# Patient Record
Sex: Male | Born: 1970 | Race: Black or African American | Hispanic: No | Marital: Married | Smoking: Former smoker
Health system: Southern US, Community
[De-identification: ages and names within clinical notes are randomized; demographics above are authoritative.]

## PROBLEM LIST (undated history)

## (undated) DIAGNOSIS — M25562 Pain in left knee: Secondary | ICD-10-CM

## (undated) DIAGNOSIS — E079 Disorder of thyroid, unspecified: Secondary | ICD-10-CM

## (undated) DIAGNOSIS — G4733 Obstructive sleep apnea (adult) (pediatric): Secondary | ICD-10-CM

## (undated) DIAGNOSIS — E039 Hypothyroidism, unspecified: Secondary | ICD-10-CM

## (undated) DIAGNOSIS — K5792 Diverticulitis of intestine, part unspecified, without perforation or abscess without bleeding: Secondary | ICD-10-CM

## (undated) DIAGNOSIS — E669 Obesity, unspecified: Secondary | ICD-10-CM

## (undated) DIAGNOSIS — A630 Anogenital (venereal) warts: Secondary | ICD-10-CM

## (undated) DIAGNOSIS — M5136 Other intervertebral disc degeneration, lumbar region: Secondary | ICD-10-CM

## (undated) DIAGNOSIS — I219 Acute myocardial infarction, unspecified: Secondary | ICD-10-CM

## (undated) DIAGNOSIS — I10 Essential (primary) hypertension: Secondary | ICD-10-CM

## (undated) DIAGNOSIS — H548 Legal blindness, as defined in USA: Secondary | ICD-10-CM

## (undated) DIAGNOSIS — M25561 Pain in right knee: Secondary | ICD-10-CM

## (undated) DIAGNOSIS — R569 Unspecified convulsions: Secondary | ICD-10-CM

## (undated) DIAGNOSIS — G5600 Carpal tunnel syndrome, unspecified upper limb: Secondary | ICD-10-CM

## (undated) DIAGNOSIS — M51369 Other intervertebral disc degeneration, lumbar region without mention of lumbar back pain or lower extremity pain: Secondary | ICD-10-CM

## (undated) DIAGNOSIS — I2510 Atherosclerotic heart disease of native coronary artery without angina pectoris: Secondary | ICD-10-CM

## (undated) DIAGNOSIS — K219 Gastro-esophageal reflux disease without esophagitis: Secondary | ICD-10-CM

## (undated) DIAGNOSIS — E785 Hyperlipidemia, unspecified: Secondary | ICD-10-CM

## (undated) DIAGNOSIS — Z720 Tobacco use: Secondary | ICD-10-CM

## (undated) DIAGNOSIS — E559 Vitamin D deficiency, unspecified: Secondary | ICD-10-CM

## (undated) HISTORY — DX: Carpal tunnel syndrome, unspecified upper limb: G56.00

## (undated) HISTORY — DX: Gastro-esophageal reflux disease without esophagitis: K21.9

## (undated) HISTORY — DX: Obesity, unspecified: E66.9

## (undated) HISTORY — DX: Other intervertebral disc degeneration, lumbar region without mention of lumbar back pain or lower extremity pain: M51.369

## (undated) HISTORY — DX: Pain in right knee: M25.561

## (undated) HISTORY — DX: Hyperlipidemia, unspecified: E78.5

## (undated) HISTORY — DX: Vitamin D deficiency, unspecified: E55.9

## (undated) HISTORY — DX: Essential (primary) hypertension: I10

## (undated) HISTORY — DX: Legal blindness, as defined in USA: H54.8

## (undated) HISTORY — DX: Unspecified convulsions: R56.9

## (undated) HISTORY — DX: Acute myocardial infarction, unspecified: I21.9

## (undated) HISTORY — DX: Obstructive sleep apnea (adult) (pediatric): G47.33

## (undated) HISTORY — DX: Anogenital (venereal) warts: A63.0

## (undated) HISTORY — DX: Atherosclerotic heart disease of native coronary artery without angina pectoris: I25.10

## (undated) HISTORY — DX: Disorder of thyroid, unspecified: E07.9

## (undated) HISTORY — PX: KNEE SURGERY: SHX244

## (undated) HISTORY — DX: Other intervertebral disc degeneration, lumbar region: M51.36

## (undated) HISTORY — DX: Tobacco use: Z72.0

## (undated) HISTORY — DX: Pain in left knee: M25.562

---

## 1999-09-16 ENCOUNTER — Inpatient Hospital Stay (HOSPITAL_COMMUNITY): Admission: EM | Admit: 1999-09-16 | Discharge: 1999-09-25 | Payer: Self-pay | Admitting: Emergency Medicine

## 1999-09-16 ENCOUNTER — Encounter: Payer: Self-pay | Admitting: Emergency Medicine

## 1999-09-17 ENCOUNTER — Encounter: Payer: Self-pay | Admitting: Internal Medicine

## 1999-09-18 ENCOUNTER — Encounter: Payer: Self-pay | Admitting: Internal Medicine

## 1999-09-19 ENCOUNTER — Encounter: Payer: Self-pay | Admitting: Internal Medicine

## 1999-09-21 ENCOUNTER — Encounter: Payer: Self-pay | Admitting: Internal Medicine

## 1999-09-28 ENCOUNTER — Encounter: Admission: RE | Admit: 1999-09-28 | Discharge: 1999-09-28 | Payer: Self-pay | Admitting: Family Medicine

## 1999-10-01 ENCOUNTER — Encounter: Admission: RE | Admit: 1999-10-01 | Discharge: 1999-10-01 | Payer: Self-pay | Admitting: Family Medicine

## 1999-10-06 ENCOUNTER — Encounter: Admission: RE | Admit: 1999-10-06 | Discharge: 1999-10-06 | Payer: Self-pay | Admitting: Family Medicine

## 1999-12-30 ENCOUNTER — Encounter: Admission: RE | Admit: 1999-12-30 | Discharge: 1999-12-30 | Payer: Self-pay | Admitting: Family Medicine

## 1999-12-30 ENCOUNTER — Encounter: Admission: RE | Admit: 1999-12-30 | Discharge: 1999-12-30 | Payer: Self-pay | Admitting: *Deleted

## 1999-12-30 ENCOUNTER — Encounter: Payer: Self-pay | Admitting: *Deleted

## 2000-02-16 ENCOUNTER — Encounter: Admission: RE | Admit: 2000-02-16 | Discharge: 2000-02-16 | Payer: Self-pay | Admitting: Family Medicine

## 2000-05-13 ENCOUNTER — Encounter: Admission: RE | Admit: 2000-05-13 | Discharge: 2000-05-13 | Payer: Self-pay | Admitting: Family Medicine

## 2000-07-14 ENCOUNTER — Encounter: Admission: RE | Admit: 2000-07-14 | Discharge: 2000-07-14 | Payer: Self-pay | Admitting: Family Medicine

## 2000-09-12 ENCOUNTER — Inpatient Hospital Stay (HOSPITAL_COMMUNITY): Admission: EM | Admit: 2000-09-12 | Discharge: 2000-09-14 | Payer: Self-pay | Admitting: Emergency Medicine

## 2000-09-13 ENCOUNTER — Encounter: Payer: Self-pay | Admitting: *Deleted

## 2000-09-13 ENCOUNTER — Encounter: Payer: Self-pay | Admitting: Family Medicine

## 2000-10-26 ENCOUNTER — Encounter: Admission: RE | Admit: 2000-10-26 | Discharge: 2000-10-26 | Payer: Self-pay | Admitting: Family Medicine

## 2000-11-09 ENCOUNTER — Encounter: Admission: RE | Admit: 2000-11-09 | Discharge: 2000-11-09 | Payer: Self-pay | Admitting: Family Medicine

## 2001-02-27 ENCOUNTER — Encounter: Admission: RE | Admit: 2001-02-27 | Discharge: 2001-02-27 | Payer: Self-pay | Admitting: Family Medicine

## 2001-03-15 ENCOUNTER — Encounter: Admission: RE | Admit: 2001-03-15 | Discharge: 2001-03-15 | Payer: Self-pay | Admitting: Family Medicine

## 2001-06-26 ENCOUNTER — Encounter: Admission: RE | Admit: 2001-06-26 | Discharge: 2001-06-26 | Payer: Self-pay | Admitting: Family Medicine

## 2001-07-11 ENCOUNTER — Encounter: Admission: RE | Admit: 2001-07-11 | Discharge: 2001-07-11 | Payer: Self-pay | Admitting: Sports Medicine

## 2001-09-06 ENCOUNTER — Encounter: Admission: RE | Admit: 2001-09-06 | Discharge: 2001-09-06 | Payer: Self-pay | Admitting: Family Medicine

## 2001-10-18 ENCOUNTER — Encounter: Admission: RE | Admit: 2001-10-18 | Discharge: 2001-10-18 | Payer: Self-pay | Admitting: Family Medicine

## 2002-04-16 ENCOUNTER — Encounter: Admission: RE | Admit: 2002-04-16 | Discharge: 2002-04-16 | Payer: Self-pay | Admitting: Family Medicine

## 2003-03-20 ENCOUNTER — Encounter: Admission: RE | Admit: 2003-03-20 | Discharge: 2003-03-20 | Payer: Self-pay | Admitting: Family Medicine

## 2003-07-24 ENCOUNTER — Encounter: Admission: RE | Admit: 2003-07-24 | Discharge: 2003-07-24 | Payer: Self-pay | Admitting: Family Medicine

## 2003-11-21 ENCOUNTER — Encounter: Admission: RE | Admit: 2003-11-21 | Discharge: 2003-11-21 | Payer: Self-pay | Admitting: Family Medicine

## 2004-09-28 ENCOUNTER — Ambulatory Visit: Payer: Self-pay | Admitting: Family Medicine

## 2005-07-02 ENCOUNTER — Ambulatory Visit: Payer: Self-pay | Admitting: Family Medicine

## 2005-07-30 ENCOUNTER — Ambulatory Visit: Payer: Self-pay | Admitting: Family Medicine

## 2005-11-30 ENCOUNTER — Ambulatory Visit: Payer: Self-pay | Admitting: Family Medicine

## 2005-12-01 ENCOUNTER — Ambulatory Visit (HOSPITAL_COMMUNITY): Admission: RE | Admit: 2005-12-01 | Discharge: 2005-12-01 | Payer: Self-pay | Admitting: Sports Medicine

## 2005-12-01 ENCOUNTER — Ambulatory Visit: Payer: Self-pay | Admitting: *Deleted

## 2005-12-21 ENCOUNTER — Ambulatory Visit (HOSPITAL_COMMUNITY): Admission: RE | Admit: 2005-12-21 | Discharge: 2005-12-21 | Payer: Self-pay | Admitting: Family Medicine

## 2005-12-21 ENCOUNTER — Ambulatory Visit: Payer: Self-pay | Admitting: Sports Medicine

## 2006-06-23 DIAGNOSIS — G4730 Sleep apnea, unspecified: Secondary | ICD-10-CM | POA: Insufficient documentation

## 2006-06-23 DIAGNOSIS — K219 Gastro-esophageal reflux disease without esophagitis: Secondary | ICD-10-CM | POA: Insufficient documentation

## 2006-06-23 DIAGNOSIS — E785 Hyperlipidemia, unspecified: Secondary | ICD-10-CM | POA: Insufficient documentation

## 2006-06-23 DIAGNOSIS — I10 Essential (primary) hypertension: Secondary | ICD-10-CM | POA: Insufficient documentation

## 2006-06-23 DIAGNOSIS — M1710 Unilateral primary osteoarthritis, unspecified knee: Secondary | ICD-10-CM | POA: Insufficient documentation

## 2006-06-23 DIAGNOSIS — G5600 Carpal tunnel syndrome, unspecified upper limb: Secondary | ICD-10-CM | POA: Insufficient documentation

## 2006-06-23 DIAGNOSIS — I517 Cardiomegaly: Secondary | ICD-10-CM | POA: Insufficient documentation

## 2006-06-23 DIAGNOSIS — IMO0002 Reserved for concepts with insufficient information to code with codable children: Secondary | ICD-10-CM | POA: Insufficient documentation

## 2006-06-23 DIAGNOSIS — E669 Obesity, unspecified: Secondary | ICD-10-CM | POA: Insufficient documentation

## 2006-06-23 DIAGNOSIS — M6530 Trigger finger, unspecified finger: Secondary | ICD-10-CM | POA: Insufficient documentation

## 2006-08-15 ENCOUNTER — Telehealth: Payer: Self-pay | Admitting: *Deleted

## 2006-09-16 ENCOUNTER — Encounter: Payer: Self-pay | Admitting: *Deleted

## 2007-01-03 ENCOUNTER — Telehealth: Payer: Self-pay | Admitting: *Deleted

## 2007-02-23 ENCOUNTER — Ambulatory Visit: Payer: Self-pay | Admitting: Family Medicine

## 2007-02-23 ENCOUNTER — Encounter (INDEPENDENT_AMBULATORY_CARE_PROVIDER_SITE_OTHER): Payer: Self-pay | Admitting: *Deleted

## 2007-02-23 DIAGNOSIS — M109 Gout, unspecified: Secondary | ICD-10-CM | POA: Insufficient documentation

## 2007-02-23 DIAGNOSIS — F17200 Nicotine dependence, unspecified, uncomplicated: Secondary | ICD-10-CM | POA: Insufficient documentation

## 2007-02-23 DIAGNOSIS — Z720 Tobacco use: Secondary | ICD-10-CM | POA: Insufficient documentation

## 2007-02-24 LAB — CONVERTED CEMR LAB
ALT: 16 units/L (ref 0–53)
BUN: 10 mg/dL (ref 6–23)
CO2: 26 meq/L (ref 19–32)
Calcium: 9.2 mg/dL (ref 8.4–10.5)
Chloride: 102 meq/L (ref 96–112)
Cholesterol: 191 mg/dL (ref 0–200)
Creatinine, Ser: 1.13 mg/dL (ref 0.40–1.50)
Glucose, Bld: 80 mg/dL (ref 70–99)
Total CHOL/HDL Ratio: 5.8
Triglycerides: 157 mg/dL — ABNORMAL HIGH (ref <150)

## 2007-03-13 ENCOUNTER — Ambulatory Visit: Payer: Self-pay | Admitting: Family Medicine

## 2007-04-04 ENCOUNTER — Encounter (INDEPENDENT_AMBULATORY_CARE_PROVIDER_SITE_OTHER): Payer: Self-pay | Admitting: *Deleted

## 2007-04-04 ENCOUNTER — Ambulatory Visit: Payer: Self-pay | Admitting: Family Medicine

## 2007-04-04 DIAGNOSIS — A630 Anogenital (venereal) warts: Secondary | ICD-10-CM | POA: Insufficient documentation

## 2007-04-04 LAB — CONVERTED CEMR LAB

## 2007-04-07 ENCOUNTER — Telehealth (INDEPENDENT_AMBULATORY_CARE_PROVIDER_SITE_OTHER): Payer: Self-pay | Admitting: *Deleted

## 2007-04-07 ENCOUNTER — Encounter (INDEPENDENT_AMBULATORY_CARE_PROVIDER_SITE_OTHER): Payer: Self-pay | Admitting: *Deleted

## 2007-04-27 ENCOUNTER — Encounter: Payer: Self-pay | Admitting: Family Medicine

## 2007-05-08 ENCOUNTER — Encounter: Payer: Self-pay | Admitting: *Deleted

## 2007-08-18 ENCOUNTER — Encounter (INDEPENDENT_AMBULATORY_CARE_PROVIDER_SITE_OTHER): Payer: Self-pay | Admitting: *Deleted

## 2007-08-18 ENCOUNTER — Ambulatory Visit: Payer: Self-pay | Admitting: Family Medicine

## 2007-08-21 LAB — CONVERTED CEMR LAB
ALT: 16 units/L (ref 0–53)
CO2: 25 meq/L (ref 19–32)
Calcium: 9.2 mg/dL (ref 8.4–10.5)
Chloride: 105 meq/L (ref 96–112)
Creatinine, Ser: 1.13 mg/dL (ref 0.40–1.50)
Glucose, Bld: 82 mg/dL (ref 70–99)
Total Protein: 6.9 g/dL (ref 6.0–8.3)
Uric Acid, Serum: 8.8 mg/dL — ABNORMAL HIGH (ref 4.0–7.8)

## 2007-09-11 ENCOUNTER — Ambulatory Visit (HOSPITAL_COMMUNITY): Admission: RE | Admit: 2007-09-11 | Discharge: 2007-09-11 | Payer: Self-pay | Admitting: *Deleted

## 2007-09-11 ENCOUNTER — Encounter (INDEPENDENT_AMBULATORY_CARE_PROVIDER_SITE_OTHER): Payer: Self-pay | Admitting: *Deleted

## 2007-09-26 ENCOUNTER — Encounter (INDEPENDENT_AMBULATORY_CARE_PROVIDER_SITE_OTHER): Payer: Self-pay | Admitting: *Deleted

## 2007-10-02 ENCOUNTER — Encounter (INDEPENDENT_AMBULATORY_CARE_PROVIDER_SITE_OTHER): Payer: Self-pay | Admitting: *Deleted

## 2007-10-23 ENCOUNTER — Encounter: Payer: Self-pay | Admitting: *Deleted

## 2008-06-05 ENCOUNTER — Telehealth: Payer: Self-pay | Admitting: *Deleted

## 2010-01-13 ENCOUNTER — Encounter: Payer: Self-pay | Admitting: Family Medicine

## 2010-04-16 ENCOUNTER — Encounter: Payer: Self-pay | Admitting: Family Medicine

## 2010-05-26 NOTE — Letter (Signed)
Summary: rpc chart  rpc chart   Imported By: Curtis Sites 02/05/2010 15:26:43  _____________________________________________________________________  External Attachment:    Type:   Image     Comment:   External Document

## 2010-05-26 NOTE — Miscellaneous (Signed)
Summary: Changing Dx of CHF   Clinical Lists Changes  Problems: Changed problem from CHF, EJECTION FRACTION > OR = 50% (ICD-428.32) to CHRONIC DIASTOLIC HEART FAILURE (ICD-428.32)

## 2010-05-28 NOTE — Miscellaneous (Signed)
  Clinical Lists Changes  Problems: Removed problem of VISUAL DISTURBANCE NOS (ICD-368.9)

## 2010-09-11 NOTE — Discharge Summary (Signed)
[place]  Patient:    Mike Davenport, Mike Davenport                   MRN: [id number] Adm. Date:  [id number] Disc. Date: [id number] Attending:  Sanjuana Letters Dictator:   Zella Ball, M.D. CC:         Lyndee Leo. Janey Greaser, M.D., [facility]   Discharge Summary  DISCHARGE DIAGNOSES: 1. Hypertensive urgency. 2. Hypertension, essential. 3. History of congestive heart failure. 4. Obstructive sleep apnea. 5. Pulmonary hypertension secondary to #4.  DISCHARGE MEDICATIONS: 1. Lasix 80 mg b.i.d. 2. Lopressor 50 mg p.o. b.i.d. 3. Ramipril 10 mg p.o. q.d. 4. Cardizem CD 300 mg q.d.  SPECIAL INSTRUCTIONS:  Patient is to continue on his home BiPAP.  FOLLOW-UP:  The patient is to follow up at the [facility].  PRESENTING HISTORY:  This is a 40 year old African-American male with past medical history as above who was out walking on the day of presentation with his girlfriend when he began experiencing some chest pain.  He stopped walking and when he sat down the chest pain intensified, lasted about five to 10 minutes with shortness of breath and headache.  Chest pain was initially relieved with nitroglycerin but the patient continued to have headache on presentation to the ED.  The patient actually presented to [facility] first via ambulance and on presentation to [facility] he was found to have blood pressure of 225/125.  On presentation to [facility] the patient stated that he would like to come to [facility], therefore, he was transferred by ambulance to our care on a nitroprusside drip.  PHYSICAL EXAMINATION ON PRESENTATION:  GENERAL APPEARANCE:  This is an obese black male in mild distress.  VITAL SIGNS:  Afebrile at 96.9, blood pressure 153/87, respiratory rate 22, pulse 93, satting 96% on two liters.  HEENT:  Fundi were not well visualized.  LUNGS:  There was  good air exchange, no crackles, good respiratory effort.  CARDIOVASCULAR:  Regular rate and rhythm.  EXTREMITIES:  There was no edema.  NEUROLOGICAL:  Cranial nerves II-XII grossly intact.  Strength 5/5 throughout and normal tone.  INITIAL LABS:  Initial cardiac enzymes slightly elevated with relative index, CK 163, CK-MB 5.3, relative index 3.2, troponin I of 0.04.  Second set of enzymes performed in the ED as well.  Negative troponin I of 0.04.  Sodium 138, potassium 3.5, chloride 110, bicarb 26, BUN 14, creatinine 1.4, glucose 156.  White count 11.8, hemoglobin 16, platelets 223.  Liver enzymes were normal.  Chest x-ray showed pulmonary edema.  EKG showed normal sinus rhythm without any obvious ST elevations or depressions.  CT of the head was performed and was negative for bleed or for cerebral edema.  ASSESSMENT:  A 40 year old African-American male with a history of congestive heart failure and hypertension who had been off of his Lopressor, his cardiac medications for three days and presented with clinical picture concerning for hypertensive emergency/urgency.  Therefore, the patient was admitted for management of this event.  HOSPITAL COURSE:  #1 - HYPERTENSIVE URGENCY:  The patient was changed from nitroprusside to a labetalol drip while at [facility].  He seemed to respond to this quite well.  His blood pressures came under control and as tolerated, he was switched from the labetalol drip to an oral regimen similar to the one he went home on and his blood pressure  seemed to normalize with this.  The patients headache eventually went away. As stated above, he did not have any signs of cerebral edema by CT of the head and as his blood pressures did normalize, he did quite well.  #2 - PULMONARY EDEMA:  The patient was begun on IV Lasix q.6h., continuous pulse-ox and he also responded to this quite well.  He was able to wean down to room air, had  vigorous urine output and repeat chest x-ray the day after presentation was without pulmonary edema.  #3 - PROTEINURIA: The patient had a significant amount of proteinuria in his urine.  Secondary to this, autoimmune work-up was done on him and he had a UPEP and an SPEP performed, the results of which were pending at this time as well as 24-hour analysis for metanephrines to rule out the possibility of _______________  and those results were pending at the time of the patients discharge.  DISPOSITION:  The patient was discharged to home in good health and advised strongly not to run out of his medications as this event was likely precipitated by his not taking his Lopressor.  DD:  10/15/00 TD:  10/17/00 Job: [id number]

## 2010-09-11 NOTE — Discharge Summary (Signed)
[place]. [facility]  Patient:    Mike Davenport, Mike Davenport                   MRN: [id number] Adm. Date:  [id number] Disc. Date: [id number] Attending:  Madaline Guthrie Dictator:   Lyndee Leo. Foreman                           Discharge Summary  CHIEF COMPLAINT:  Mr. Betty is a 40 year old African-American male who came in with a chief complaint of dyspnea and increased fluid on his legs.  HISTORY OF PRESENT ILLNESS:  ______ -year-old male with above chief complaint who states problems have been ongoing for about one year.  The patient was seen four days ago in the emergency department in [place], [place], and was released back home.  The patient was not satisfied with his care there and, thus, decided to come to [facility].  The patient states he gets short of breath with any type of minimal exertion.  Over the last three weeks the patient has had worsening dyspnea on exertion.  The patients last known weight was 506; on June 25, 1999, it was 350.  The patient states he has to sleep sitting up.  He also does ______ quite frequently during the day.  The patient states his normal weight is 275-280 pounds.  PAST MEDICAL HISTORY: 1. Hypertension. 2. Sleep apnea. 3. Obesity. 4. Decreased vision in his left eye, which is now absent, which has occurred    over the last three to four months.  PAST SURGICAL HISTORY:  Orthopedic leg surgery bilaterally in his teen years because one leg was longer than the other.  MEDICATIONS: 1. Diltiazem 300 mg p.o. q.d. 2. Lasix 40 mg p.o. q.d. 3. Altace 5 mg p.o. q.d. 4. Atenolol 25 mg p.o. q.d.  ALLERGIES:  No known drug allergies.  FAMILY HISTORY:  Positive for hypertension and diabetes.  SOCIAL HISTORY:  Denies alcohol or drug use.  Does smoke one pack a day over the last 13 years.  The patient lives with his girlfriend.  Is unemployed secondary to his weight.  PHYSICAL EXAMINATION:  VITAL SIGNS:  Temperature 99.8,  respirations 26, pulse 102, blood pressure 164/97.  He is 92% on 2 L.  GENERAL:  The patient is sitting on the edge of the bed, dyspneic with exertion up to 5-10 feet in distance.  Answers questions appropriately.  Alert and oriented x 3.  HEENT:  Normocephalic, atraumatic.  Oropharynx is clear.  No erythema, no exudate.  Left eye:  No visual capability, only blurred shadows.  No nasal discharge.  CARDIOVASCULAR:  Very limited secondary to his obesity.  Faint heart sounds. No peripheral pulses secondary to increased size of extremities.  LUNGS:  Faint breath sounds.  Decreased aeration, however, very limited secondary to his size.  ABDOMEN:  Obese.  Decreased bowel sounds.  Nontender.  EXTREMITIES:  He has extreme edema in both lower legs bilaterally.  The patient has undergo a 150-pound weight gain over the last three months.  LABORATORY DATA:  Chest x-ray revealed CHF with mild edema.  EKG revealed biatrial enlargement.  No ST elevation.  ASSESSMENT:  [age]-year-old African-American male with morbid obesity and probable right heart failure with fluid overload.  PLAN:  Increased diuresis.  The patient also would probably benefit from physical therapy and rehabilitation.  Will get nutrition on board, check an echo for cardiac function, and also discuss possible  psychiatric issues with his obesity.  Will obtain an ophthalmology consult.  HOSPITAL COURSE:  Over the hospital course the patient had sustained symptoms with worrisome ABGs.  An initial ABG was obtained with a pH of 7.27, CO2 of 83.5, O2 72.9, bicarbonate 38.4, and 92% on 4 L.  The patient had a urine drug screen which was negative.  He ruled out from a cardiac standpoint.  The patients acidemia was felt to be secondary to hypoventilation of obesity, and the patient exhibited a metabolic compensation for this.  He was transferred to the stepdown unit for BiPAP at night and serial ABGs to keep close tabs on him  from a metabolic standpoint.  The patient was placed on Lovenox for increased risk for DVT.  Serial blood gases over hospitalization gradually improved.  The patient slowly returned back to normal levels with continuous BiPAP at night.  The patients last ABG before discharge revealed 7.38, 62, and 63 on room air.  The patient was discharged on September 26, 1999, with the following issues.  DISCHARGE DIAGNOSES: 1. Morbid obesity:  I spoke at length regarding this.  The patient was given    specific goals for weight loss and exercise.  Also contacted the obesity    surgeons at both [facility] and [facility] regarding possible stapling of the stomach if    behavior change is not able to get enough of his weight loss off. 2. Respiratory:  The patient will be placed on BiPAP at home and was told that    he needs to wear this every night. 3. Lack of primary medical doctor:  The patient is to follow up with    Dr. Janey Greaser in the family practice center. 4. History of hematuria over hospitalization:  I felt this was likely    secondary to some trauma induced by the Foley, as well as being placed on    anticoagulation.  Will continue to monitor this closely on an outpatient    basis. 5. The patient was placed on chronic treatment, given his considerable risk    for DVT and pulmonary embolus, given morbid obesity.  Coumadin levels will    be checked as an outpatient. 6. Financial:  The patient has Medicaid pending and may likely be a candidate    for disability secondary to his obesity.  However, we feel that    Mr. Habig would benefit most from getting his weight down and trying to    get back to work. 7. Ophthalmology:  Consult pending.  Was not done at the time of discharge,    and the patient will need to follow up after being discharged to evaluate    his eye situation.  DISCHARGE MEDICATIONS: 1. Lasix 120 mg p.o. t.i.d. 2. Cipro 500 mg p.o. b.i.d. to finish a seven-day course for a urinary tract     infection. 3. Coumadin 10 mg p.o. q.d. 4. K-Dur 40 mEq p.o. q.d. 5. Ramipril 10 mg p.o. q.d. 6. Metoprolol 50 mg p.o. b.i.d.  7. Diltiazem 300 mg p.o. q.d.  LABORATORY DATA:  The patient had an echocardiogram.  Left ventricle was mildly dilated.  There is anterior wall hypokinesis and lateral wall hypokinesis.  LV ejection fraction was 35-45%.  Left atrium mildly dilated. The patient had trace tricuspid regurgitation.  The study was limited, however, given the patients size. DD:  11/09/99 TD:  11/09/99 Job: [id number]

## 2010-09-11 NOTE — Procedures (Signed)
NAMEFLAY, GHOSH NO.:  [id number]   MEDICAL RECORD NO.:  [id number]          PATIENT TYPE:  OUT   LOCATION:  RAD                           FACILITY:  [facility]   PHYSICIAN:  Vida Roller, M.D.   DATE OF BIRTH:  November 03, 1970   DATE OF PROCEDURE:  12/01/2005  DATE OF DISCHARGE:                                  ECHOCARDIOGRAM   TAPE NUMBER:  [id number].   TAPE COUNT:  [id number].   This is a 40 year old man with congestive heart failure.  No previous echo  is available.  Technical quality of the study is somewhat difficult, poor  acoustic windows.   M-MODE TRACING:  The aorta is 37 mm.   Left atrium is 40 mm.   Septum is 13 mm.   Posterior wall is 13 mm.   Left ventricular diastolic dimension 45 mm.   Left ventricular systolic dimension 32 mm.   2-D AND DOPPLER IMAGING:  The left ventricle is normal size.  There is  preserved LV systolic function with an ejection fraction of 60-65%.  No wall  motion abnormalities are seen.  There is mild concentric left ventricular  hypertrophy.   The right ventricle appears to be normal size with normal systolic function.   Left atrium appeared to top normal in size.   There is no obvious valvular heart disease by Doppler profile.   There is no pericardial effusion.      Vida Roller, M.D.  Electronically Signed     JH/MEDQ  D:  12/01/2005  T:  12/01/2005  Job:  [id number]

## 2011-04-07 ENCOUNTER — Ambulatory Visit (INDEPENDENT_AMBULATORY_CARE_PROVIDER_SITE_OTHER): Payer: Medicare Other | Admitting: Family Medicine

## 2011-04-07 ENCOUNTER — Encounter: Payer: Self-pay | Admitting: Family Medicine

## 2011-04-07 DIAGNOSIS — I5030 Unspecified diastolic (congestive) heart failure: Secondary | ICD-10-CM

## 2011-04-07 DIAGNOSIS — R0989 Other specified symptoms and signs involving the circulatory and respiratory systems: Secondary | ICD-10-CM

## 2011-04-07 DIAGNOSIS — I509 Heart failure, unspecified: Secondary | ICD-10-CM

## 2011-04-07 DIAGNOSIS — M109 Gout, unspecified: Secondary | ICD-10-CM

## 2011-04-07 DIAGNOSIS — Z720 Tobacco use: Secondary | ICD-10-CM

## 2011-04-07 DIAGNOSIS — F17200 Nicotine dependence, unspecified, uncomplicated: Secondary | ICD-10-CM

## 2011-04-07 DIAGNOSIS — R0609 Other forms of dyspnea: Secondary | ICD-10-CM

## 2011-04-07 DIAGNOSIS — I10 Essential (primary) hypertension: Secondary | ICD-10-CM

## 2011-04-07 DIAGNOSIS — Z0000 Encounter for general adult medical examination without abnormal findings: Secondary | ICD-10-CM

## 2011-04-07 DIAGNOSIS — IMO0002 Reserved for concepts with insufficient information to code with codable children: Secondary | ICD-10-CM

## 2011-04-07 DIAGNOSIS — M1710 Unilateral primary osteoarthritis, unspecified knee: Secondary | ICD-10-CM

## 2011-04-07 DIAGNOSIS — I5032 Chronic diastolic (congestive) heart failure: Secondary | ICD-10-CM

## 2011-04-07 DIAGNOSIS — E785 Hyperlipidemia, unspecified: Secondary | ICD-10-CM

## 2011-04-07 DIAGNOSIS — E669 Obesity, unspecified: Secondary | ICD-10-CM

## 2011-04-07 DIAGNOSIS — G4730 Sleep apnea, unspecified: Secondary | ICD-10-CM

## 2011-04-07 LAB — CBC
MCHC: 33.8 g/dL (ref 30.0–36.0)
RDW: 15.5 % (ref 11.5–15.5)

## 2011-04-07 LAB — COMPREHENSIVE METABOLIC PANEL
Alkaline Phosphatase: 46 U/L (ref 39–117)
BUN: 10 mg/dL (ref 6–23)
Glucose, Bld: 83 mg/dL (ref 70–99)
Total Bilirubin: 0.4 mg/dL (ref 0.3–1.2)

## 2011-04-07 LAB — LIPID PANEL
HDL: 35 mg/dL — ABNORMAL LOW (ref >39)
LDL Cholesterol: 79 mg/dL (ref 0–99)
Total CHOL/HDL Ratio: 4.1 Ratio
Triglycerides: 155 mg/dL — ABNORMAL HIGH (ref <150)
VLDL: 31 mg/dL (ref 0–40)

## 2011-04-07 LAB — URIC ACID: Uric Acid, Serum: 8 mg/dL — ABNORMAL HIGH (ref 4.0–7.8)

## 2011-04-07 MED ORDER — NICOTINE POLACRILEX 4 MG MT GUM
4.0000 mg | CHEWING_GUM | OROMUCOSAL | Status: AC | PRN
Start: 2011-04-07 — End: 2011-05-07

## 2011-04-07 MED ORDER — NICOTINE 14 MG/24HR TD PT24: 1.0000 | MEDICATED_PATCH | TRANSDERMAL | Status: AC

## 2011-04-07 NOTE — Assessment & Plan Note (Signed)
Pt with multiple medical problems. Will perform secondary monitoring labs. Broached issue of importance of smoking cessation and proper diet. Pt has numerous medical problems that will need to be dealt with over the coming months.

## 2011-04-07 NOTE — Assessment & Plan Note (Signed)
Pt ready to quit. Will start on on nicoderm and nicorette.

## 2011-04-07 NOTE — Assessment & Plan Note (Signed)
Pt states that he is on allopurinol and prn colchicine. Will check uric acid today.

## 2011-04-07 NOTE — Assessment & Plan Note (Signed)
Checking Cr and K today. Waiting on meds from pharmacy to formally add to chart.

## 2011-04-07 NOTE — Assessment & Plan Note (Signed)
Stressed importance of weight loss. Checking lipids today.

## 2011-04-07 NOTE — Assessment & Plan Note (Signed)
Split night ordered to titrate CPAP settings.

## 2011-04-07 NOTE — Assessment & Plan Note (Signed)
Checking lipids today. Pending med info from pharmacy.

## 2011-04-07 NOTE — Progress Notes (Signed)
  Subjective:    Patient ID: Mike Davenport, male    DOB: 23-Feb-1971, 40 y.o.   MRN: [id number]  HPI 22 YOM with multiple medical issues including HTN, HLD, morbid obesity, diastolic HF, OSA here to reestablish care. Pt was last seen here in 2009.  Pt states that he recently moved back to the area from [place]. 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   HTN: Pt states that he has not been checking BPs daily. Pt denies any CP or SOB. Pt does report intermittent HA. Pt also reports high salt diet. Pt can remember that he is on lisinopril, but he is unsure of dose.   OSA: pt has a history of this. Pt states that he has not worn his CPAP for several months as settings are off. Last sleep study was greater than 5 years ago per pt.   HLD: pt is unsure if he is on medication for HLD. Pt does report eating a high fat diet, including eating multiple snacks from a vending machine at work on a daily basis.   Diastolic HF: Pt states that he was evaluated for this in [place]. Per pt, he had a "normal" ECHO/?cardicac catheterization. Denies any orthopnea, PND, or DOE. Pt has not been following weights. Pt also reports relatively high sodium diet.   Knee: Pt has a baseline hx/o chronic knee pain. Pt states that knee pain has been present since having bilateral knee surgery when he was 14. Pt states that he walks all day at work and pain seems to be exacerbated at end of working. Pt states that he has not tried to work on diet or weight loss for this. Pt has used naproxen with mild relief in sxs. Pt has not seen PT for this. Pain is mainly in knee joints bilaterally.   Gout: Pt also reports hx/o gout. Pt states that he is on allopurinol for this. Pt states that gout flares are mainly in ankles and are usually relieved with colchicine. Pt has used colchicine within the last year.   Pt is aware of some of his medicines but did not bring list today. Pt gave phone # of pharmacy to call.   Review  of Systems See HPI, otherwise 12 point ROS negatve.     Objective:   Physical Exam Gen: up in chair, NAD, morbidly obese  HEENT: NCAT,+ strabsimus involving L eye, TMs clear bilaterally, large neck girth CV: RRR, no murmurs auscultated PULM: CTAB, no wheezes, rales, rhoncii ABD: S/NT/+ bowel sounds  EXT: 2+ peripheral pulses , 1+ edema bilaterally  Assessment & Plan:

## 2011-04-07 NOTE — Patient Instructions (Signed)
BE SURE TO COME BACK TO SEE ME IN 1 MONTH Obesity Obesity is defined as having a body mass index (BMI) of 30 or more. To calculate your BMI divide your weight in pounds by your height in inches squared and multiply that product by 703. Major illnesses resulting from long-term obesity include:  Stroke.   Heart disease.   Diabetes.   Many cancers.   Arthritis.  Obesity also complicates recovery from many other medical problems.  CAUSES   A history of obesity in your parents.   Thyroid hormone imbalance.   Environmental factors such as excess calorie intake and physical inactivity.  TREATMENT  A healthy weight loss program includes:  A calorie restricted diet based on individual calorie needs.   Increased physical activity (exercise).  An exercise program is just as important as the right low-calorie diet.  Weight-loss medicines should be used only under the supervision of your physician. These medicines help, but only if they are used with diet and exercise programs. Medicines can have side effects including nervousness, nausea, abdominal pain, diarrhea, headache, drowsiness, and depression.  An unhealthy weight loss program includes:  Fasting.   Fad diets.   Supplements and drugs.  These choices do not succeed in long-term weight control.  HOME CARE INSTRUCTIONS  To help you make the needed dietary changes:   Exercise and perform physical activity as directed by your caregiver.   Keep a daily record of everything you eat. There are many free websites to help you with this. It may be helpful to measure your foods so you can determine if you are eating the correct portion sizes.   Use low-calorie cookbooks or take special cooking classes.   Avoid alcohol. Drink more water and drinks with no calories.   Take vitamins and supplements only as recommended by your caregiver.   Weight loss support groups, Registered Dieticians, counselors, and stress reduction education can  also be very helpful.  Document Released: 05/20/2004 Document Revised: 12/23/2010 Document Reviewed: 03/19/2007 [facility] Patient Information 2012 [place], [place].

## 2011-04-07 NOTE — Assessment & Plan Note (Signed)
Checking BNP today as a baseline. No signs of florid volume overload. Though pt is morbidly obese at baseline. As stated before, waiting on med list to assess need for any treatment changes. Pt may benefit from repeat ECHO in the next 3-6 months to better assess this.

## 2011-04-07 NOTE — Assessment & Plan Note (Signed)
Stressed weight loss and prn tylenol use. Will follow up in coming visits to discuss this issue more in depth.

## 2011-04-08 ENCOUNTER — Encounter: Payer: Self-pay | Admitting: Family Medicine

## 2011-05-05 ENCOUNTER — Other Ambulatory Visit: Payer: Self-pay | Admitting: Family Medicine

## 2011-05-05 ENCOUNTER — Telehealth: Payer: Self-pay | Admitting: Family Medicine

## 2011-05-05 MED ORDER — ASPIRIN EC 81 MG PO TBEC: 81.0000 mg | DELAYED_RELEASE_TABLET | Freq: Every day | ORAL | Status: AC

## 2011-05-05 MED ORDER — SPIRONOLACTONE 25 MG PO TABS: 25.0000 mg | ORAL_TABLET | Freq: Every day | ORAL | Status: DC

## 2011-05-05 MED ORDER — METOPROLOL TARTRATE 100 MG PO TABS: 100.0000 mg | ORAL_TABLET | Freq: Two times a day (BID) | ORAL | Status: DC

## 2011-05-05 MED ORDER — LISINOPRIL 20 MG PO TABS: 20.0000 mg | ORAL_TABLET | Freq: Every day | ORAL | Status: DC

## 2011-05-05 MED ORDER — COLCHICINE 0.6 MG PO TABS: 0.6000 mg | ORAL_TABLET | Freq: Three times a day (TID) | ORAL | Status: DC | PRN

## 2011-05-05 MED ORDER — ALLOPURINOL 100 MG PO TABS: 200.0000 mg | ORAL_TABLET | Freq: Two times a day (BID) | ORAL | Status: DC

## 2011-05-05 MED ORDER — POTASSIUM CHLORIDE 20 MEQ PO PACK: 40.0000 meq | PACK | Freq: Every day | ORAL | Status: DC

## 2011-05-05 MED ORDER — AMLODIPINE BESYLATE 10 MG PO TABS: 10.0000 mg | ORAL_TABLET | Freq: Every day | ORAL | Status: DC

## 2011-05-05 MED ORDER — ESOMEPRAZOLE MAGNESIUM 40 MG PO CPDR: 40.0000 mg | DELAYED_RELEASE_CAPSULE | Freq: Every day | ORAL | Status: DC

## 2011-05-05 MED ORDER — TORSEMIDE 20 MG PO TABS: 40.0000 mg | ORAL_TABLET | Freq: Every day | ORAL | Status: DC

## 2011-05-05 NOTE — Telephone Encounter (Signed)
Mike Davenport is calling because he has had several medications that need to be refilled and his pharmacy has sent the requests but they have not been filled yet.

## 2011-05-05 NOTE — Telephone Encounter (Signed)
Patient states he needs refill on all BP meds and all other  meds . States the meds were in the records that he brought from [place]. Asked him to contact [facility] and have them fax over the refill request to [place]. Will forward to Dr. Alvester Morin

## 2011-05-06 NOTE — Telephone Encounter (Signed)
meds were manually placed. Please contact pt to inform him of this.

## 2011-05-07 ENCOUNTER — Ambulatory Visit: Payer: Medicare Other | Admitting: Family Medicine

## 2011-05-07 ENCOUNTER — Encounter (HOSPITAL_BASED_OUTPATIENT_CLINIC_OR_DEPARTMENT_OTHER): Payer: Medicare Other

## 2011-05-16 ENCOUNTER — Emergency Department (HOSPITAL_COMMUNITY): Admission: EM | Admit: 2011-05-16 | Discharge: 2011-05-16 | Payer: Self-pay | Source: Home / Self Care

## 2011-05-19 ENCOUNTER — Ambulatory Visit (INDEPENDENT_AMBULATORY_CARE_PROVIDER_SITE_OTHER): Payer: Medicare Other | Admitting: Family Medicine

## 2011-05-19 DIAGNOSIS — I10 Essential (primary) hypertension: Secondary | ICD-10-CM

## 2011-05-19 DIAGNOSIS — J069 Acute upper respiratory infection, unspecified: Secondary | ICD-10-CM

## 2011-05-19 DIAGNOSIS — M109 Gout, unspecified: Secondary | ICD-10-CM

## 2011-05-19 DIAGNOSIS — F17200 Nicotine dependence, unspecified, uncomplicated: Secondary | ICD-10-CM

## 2011-05-19 MED ORDER — LOSARTAN POTASSIUM 50 MG PO TABS: 50.0000 mg | ORAL_TABLET | Freq: Every day | ORAL | Status: DC

## 2011-05-19 NOTE — Patient Instructions (Signed)
It was good to see you today  You likely have viral infection Stopping smoking will help this feel better sooner I am switching your blood pressure medicine to help with your gout  Come back to see me in 2-4 weeks,  Call with any questions,  God Bless,  Doree Albee MD

## 2011-05-20 DIAGNOSIS — J069 Acute upper respiratory infection, unspecified: Secondary | ICD-10-CM | POA: Insufficient documentation

## 2011-05-20 NOTE — Assessment & Plan Note (Signed)
Likely viral in etiology. Discussed infectious red flags and supportive care. Handout given. Discussed smoking cessation as tobacco abuse will increase severity and duration of symptoms.

## 2011-05-20 NOTE — Assessment & Plan Note (Signed)
Discussed cessation. Pt not ready to quit.  

## 2011-05-20 NOTE — Assessment & Plan Note (Signed)
Uric Acid level > 6. Will transition from lisinopril to losartan to help with uricosuric properties. Recheck in 6 weeks.

## 2011-05-20 NOTE — Progress Notes (Signed)
  Subjective:    Patient ID: Mike Davenport, male    DOB: 04-May-1970, 41 y.o.   MRN: [id number]  HPI Pt presents today for an acute problem visit (>30 mins late to appt). Pt presents with chief complaint of URI. Symptoms include rhinorrhea, nasal congestion, sinus pressure, mild cough. Sxs have been present for the last week. No fevers. Pt has not used anything to help with sxs. Pt unsure of sick contacts. Pt also smoking up to 1PPD.    Review of Systems See HPI, otherwise ROS negative     Objective:   Physical Exam Gen: up in chair, NAD, obese  HEENT: NCAT, EOMI, TMs clear bilaterally, +nasal erythema, rhinorrhea bilaterally, + post oropharyngeal erythema  CV: RRR, no murmurs auscultated PULM: CTAB, no wheezes, rales, rhoncii ABD: S/NT/+ bowel sounds  EXT: 2+ peripheral pulses   Assessment & Plan:

## 2011-06-04 ENCOUNTER — Encounter (HOSPITAL_BASED_OUTPATIENT_CLINIC_OR_DEPARTMENT_OTHER): Payer: Medicare Other

## 2011-06-07 ENCOUNTER — Encounter (HOSPITAL_COMMUNITY): Payer: Self-pay | Admitting: Emergency Medicine

## 2011-06-07 ENCOUNTER — Emergency Department (HOSPITAL_COMMUNITY)
Admission: EM | Admit: 2011-06-07 | Discharge: 2011-06-07 | Disposition: A | Discharge status: Home / Self Care | Payer: Medicare Other | Attending: Emergency Medicine | Admitting: Emergency Medicine

## 2011-06-07 DIAGNOSIS — I10 Essential (primary) hypertension: Secondary | ICD-10-CM | POA: Insufficient documentation

## 2011-06-07 DIAGNOSIS — R51 Headache: Secondary | ICD-10-CM | POA: Insufficient documentation

## 2011-06-07 DIAGNOSIS — R110 Nausea: Secondary | ICD-10-CM | POA: Insufficient documentation

## 2011-06-07 DIAGNOSIS — Z862 Personal history of diseases of the blood and blood-forming organs and certain disorders involving the immune mechanism: Secondary | ICD-10-CM | POA: Insufficient documentation

## 2011-06-07 DIAGNOSIS — IMO0001 Reserved for inherently not codable concepts without codable children: Secondary | ICD-10-CM | POA: Insufficient documentation

## 2011-06-07 DIAGNOSIS — R070 Pain in throat: Secondary | ICD-10-CM | POA: Insufficient documentation

## 2011-06-07 DIAGNOSIS — E785 Hyperlipidemia, unspecified: Secondary | ICD-10-CM | POA: Insufficient documentation

## 2011-06-07 DIAGNOSIS — J329 Chronic sinusitis, unspecified: Secondary | ICD-10-CM | POA: Insufficient documentation

## 2011-06-07 DIAGNOSIS — H548 Legal blindness, as defined in USA: Secondary | ICD-10-CM | POA: Insufficient documentation

## 2011-06-07 DIAGNOSIS — G4733 Obstructive sleep apnea (adult) (pediatric): Secondary | ICD-10-CM | POA: Insufficient documentation

## 2011-06-07 DIAGNOSIS — Z79899 Other long term (current) drug therapy: Secondary | ICD-10-CM | POA: Insufficient documentation

## 2011-06-07 DIAGNOSIS — R6883 Chills (without fever): Secondary | ICD-10-CM | POA: Insufficient documentation

## 2011-06-07 DIAGNOSIS — R05 Cough: Secondary | ICD-10-CM | POA: Insufficient documentation

## 2011-06-07 DIAGNOSIS — Z8639 Personal history of other endocrine, nutritional and metabolic disease: Secondary | ICD-10-CM | POA: Insufficient documentation

## 2011-06-07 DIAGNOSIS — R059 Cough, unspecified: Secondary | ICD-10-CM | POA: Insufficient documentation

## 2011-06-07 DIAGNOSIS — R599 Enlarged lymph nodes, unspecified: Secondary | ICD-10-CM | POA: Insufficient documentation

## 2011-06-07 DIAGNOSIS — J3489 Other specified disorders of nose and nasal sinuses: Secondary | ICD-10-CM | POA: Insufficient documentation

## 2011-06-07 MED ORDER — AZITHROMYCIN 250 MG PO TABS: ORAL_TABLET | ORAL | Status: AC

## 2011-06-07 MED ORDER — CETIRIZINE HCL 10 MG PO CAPS: 10.0000 mg | ORAL_CAPSULE | Freq: Once | ORAL | Status: DC

## 2011-06-07 NOTE — ED Provider Notes (Signed)
History     CSN: [id number]  Arrival date & time 06/07/11  4401   First MD Initiated Contact with Patient 06/07/11 1029      Chief Complaint  Patient presents with  . Influenza    (Consider location/radiation/quality/duration/timing/severity/associated sxs/prior treatment) Patient is a 41 y.o. male presenting with flu symptoms. The history is provided by the patient.  Influenza This is a new problem. The current episode started 1 to 4 weeks ago. The problem occurs constantly. The problem has been gradually worsening. Associated symptoms include chills, congestion, coughing, headaches, myalgias, nausea, a sore throat and swollen glands. Pertinent negatives include no fever or neck pain.  Pt with URI symptoms for over a week. Went to PCP, told it was a "cold." Did not try any medications. States now pain worsening over sinuses, and ears. Did not measure temp at home. Denies neck pain or stiffness.  Past Medical History  Diagnosis Date  . Hyperlipidemia   . Hypertension   . Obesity   . OSA (obstructive sleep apnea)   . Gout   . Legal blindness   . Knee pain, bilateral   . Tobacco abuse   . Carpal tunnel syndrome   . Condylomata acuminata in male     Past Surgical History  Procedure Date  . 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     Family History  Problem Relation Age of Onset  . Diabetes Mother     History  Substance Use Topics  . Smoking status: Current Everyday Smoker -- 0.8 packs/day    Types: Cigarettes  . Smokeless tobacco: Not on file  . Alcohol Use: No      Review of Systems  Constitutional: Positive for chills. Negative for fever.  HENT: Positive for ear pain, congestion and sore throat. Negative for neck pain and neck stiffness.   Eyes: Negative.   Respiratory: Positive for cough. Negative for chest tightness and shortness of breath.   Gastrointestinal: Positive for nausea.    Genitourinary: Negative.   Musculoskeletal: Positive for myalgias.  Skin: Negative.   Neurological: Positive for headaches.  Psychiatric/Behavioral: Negative.     Allergies  Other  Home Medications   Current Outpatient Rx  Name Route Sig Dispense Refill  . ALLOPURINOL 100 MG PO TABS Oral Take 2 tablets (200 mg total) by mouth 2 (two) times daily. 120 tablet 1  . AMLODIPINE BESYLATE 10 MG PO TABS Oral Take 1 tablet (10 mg total) by mouth daily. 30 tablet 1  . ASPIRIN EC 81 MG PO TBEC Oral Take 1 tablet (81 mg total) by mouth daily. 30 tablet 11  . COLCHICINE 0.6 MG PO TABS Oral Take 0.6 mg by mouth every 8 (eight) hours as needed. For gout flare    . ESOMEPRAZOLE MAGNESIUM 40 MG PO CPDR Oral Take 1 capsule (40 mg total) by mouth daily before breakfast. 30 capsule 1  . LOSARTAN POTASSIUM 50 MG PO TABS Oral Take 1 tablet (50 mg total) by mouth daily. 90 tablet 3  . METOPROLOL TARTRATE 100 MG PO TABS Oral Take 1 tablet (100 mg total) by mouth 2 (two) times daily. 60 tablet 1  . POTASSIUM CHLORIDE CRYS ER 20 MEQ PO TBCR Oral Take 40 mEq by mouth daily.    Marland Kitchen SPIRONOLACTONE 25 MG PO TABS Oral Take 1 tablet (25 mg total) by mouth daily. 30 tablet 1  . TORSEMIDE 20 MG PO TABS  Oral Take 2 tablets (40 mg total) by mouth daily. 60 tablet 1    This is substitute for lasix    BP 117/78  Pulse 96  Temp(Src) 97.6 F (36.4 C) (Oral)  Resp 18  SpO2 96%  Physical Exam  Nursing note and vitals reviewed. Constitutional: He is oriented to person, place, and time. He appears well-developed and well-nourished. No distress.  HENT:  Head: Normocephalic and atraumatic.  Right Ear: Tympanic membrane, external ear and ear canal normal.  Left Ear: Tympanic membrane and ear canal normal.  Nose: Rhinorrhea present. Right sinus exhibits maxillary sinus tenderness and frontal sinus tenderness. Left sinus exhibits maxillary sinus tenderness and frontal sinus tenderness.  Mouth/Throat: Uvula is midline,  oropharynx is clear and moist and mucous membranes are normal.  Neck: Normal range of motion. Neck supple.       No meningismus  Cardiovascular: Normal rate, regular rhythm and normal heart sounds.   Pulmonary/Chest: Effort normal and breath sounds normal. He has no wheezes. He has no rales.  Abdominal: Soft. Bowel sounds are normal. There is no tenderness.  Musculoskeletal: Normal range of motion. He exhibits no edema.  Lymphadenopathy:    He has no cervical adenopathy.  Neurological: He is oriented to person, place, and time.  Skin: Skin is warm and dry. No rash noted.  Psychiatric: He has a normal mood and affect.    ED Course  Procedures (including critical care time)  Pt with URI symptoms. VS normal. Not taking any medications. Sinuses tender. Pt requesting antibiotics. Explained they most likely will not help, most likely a virus. Pt afebrile, lungs clear. Will d/c home with follow up.  No diagnosis found.    MDM          Lottie Mussel, PA 06/07/11 1116

## 2011-06-07 NOTE — ED Notes (Signed)
C/o uri sx for at least one week. Denies sob/cp.

## 2011-06-07 NOTE — ED Notes (Signed)
Family at bedside.pt. Is blind

## 2011-06-07 NOTE — ED Notes (Signed)
Flu like s/s x 1 week another family member had it before ears hurting

## 2011-06-08 NOTE — ED Provider Notes (Signed)
Medical screening examination/treatment/procedure(s) were performed by non-physician practitioner and as supervising physician I was immediately available for consultation/collaboration.   [name], MD 06/08/11 [phone]

## 2011-06-13 ENCOUNTER — Encounter (HOSPITAL_BASED_OUTPATIENT_CLINIC_OR_DEPARTMENT_OTHER): Payer: Medicare Other

## 2011-06-22 ENCOUNTER — Encounter: Payer: Self-pay | Admitting: Family Medicine

## 2011-06-22 ENCOUNTER — Ambulatory Visit (INDEPENDENT_AMBULATORY_CARE_PROVIDER_SITE_OTHER): Payer: Medicare Other | Admitting: Family Medicine

## 2011-06-22 VITALS — BP 135/98 | HR 80 | Ht 71.0 in | Wt 317.0 lb

## 2011-06-22 DIAGNOSIS — M25561 Pain in right knee: Secondary | ICD-10-CM

## 2011-06-22 DIAGNOSIS — M25569 Pain in unspecified knee: Secondary | ICD-10-CM

## 2011-06-22 MED ORDER — TRAMADOL HCL 50 MG PO TABS
50.0000 mg | ORAL_TABLET | Freq: Three times a day (TID) | ORAL | Status: AC | PRN
Start: 2011-06-22 — End: 2011-07-02

## 2011-06-22 NOTE — Patient Instructions (Signed)
Knee Pain The knee is the complex joint between your thigh and your lower leg. It is made up of bones, tendons, ligaments, and cartilage. The bones that make up the knee are:  The femur in the thigh.   The tibia and fibula in the lower leg.   The patella or kneecap riding in the groove on the lower femur.  CAUSES  Knee pain is a common complaint with many causes. A few of these causes are:  Injury, such as:   A ruptured ligament or tendon injury.   Torn cartilage.   Medical conditions, such as:   Gout   Arthritis   Infections   Overuse, over training or overdoing a physical activity.  Knee pain can be minor or severe. Knee pain can accompany debilitating injury. Minor knee problems often respond well to self-care measures or get well on their own. More serious injuries may need medical intervention or even surgery. SYMPTOMS The knee is complex. Symptoms of knee problems can vary widely. Some of the problems are:  Pain with movement and weight bearing.   Swelling and tenderness.   Buckling of the knee.   Inability to straighten or extend your knee.   Your knee locks and you cannot straighten it.   Warmth and redness with pain and fever.   Deformity or dislocation of the kneecap.  DIAGNOSIS  Determining what is wrong may be very straight forward such as when there is an injury. It can also be challenging because of the complexity of the knee. Tests to make a diagnosis may include:  Your caregiver taking a history and doing a physical exam.   Routine X-rays can be used to rule out other problems. X-rays will not reveal a cartilage tear. Some injuries of the knee can be diagnosed by:   Arthroscopy a surgical technique by which a small video camera is inserted through tiny incisions on the sides of the knee. This procedure is used to examine and repair internal knee joint problems. Tiny instruments can be used during arthroscopy to repair the torn knee cartilage  (meniscus).   Arthrography is a radiology technique. A contrast liquid is directly injected into the knee joint. Internal structures of the knee joint then become visible on X-ray film.   An MRI scan is a non x-ray radiology procedure in which magnetic fields and a computer produce two- or three-dimensional images of the inside of the knee. Cartilage tears are often visible using an MRI scanner. MRI scans have largely replaced arthrography in diagnosing cartilage tears of the knee.   Blood work.   Examination of the fluid that helps to lubricate the knee joint (synovial fluid). This is done by taking a sample out using a needle and a syringe.  TREATMENT The treatment of knee problems depends on the cause. Some of these treatments are:  Depending on the injury, proper casting, splinting, surgery or physical therapy care will be needed.   Give yourself adequate recovery time. Do not overuse your joints. If you begin to get sore during workout routines, back off. Slow down or do fewer repetitions.   For repetitive activities such as cycling or running, maintain your strength and nutrition.   Alternate muscle groups. For example if you are a weight lifter, work the upper body on one day and the lower body the next.   Either tight or weak muscles do not give the proper support for your knee. Tight or weak muscles do not absorb the stress placed   on the knee joint. Keep the muscles surrounding the knee strong.   Take care of mechanical problems.   If you have flat feet, orthotics or special shoes may help. See your caregiver if you need help.   Arch supports, sometimes with wedges on the inner or outer aspect of the heel, can help. These can shift pressure away from the side of the knee most bothered by osteoarthritis.   A brace called an "unloader" brace also may be used to help ease the pressure on the most arthritic side of the knee.   If your caregiver has prescribed crutches, braces,  wraps or ice, use as directed. The acronym for this is PRICE. This means protection, rest, ice, compression and elevation.   Nonsteroidal anti-inflammatory drugs (NSAID's), can help relieve pain. But if taken immediately after an injury, they may actually increase swelling. Take NSAID's with food in your stomach. Stop them if you develop stomach problems. Do not take these if you have a history of ulcers, stomach pain or bleeding from the bowel. Do not take without your caregiver's approval if you have problems with fluid retention, heart failure, or kidney problems.   For ongoing knee problems, physical therapy may be helpful.   Glucosamine and chondroitin are over-the-counter dietary supplements. Both may help relieve the pain of osteoarthritis in the knee. These medicines are different from the usual anti-inflammatory drugs. Glucosamine may decrease the rate of cartilage destruction.   Injections of a corticosteroid drug into your knee joint may help reduce the symptoms of an arthritis flare-up. They may provide pain relief that lasts a few months. You may have to wait a few months between injections. The injections do have a small increased risk of infection, water retention and elevated blood sugar levels.   Hyaluronic acid injected into damaged joints may ease pain and provide lubrication. These injections may work by reducing inflammation. A series of shots may give relief for as long as 6 months.   Topical painkillers. Applying certain ointments to your skin may help relieve the pain and stiffness of osteoarthritis. Ask your pharmacist for suggestions. Many over the-counter products are approved for temporary relief of arthritis pain.   In some countries, doctors often prescribe topical NSAID's for relief of chronic conditions such as arthritis and tendinitis. A review of treatment with NSAID creams found that they worked as well as oral medications but without the serious side effects.    PREVENTION  Maintain a healthy weight. Extra pounds put more strain on your joints.   Get strong, stay limber. Weak muscles are a common cause of knee injuries. Stretching is important. Include flexibility exercises in your workouts.   Be smart about exercise. If you have osteoarthritis, chronic knee pain or recurring injuries, you may need to change the way you exercise. This does not mean you have to stop being active. If your knees ache after jogging or playing basketball, consider switching to swimming, water aerobics or other low-impact activities, at least for a few days a week. Sometimes limiting high-impact activities will provide relief.   Make sure your shoes fit well. Choose footwear that is right for your sport.   Protect your knees. Use the proper gear for knee-sensitive activities. Use kneepads when playing volleyball or laying carpet. Buckle your seat belt every time you drive. Most shattered kneecaps occur in car accidents.   Rest when you are tired.  SEEK MEDICAL CARE IF:  You have knee pain that is continual and does not   seem to be getting better.  SEEK IMMEDIATE MEDICAL CARE IF:  Your knee joint feels hot to the touch and you have a high fever. MAKE SURE YOU:   Understand these instructions.   Will watch your condition.   Will get help right away if you are not doing well or get worse.  Document Released: 02/07/2007 Document Revised: 12/23/2010 Document Reviewed: 02/07/2007 [employer] Patient Information 2012 [employer]. 

## 2011-06-25 DIAGNOSIS — M25561 Pain in right knee: Secondary | ICD-10-CM | POA: Insufficient documentation
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
## 2011-06-25 NOTE — Assessment & Plan Note (Addendum)
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 Will obtain knee xrays with weight bearing films to assess degree of knee breakdown. Will place in physical therapy for treatment. Tramadol for pain. Also addressed weight loss. Will follow up in 2-4 weeks. Pt may need referral to ortho depending on degree of joint replacement breakdown.

## 2011-07-09 ENCOUNTER — Ambulatory Visit (HOSPITAL_BASED_OUTPATIENT_CLINIC_OR_DEPARTMENT_OTHER): Payer: Medicare Other

## 2011-07-19 ENCOUNTER — Ambulatory Visit: Payer: Medicare Other | Admitting: Family Medicine

## 2011-07-20 ENCOUNTER — Other Ambulatory Visit: Payer: Self-pay | Admitting: Family Medicine

## 2011-07-21 ENCOUNTER — Other Ambulatory Visit: Payer: Self-pay | Admitting: Family Medicine

## 2011-08-17 ENCOUNTER — Ambulatory Visit (INDEPENDENT_AMBULATORY_CARE_PROVIDER_SITE_OTHER): Payer: Medicare Other | Admitting: Family Medicine

## 2011-08-17 ENCOUNTER — Encounter: Payer: Self-pay | Admitting: Family Medicine

## 2011-08-17 VITALS — BP 139/98 | HR 62 | Ht 71.0 in | Wt 313.9 lb

## 2011-08-17 DIAGNOSIS — Z760 Encounter for issue of repeat prescription: Secondary | ICD-10-CM

## 2011-08-17 DIAGNOSIS — I10 Essential (primary) hypertension: Secondary | ICD-10-CM

## 2011-08-17 DIAGNOSIS — J329 Chronic sinusitis, unspecified: Secondary | ICD-10-CM | POA: Insufficient documentation

## 2011-08-17 DIAGNOSIS — F17200 Nicotine dependence, unspecified, uncomplicated: Secondary | ICD-10-CM | POA: Insufficient documentation

## 2011-08-17 MED ORDER — ALBUTEROL SULFATE HFA 108 (90 BASE) MCG/ACT IN AERS: 2.0000 | INHALATION_SPRAY | Freq: Four times a day (QID) | RESPIRATORY_TRACT | Status: DC | PRN

## 2011-08-17 MED ORDER — CETIRIZINE HCL 10 MG PO CAPS: 10.0000 mg | ORAL_CAPSULE | Freq: Once | ORAL | Status: DC

## 2011-08-17 NOTE — Assessment & Plan Note (Signed)
After assessing the patients complaints and completing a physical examination, it's recommended to treat the patient symptomatically.  He will be given an albuterol inhaler to use as needed and Zyrtec for allergies.  If the wheezing does not improve, then he's to follow up for further pulmonology studies.

## 2011-08-17 NOTE — Patient Instructions (Signed)
Allergic Rhinitis  Allergic rhinitis is when the mucous membranes in the nose respond to allergens. Allergens are particles in the air that cause your body to have an allergic reaction. This causes you to release allergic antibodies. Through a chain of events, these eventually cause you to release histamine into the blood stream (hence the use of antihistamines). Although meant to be protective to the body, it is this release that causes your discomfort, such as frequent sneezing, congestion and an itchy runny nose.    CAUSES    The pollen allergens may come from grasses, trees, and weeds. This is seasonal allergic rhinitis, or "hay fever." Other allergens cause year-round allergic rhinitis (perennial allergic rhinitis) such as house dust mite allergen, pet dander and mold spores.    SYMPTOMS     Nasal stuffiness (congestion).   Runny, itchy nose with sneezing and tearing of the eyes.   There is often an itching of the mouth, eyes and ears.  It cannot be cured, but it can be controlled with medications.  DIAGNOSIS    If you are unable to determine the offending allergen, skin or blood testing may find it.  TREATMENT     Avoid the allergen.   Medications and allergy shots (immunotherapy) can help.   Hay fever may often be treated with antihistamines in pill or nasal spray forms. Antihistamines block the effects of histamine. There are over-the-counter medicines that may help with nasal congestion and swelling around the eyes. Check with your caregiver before taking or giving this medicine.  If the treatment above does not work, there are many new medications your caregiver can prescribe. Stronger medications may be used if initial measures are ineffective. Desensitizing injections can be used if medications and avoidance fails. Desensitization is when a patient is given ongoing shots until the body becomes less sensitive to the allergen. Make sure you follow up with your caregiver if problems continue.  SEEK  MEDICAL CARE IF:     You develop fever (more than 100.5 F (38.1 C).   You develop a cough that does not stop easily (persistent).   You have shortness of breath.   You start wheezing.   Symptoms interfere with normal daily activities.  Document Released: 01/05/2001 Document Revised: 04/01/2011 Document Reviewed: 07/17/2008  [employer] Patient Information 2012 [employer].

## 2011-08-17 NOTE — Assessment & Plan Note (Signed)
The patient has been smoking for 15 years.  He has tried to cut down in the past few months from 1.5 packs to less than one pack a day.  We have discussed about trying to cut down even more with the ultimate goal of quitting smoking entirely.  The patient is on board with that plan.

## 2011-08-17 NOTE — Progress Notes (Signed)
Subjective:     Patient ID: Mike Davenport, male   DOB: Mar 15, 1971, 41 y.o.   MRN: [id number]  HPI The patient is a 41 yo M with a PMH of HTN presents today with a medication refill request.  In addition, the patient states that he's been experiencing a 2 week history of productive cough with yellowish sputum, wheezing, runny nose, itchy eyes, and a one day fever.  These symptoms have been getting better on it's on, with no medications/treatment.  Patient denies: SOB, chest pain, change in appetite, change in bowels.  Review of Systems  Constitutional: Positive for fever.  HENT: Positive for ear pain, rhinorrhea and postnasal drip.   Eyes: Positive for itching.  Respiratory: Positive for cough and wheezing.   Cardiovascular: Positive for leg swelling.  Genitourinary: Negative.   Musculoskeletal: Negative.   Neurological: Negative.   Hematological: Negative.   Psychiatric/Behavioral: Negative.        Objective:   Physical Exam  Constitutional: He is oriented to person, place, and time. He appears well-developed and well-nourished.  HENT:  Head: Normocephalic.  Right Ear: External ear normal.  Left Ear: External ear normal.  Nose: Nose normal.  Mouth/Throat: Oropharynx is clear and moist.  Eyes: EOM are normal.       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 No tracheal deviation present. No thyromegaly present.  Cardiovascular: Normal rate, regular rhythm, normal heart sounds and intact distal pulses.  Exam reveals no gallop and no friction rub.   No murmur heard. Pulmonary/Chest: Effort normal. No respiratory distress. He has wheezes. He has no rales.       Wheezing heard in upper right lungs  Abdominal: Soft. Bowel sounds are normal. There is no tenderness.  Musculoskeletal: Normal range of motion.  Lymphadenopathy:    He has no cervical adenopathy.  Neurological: He is alert and oriented to person, place, and time.  Skin: Skin is warm.  Psychiatric: He  has a normal mood and affect. His behavior is normal. Judgment and thought content normal.       Assessment:         Plan:     Pt seen and examined with MS3 and agree with assessment and plan.

## 2011-08-17 NOTE — Assessment & Plan Note (Signed)
After reviewing the medication list with the patient, he will be receiving a refill on the appropriate medications.

## 2011-08-18 LAB — COMPREHENSIVE METABOLIC PANEL
ALT: 22 U/L (ref 0–53)
CO2: 28 mEq/L (ref 19–32)
Calcium: 8.8 mg/dL (ref 8.4–10.5)
Chloride: 103 mEq/L (ref 96–112)
Creat: 0.91 mg/dL (ref 0.50–1.35)
Glucose, Bld: 74 mg/dL (ref 70–99)
Total Protein: 6.3 g/dL (ref 6.0–8.3)

## 2011-08-19 ENCOUNTER — Other Ambulatory Visit: Payer: Self-pay | Admitting: Family Medicine

## 2011-08-19 ENCOUNTER — Encounter: Payer: Self-pay | Admitting: Family Medicine

## 2011-10-07 ENCOUNTER — Other Ambulatory Visit: Payer: Self-pay | Admitting: Family Medicine

## 2011-11-15 ENCOUNTER — Other Ambulatory Visit: Payer: Self-pay | Admitting: Family Medicine

## 2011-11-29 ENCOUNTER — Other Ambulatory Visit: Payer: Self-pay | Admitting: *Deleted

## 2011-11-29 MED ORDER — TORSEMIDE 20 MG PO TABS: 40.0000 mg | ORAL_TABLET | Freq: Every day | ORAL | Status: DC

## 2011-11-29 MED ORDER — METOPROLOL TARTRATE 100 MG PO TABS: 100.0000 mg | ORAL_TABLET | Freq: Two times a day (BID) | ORAL | Status: DC

## 2011-11-29 MED ORDER — ESOMEPRAZOLE MAGNESIUM 40 MG PO CPDR: 40.0000 mg | DELAYED_RELEASE_CAPSULE | Freq: Every day | ORAL | Status: DC

## 2011-11-29 MED ORDER — LISINOPRIL 20 MG PO TABS: 20.0000 mg | ORAL_TABLET | Freq: Every day | ORAL | Status: DC

## 2012-02-14 ENCOUNTER — Other Ambulatory Visit: Payer: Self-pay | Admitting: *Deleted

## 2012-02-14 MED ORDER — ESOMEPRAZOLE MAGNESIUM 40 MG PO CPDR: 40.0000 mg | DELAYED_RELEASE_CAPSULE | Freq: Every day | ORAL | Status: DC

## 2012-04-10 ENCOUNTER — Other Ambulatory Visit: Payer: Self-pay | Admitting: *Deleted

## 2012-04-10 MED ORDER — ESOMEPRAZOLE MAGNESIUM 40 MG PO CPDR: 40.0000 mg | DELAYED_RELEASE_CAPSULE | Freq: Every day | ORAL | Status: DC

## 2012-04-24 ENCOUNTER — Other Ambulatory Visit: Payer: Self-pay | Admitting: Emergency Medicine

## 2012-04-24 NOTE — Telephone Encounter (Signed)
Forward to PCP for refill

## 2012-04-24 NOTE — Telephone Encounter (Signed)
Patient needs refills on Nexium and Metoprolol sent to [facility] on [address].  He is no longer using the [facility] in [place] due to now living in [place].

## 2012-04-28 MED ORDER — ESOMEPRAZOLE MAGNESIUM 40 MG PO CPDR: 40.0000 mg | DELAYED_RELEASE_CAPSULE | Freq: Every day | ORAL | Status: DC

## 2012-04-28 MED ORDER — METOPROLOL TARTRATE 100 MG PO TABS: 100.0000 mg | ORAL_TABLET | Freq: Two times a day (BID) | ORAL | Status: DC

## 2012-04-28 NOTE — Telephone Encounter (Signed)
Refills sent to [facility] on [address].

## 2012-05-22 ENCOUNTER — Encounter (HOSPITAL_COMMUNITY): Payer: Self-pay | Admitting: *Deleted

## 2012-05-22 ENCOUNTER — Emergency Department (HOSPITAL_COMMUNITY)
Admission: EM | Admit: 2012-05-22 | Discharge: 2012-05-22 | Disposition: A | Discharge status: Home / Self Care | Payer: Medicare Other | Attending: Emergency Medicine | Admitting: Emergency Medicine

## 2012-05-22 DIAGNOSIS — K0889 Other specified disorders of teeth and supporting structures: Secondary | ICD-10-CM

## 2012-05-22 DIAGNOSIS — Z79899 Other long term (current) drug therapy: Secondary | ICD-10-CM | POA: Insufficient documentation

## 2012-05-22 DIAGNOSIS — E669 Obesity, unspecified: Secondary | ICD-10-CM | POA: Insufficient documentation

## 2012-05-22 DIAGNOSIS — F17200 Nicotine dependence, unspecified, uncomplicated: Secondary | ICD-10-CM | POA: Insufficient documentation

## 2012-05-22 DIAGNOSIS — Z8739 Personal history of other diseases of the musculoskeletal system and connective tissue: Secondary | ICD-10-CM | POA: Insufficient documentation

## 2012-05-22 DIAGNOSIS — R51 Headache: Secondary | ICD-10-CM | POA: Insufficient documentation

## 2012-05-22 DIAGNOSIS — H548 Legal blindness, as defined in USA: Secondary | ICD-10-CM | POA: Insufficient documentation

## 2012-05-22 DIAGNOSIS — Z8639 Personal history of other endocrine, nutritional and metabolic disease: Secondary | ICD-10-CM | POA: Insufficient documentation

## 2012-05-22 DIAGNOSIS — Z862 Personal history of diseases of the blood and blood-forming organs and certain disorders involving the immune mechanism: Secondary | ICD-10-CM | POA: Insufficient documentation

## 2012-05-22 DIAGNOSIS — K089 Disorder of teeth and supporting structures, unspecified: Secondary | ICD-10-CM | POA: Insufficient documentation

## 2012-05-22 DIAGNOSIS — Z8619 Personal history of other infectious and parasitic diseases: Secondary | ICD-10-CM | POA: Insufficient documentation

## 2012-05-22 DIAGNOSIS — I10 Essential (primary) hypertension: Secondary | ICD-10-CM | POA: Insufficient documentation

## 2012-05-22 DIAGNOSIS — Z8669 Personal history of other diseases of the nervous system and sense organs: Secondary | ICD-10-CM | POA: Insufficient documentation

## 2012-05-22 MED ORDER — OXYCODONE-ACETAMINOPHEN 5-325 MG PO TABS
1.0000 | ORAL_TABLET | Freq: Once | ORAL | Status: AC
  Administered 2012-05-22: 1 via ORAL
  Filled 2012-05-22: qty 1

## 2012-05-22 MED ORDER — AMOXICILLIN 500 MG PO CAPS
1000.0000 mg | ORAL_CAPSULE | Freq: Once | ORAL | Status: AC
  Administered 2012-05-22: 1000 mg via ORAL
  Filled 2012-05-22: qty 2

## 2012-05-22 MED ORDER — AMOXICILLIN 500 MG PO CAPS: 1000.0000 mg | ORAL_CAPSULE | Freq: Two times a day (BID) | ORAL | Status: DC

## 2012-05-22 MED ORDER — OXYCODONE-ACETAMINOPHEN 5-325 MG PO TABS: 1.0000 | ORAL_TABLET | ORAL | Status: DC | PRN

## 2012-05-22 NOTE — ED Notes (Signed)
Pt complaining of a toothache on the left side of his mouth both upper and lower, denies having anything similar in the past.  Swelling noted to left side of mouth, airway open and and intact.

## 2012-05-22 NOTE — ED Provider Notes (Signed)
History  This chart was scribed for Mike Booze, MD by Marlin Canary ED Scribe. The patient was seen in room TR05C/TR05C. Patient's care was started at 2303.  CSN: [id number]  Arrival date & time 05/22/12  2234   First MD Initiated Contact with Patient 05/22/12 2303      Chief Complaint  Patient presents with  . Dental Pain     The history is provided by the patient. No language interpreter was used.   Mike Davenport is a 42 y.o. male who presents to the Emergency Department complaining of constant, gradually worsening, dental pain on the bottom and top of the left side of the mouth with an onset of 2 days ago. Pt reports associated headache. He rates his pain 10/10. He states that exposure to cold air, warm and cold liquids and eating make the symptoms worse. He took ibuprofen with mild symptom relief. He admits to smoking 1/2 pack daily.   Past Medical History  Diagnosis Date  . Hyperlipidemia   . Hypertension   . Obesity   . OSA (obstructive sleep apnea)   . Gout   . Legal blindness   . Knee pain, bilateral   . Tobacco abuse   . Carpal tunnel syndrome   . Condylomata acuminata in male     Past Surgical History  Procedure Date  . 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     Family History  Problem Relation Age of Onset  . Diabetes Mother     History  Substance Use Topics  . Smoking status: Current Every Day Smoker -- 0.5 packs/day    Types: Cigarettes  . Smokeless tobacco: Not on file     Comment: decreased smoking  . Alcohol Use: No      Review of Systems  HENT: Positive for dental problem. Negative for mouth sores.   Neurological: Positive for headaches. Negative for syncope.  All other systems reviewed and are negative.    Allergies  Other  Home Medications   Current Outpatient Rx  Name  Route  Sig  Dispense  Refill  . ESOMEPRAZOLE MAGNESIUM 40 MG PO CPDR   Oral  Take 1 capsule (40 mg total) by mouth daily before breakfast.   30 capsule   3   . FUROSEMIDE 40 MG PO TABS   Oral   Take 40 mg by mouth daily.         Marland Kitchen METOPROLOL TARTRATE 100 MG PO TABS   Oral   Take 1 tablet (100 mg total) by mouth 2 (two) times daily.   60 tablet   6     BP 171/122  Pulse 81  Temp 97.9 F (36.6 C)  Resp 18  SpO2 97%  Physical Exam  Nursing note and vitals reviewed. Constitutional: He is oriented to person, place, and time. He appears well-developed and well-nourished. No distress.  HENT:  Head: Normocephalic and atraumatic.       Tenderness to percussion to all teeth on the left side of the mouth. Tooth #17 is carious  and seems to be the most tender of the teeth  Eyes: Conjunctivae normal and EOM are normal.  Neck: Neck supple. No tracheal deviation present.  Cardiovascular: Normal rate.   Pulmonary/Chest: Effort normal. No respiratory distress.  Musculoskeletal: Normal range of motion.  Neurological: He is alert and oriented to person, place, and time.  Skin: Skin is warm and  dry.  Psychiatric: He has a normal mood and affect. His behavior is normal.    ED Course  Procedures (including critical care time) DIAGNOSTIC STUDIES: Oxygen Saturation is 97% on room air, Adequate by my interpretation.    COORDINATION OF CARE: 2309-Patient informed of current plan for treatment and evaluation and agrees with plan at this time. Will have pt f/u with dentist on call and d/c home with antibiotics and pain medication.    1. Pain, dental       MDM  Dental pain which seems to be localized in his wisdom teeth. He is referred to the on-call dentist but advised that he may need to see an oral surgeon. He is sent home with prescription for amoxicillin and Percocet.      I personally performed the services described in this documentation, which was scribed in my presence. The recorded information has been reviewed and is accurate.      Mike Booze, MD 05/22/12 [phone]

## 2012-05-22 NOTE — ED Notes (Signed)
Pt reports (L) upper and lower dental pain.  No bruising or swelling noted.

## 2012-07-09 ENCOUNTER — Encounter (HOSPITAL_COMMUNITY): Payer: Self-pay

## 2012-07-09 ENCOUNTER — Emergency Department (HOSPITAL_COMMUNITY)
Admission: EM | Admit: 2012-07-09 | Discharge: 2012-07-09 | Disposition: A | Discharge status: Home / Self Care | Payer: Medicare Other | Attending: Emergency Medicine | Admitting: Emergency Medicine

## 2012-07-09 DIAGNOSIS — F17200 Nicotine dependence, unspecified, uncomplicated: Secondary | ICD-10-CM | POA: Insufficient documentation

## 2012-07-09 DIAGNOSIS — K0889 Other specified disorders of teeth and supporting structures: Secondary | ICD-10-CM

## 2012-07-09 DIAGNOSIS — R220 Localized swelling, mass and lump, head: Secondary | ICD-10-CM | POA: Insufficient documentation

## 2012-07-09 DIAGNOSIS — Z8739 Personal history of other diseases of the musculoskeletal system and connective tissue: Secondary | ICD-10-CM | POA: Insufficient documentation

## 2012-07-09 DIAGNOSIS — Z8639 Personal history of other endocrine, nutritional and metabolic disease: Secondary | ICD-10-CM | POA: Insufficient documentation

## 2012-07-09 DIAGNOSIS — I10 Essential (primary) hypertension: Secondary | ICD-10-CM | POA: Insufficient documentation

## 2012-07-09 DIAGNOSIS — Z862 Personal history of diseases of the blood and blood-forming organs and certain disorders involving the immune mechanism: Secondary | ICD-10-CM | POA: Insufficient documentation

## 2012-07-09 DIAGNOSIS — R002 Palpitations: Secondary | ICD-10-CM

## 2012-07-09 DIAGNOSIS — M109 Gout, unspecified: Secondary | ICD-10-CM | POA: Insufficient documentation

## 2012-07-09 DIAGNOSIS — Z8669 Personal history of other diseases of the nervous system and sense organs: Secondary | ICD-10-CM | POA: Insufficient documentation

## 2012-07-09 DIAGNOSIS — Z79899 Other long term (current) drug therapy: Secondary | ICD-10-CM | POA: Insufficient documentation

## 2012-07-09 DIAGNOSIS — H548 Legal blindness, as defined in USA: Secondary | ICD-10-CM | POA: Insufficient documentation

## 2012-07-09 DIAGNOSIS — Z8619 Personal history of other infectious and parasitic diseases: Secondary | ICD-10-CM | POA: Insufficient documentation

## 2012-07-09 DIAGNOSIS — K089 Disorder of teeth and supporting structures, unspecified: Secondary | ICD-10-CM | POA: Insufficient documentation

## 2012-07-09 DIAGNOSIS — R079 Chest pain, unspecified: Secondary | ICD-10-CM | POA: Insufficient documentation

## 2012-07-09 LAB — POCT I-STAT TROPONIN I: Troponin i, poc: 0 ng/mL (ref 0.00–0.08)

## 2012-07-09 MED ORDER — OXYCODONE-ACETAMINOPHEN 5-325 MG PO TABS: 1.0000 | ORAL_TABLET | ORAL | Status: DC | PRN

## 2012-07-09 MED ORDER — IBUPROFEN 600 MG PO TABS: 600.0000 mg | ORAL_TABLET | Freq: Three times a day (TID) | ORAL | Status: DC | PRN

## 2012-07-09 MED ORDER — IBUPROFEN 400 MG PO TABS
600.0000 mg | ORAL_TABLET | Freq: Once | ORAL | Status: AC
  Administered 2012-07-09: 600 mg via ORAL
  Filled 2012-07-09: qty 1

## 2012-07-09 MED ORDER — PENICILLIN V POTASSIUM 250 MG PO TABS
500.0000 mg | ORAL_TABLET | Freq: Once | ORAL | Status: AC
  Administered 2012-07-09: 500 mg via ORAL
  Filled 2012-07-09: qty 2

## 2012-07-09 MED ORDER — OXYCODONE-ACETAMINOPHEN 5-325 MG PO TABS
2.0000 | ORAL_TABLET | Freq: Once | ORAL | Status: AC
  Administered 2012-07-09: 2 via ORAL
  Filled 2012-07-09: qty 2

## 2012-07-09 MED ORDER — PENICILLIN V POTASSIUM 500 MG PO TABS: 500.0000 mg | ORAL_TABLET | Freq: Four times a day (QID) | ORAL | Status: AC

## 2012-07-09 NOTE — Discharge Instructions (Signed)

## 2012-07-09 NOTE — ED Notes (Signed)
Patient presents with c/o left lower dental pain that radiates into jaw and left ear. Onset about 2 hours PTA.  Also c/o non radiating left sided chest pain that started around the same time his mouth began to hurt.  Denies SOB, dizziness, N/V or abd pain.

## 2012-07-10 ENCOUNTER — Telehealth (HOSPITAL_COMMUNITY): Payer: Self-pay | Admitting: Emergency Medicine

## 2012-07-10 NOTE — ED Provider Notes (Signed)
History     CSN: [id number]  Arrival date & time 07/09/12  2218   First MD Initiated Contact with Patient 07/09/12 2309      Chief Complaint  Patient presents with  . Dental Pain  . Chest Pain    The history is provided by the patient and medical records.   patient reports developing left sided dental pain and face pain over the past several days.  His had mild left facial swelling.  His pain is mild to moderate in severity.  Nothing worsens or improves his symptoms.  He's had dental infections in these teeth before.  The difficulty breathing or swallowing.  At times his pain radiates down towards his chest.  No shortness of breath nausea or vomiting.  No diarrhea.  No fevers or chills.  No cardiac history.  Past Medical History  Diagnosis Date  . Hyperlipidemia   . Hypertension   . Obesity   . OSA (obstructive sleep apnea)   . Gout   . Legal blindness   . Knee pain, bilateral   . Tobacco abuse   . Carpal tunnel syndrome   . Condylomata acuminata in male     Past Surgical History  Procedure Laterality Date  . 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     Family History  Problem Relation Age of Onset  . Diabetes Mother     History  Substance Use Topics  . Smoking status: Current Every Day Smoker -- 0.50 packs/day    Types: Cigarettes  . Smokeless tobacco: Never Used     Comment: decreased smoking  . Alcohol Use: No      Review of Systems  Cardiovascular: Positive for chest pain.   10 Systems reviewed and are negative for acute change except as noted in the HPI.  Allergies  Other  Home Medications   Current Outpatient Rx  Name  Route  Sig  Dispense  Refill  . esomeprazole (NEXIUM) 40 MG capsule   Oral   Take 1 capsule (40 mg total) by mouth daily before breakfast.   30 capsule   3   . furosemide (LASIX) 40 MG tablet   Oral   Take 40 mg by mouth daily.         Marland Kitchen guaiFENesin  (MUCINEX) 600 MG 12 hr tablet   Oral   Take 1,200 mg by mouth 2 (two) times daily as needed for congestion.         . metoprolol (LOPRESSOR) 100 MG tablet   Oral   Take 1 tablet (100 mg total) by mouth 2 (two) times daily.   60 tablet   6   . ibuprofen (ADVIL,MOTRIN) 600 MG tablet   Oral   Take 1 tablet (600 mg total) by mouth every 8 (eight) hours as needed for pain.   15 tablet   0   . oxyCODONE-acetaminophen (PERCOCET/ROXICET) 5-325 MG per tablet   Oral   Take 1 tablet by mouth every 4 (four) hours as needed for pain.   20 tablet   0   . penicillin v potassium (VEETID) 500 MG tablet   Oral   Take 1 tablet (500 mg total) by mouth 4 (four) times daily.   40 tablet   0     BP 139/97  Pulse 82  Temp(Src) 98.3 F (36.8 C) (Oral)  Resp 16  SpO2 99%  Physical Exam  Nursing  note and vitals reviewed. Constitutional: He is oriented to person, place, and time. He appears well-developed and well-nourished.  HENT:  Head: Normocephalic and atraumatic.  Dental decay of left upper left lower second molars.  No gingival swelling or fluctuance.  Tolerating secretions.  Oral airway patent  Eyes: EOM are normal.  Neck: Normal range of motion.  Cardiovascular: Normal rate, regular rhythm, normal heart sounds and intact distal pulses.   Pulmonary/Chest: Effort normal and breath sounds normal. No respiratory distress.  Abdominal: Soft. He exhibits no distension. There is no tenderness.  Musculoskeletal: Normal range of motion.  Neurological: He is alert and oriented to person, place, and time.  Skin: Skin is warm and dry.  Psychiatric: He has a normal mood and affect. Judgment normal.    ED Course  Procedures (including critical care time)   Date: 07/10/2012  Rate: 80  Rhythm: normal sinus rhythm  QRS Axis: normal  Intervals: normal  ST/T Wave abnormalities: normal  Conduction Disutrbances: none  Narrative Interpretation:   Old EKG Reviewed: No significant changes  noted     Labs Reviewed  POCT I-STAT TROPONIN I   No results found.   1. Pain, dental   2. Palpitations       MDM  Dental Pain. Home with antibiotics and pain medicine. Recommend dental follow up. No signs of gingival abscess. Tolerating secretions. Airway patent. No sub lingular swelling  Is noncardiac chest pain.  His history pain radiating from his left jaw down towards his chest when his dental pain becomes severe.  This is not a presentation of ACS.        Lyanne Co, MD 07/10/12 [phone]

## 2012-07-11 ENCOUNTER — Telehealth (HOSPITAL_COMMUNITY): Payer: Self-pay | Admitting: Emergency Medicine

## 2012-07-11 NOTE — ED Notes (Signed)
Call from on call dentist requesting demographics and referral be faxed to their office. 

## 2012-07-31 ENCOUNTER — Encounter: Payer: Self-pay | Admitting: Emergency Medicine

## 2012-07-31 ENCOUNTER — Telehealth: Payer: Self-pay | Admitting: Emergency Medicine

## 2012-07-31 ENCOUNTER — Ambulatory Visit (INDEPENDENT_AMBULATORY_CARE_PROVIDER_SITE_OTHER): Payer: Medicare Other | Admitting: Emergency Medicine

## 2012-07-31 VITALS — BP 167/126 | HR 69 | Ht 71.0 in | Wt 319.9 lb

## 2012-07-31 DIAGNOSIS — M25561 Pain in right knee: Secondary | ICD-10-CM

## 2012-07-31 DIAGNOSIS — I5032 Chronic diastolic (congestive) heart failure: Secondary | ICD-10-CM

## 2012-07-31 DIAGNOSIS — M25569 Pain in unspecified knee: Secondary | ICD-10-CM

## 2012-07-31 DIAGNOSIS — I10 Essential (primary) hypertension: Secondary | ICD-10-CM

## 2012-07-31 DIAGNOSIS — I509 Heart failure, unspecified: Secondary | ICD-10-CM

## 2012-07-31 LAB — IRON: Iron: 111 ug/dL (ref 42–165)

## 2012-07-31 LAB — BASIC METABOLIC PANEL
Calcium: 8.9 mg/dL (ref 8.4–10.5)
Creat: 1.14 mg/dL (ref 0.50–1.35)

## 2012-07-31 LAB — CBC
Platelets: 194 10*3/uL (ref 150–400)
RBC: 5.9 MIL/uL — ABNORMAL HIGH (ref 4.22–5.81)
RDW: 14.4 % (ref 11.5–15.5)
WBC: 8.6 10*3/uL (ref 4.0–10.5)

## 2012-07-31 MED ORDER — METOPROLOL TARTRATE 100 MG PO TABS: 100.0000 mg | ORAL_TABLET | Freq: Two times a day (BID) | ORAL | Status: DC

## 2012-07-31 MED ORDER — LISINOPRIL 10 MG PO TABS: 10.0000 mg | ORAL_TABLET | Freq: Every day | ORAL | Status: DC

## 2012-07-31 MED ORDER — TORSEMIDE 20 MG PO TABS: 20.0000 mg | ORAL_TABLET | Freq: Every day | ORAL | Status: DC | PRN

## 2012-07-31 MED ORDER — MELOXICAM 15 MG PO TABS: 15.0000 mg | ORAL_TABLET | Freq: Every day | ORAL | Status: DC

## 2012-07-31 MED ORDER — HYDROCODONE-ACETAMINOPHEN 5-325 MG PO TABS: 1.0000 | ORAL_TABLET | Freq: Four times a day (QID) | ORAL | Status: DC | PRN

## 2012-07-31 NOTE — Patient Instructions (Signed)
It was nice to see you! Blood pressure - I have refilled your metoprolol - start taking lisinopril 10mg  daily - only take torsemide daily as needed for swelling - I have also given you a few Norco to help with the dental pain until you see the dentist. Right knee - this may be arthritis vs gout - take meloxicam daily for 1 week - take tylenol extra strength 2 tablets 3 times a day - if not improving after 1 week, please schedule an appointment to see me Smoking - you can set up an appointment to see Dr. Raymondo Band for smoking cessation counseling.  Just call the clinic and ask for a smoking cessation appt with him.  This is a free service.  Follow up in 2 weeks for a nurse appointment to recheck your blood pressure and labs.

## 2012-07-31 NOTE — Assessment & Plan Note (Signed)
Currently asymptomatic.  Intermittent swelling more likely related to obesity and prolonged standing at work.  On a beta blocker.  Will start ace-i for BP.  Torsemide refilled and instructed to use as needed for swelling.  CBC, ferritin and iron checked today.

## 2012-07-31 NOTE — Progress Notes (Signed)
  Subjective:    Patient ID: Mike Davenport, male    DOB: Mar 21, 1971, 42 y.o.   MRN: [id number]  HPI Mike Davenport is here for f/u htn, right knee pain, smoking cessation.  Hypertension Well controlled: no Compliant with medication: yes, does not always take torsemide when working Side effects from medication: no Check BP at home: no  Chest pain: no Palpitations: no Vision changes: no Leg edema: yes - intermittent Dizziness: no  Right knee pain Progressively worse since starting work 4 months ago.  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  Does have a history of gout in that knee.  Describes some "needle" sensation.   Pain mostly anterior knee.  Full range of motion.    Smoking Current smoker, interesting in quitting.  I have reviewed and updated the following as appropriate: allergies and current medications SHx: current smoker  Review of Systems See HPI    Objective:   Physical Exam BP 167/126  Pulse 69  Ht 5\' 11"  (1.803 m)  Wt 319 lb 14.4 oz (145.106 kg)  BMI 44.64 kg/m2 Gen: alert, cooperative, NAD HEENT: AT/[place], sclera white, MMM CV: RRR, no murmurs Pulm: CTAB, no wheezes or rales Ext: no edema Right knee: no erythema or joint effusion; full AROM, + crepitus, tender over anterior knee and medial/lateral joint lines     Assessment & Plan:

## 2012-07-31 NOTE — Assessment & Plan Note (Signed)
Uncontrolled.  Continue metoprolol.  Only take Torsemide as needed for swelling.  Will start lisinopril 10mg  daily, will likely need to increase this.  Will check BMP today.  Follow up in 2 weeks for RN appt for BP check and repeat labs.

## 2012-07-31 NOTE — Progress Notes (Signed)
Subjective:     Patient ID: Mike Davenport, male   DOB: [birth year], 42 y.o.   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  His main concerns today are HTN follow-up, right knee pain, smoking cessation, and tooth pain.  1. HTN follow-up - Patient states that he is fairly compliant with his blood pressure medications - Torsemide 40 mg and Metoprolol.  He does not check his blood pressure regularly, but states that he feels "like something is not right". Endorses frequent headaches and occasional lower extremity swelling.  Denies chest pain or dyspnea.  Notes that he had a wisdom tooth pulled approximately 2 weeks ago; this has caused him significant pain, and he wonders if this may be contributing to his increased blood pressure.  2. Right knee pain - Pain localized to the anterior aspect of the right knee.  Patient rates the pain as an "8" on a scale of 10.  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  PMHx of gout, OA, and obesity.  No trauma or injury related to onset.  He denies any fevers/chills, apparent swelling, or erythema of the joint.  3. Smoking cessation - Patient expresses interest in smoking cessation today.  Currently smokes 1/2 pack per day.  He was successful in quitting for about 1 year some time ago, but has smoked for approximately 20 years overall.    4. Tooth pain - Patient is status post wisdom tooth removal approximately 2 weeks ago.  He continues to have significant pain and is concerned that a remnant of tooth may have been left in the area of removal.  Taking Ibuprofen occasionally with little relief.  Denies fevers/chills or other indications of infection.   Review of Systems Negative except as noted in HPI.    Objective:   Physical Exam  Vitals reviewed. Constitutional: He is  oriented to person, place, and time. He appears well-developed and well-nourished. No distress.  BP 179/125; 167/126 on recheck.  Patient has gained 6 pounds since his last physical.  HENT:  Head: Normocephalic and atraumatic.  Mouth/Throat: Oropharynx is clear and moist.  No indication of infection in area of wisdom tooth removal.  Eyes: Conjunctivae are normal. Pupils are equal, round, and reactive to light.  Amblyopia of left eye. Patient is legally blind.  Neck: Neck supple. No JVD present. No thyromegaly present.  Cardiovascular: Normal rate, regular rhythm, normal heart sounds and intact distal pulses.   No murmur heard. Pulmonary/Chest: Effort normal and breath sounds normal. No respiratory distress. He has no wheezes.  Abdominal: Soft. Bowel sounds are normal. He exhibits no distension. There is no tenderness.  Musculoskeletal: Normal range of motion.       Right knee: He exhibits no swelling, no effusion, no deformity, no erythema and normal alignment. Tenderness found. Medial joint line and lateral joint line tenderness noted.  Diffuse tenderness over anterior aspect of right knee.  Neurological: He is alert and oriented to person, place, and time.  Skin: Skin is warm and dry. No rash noted.  Psychiatric: He has a normal mood and affect. His behavior is normal.       Assessment:     1. HTN 2. Right knee pain 3. Smoking cessation 4. Tooth pain s/p wisdom tooth extraction    Plan:   1. HTN -  Continue Metoprolol twice daily.  Patient educated to take Torsemide only when he notices lower extremity swelling.  Add Lisinopril to daily regimen.  He will follow up in 2 weeks for a nursing visit for blood pressure re-check and lab work.  2. Right knee pain - Likely multifactorial due to history of gout, OA, and obesity.  No recent trauma or injury.  Given prescription for Meloxicam to take daily for the next week, along with Tylenol (2 tablets 3 times daily).  If his pain worsens or  persists despite the Meloxicam regimen, he was instructed to make an appointment with [place] in 1 to 2 weeks for re-evaluation and possible steroid injection.  3. Smoking cessation - Referral to Dr. Raymondo Band for smoking cessation.  4. Tooth pain - s/p wisdom tooth extraction.  No signs of infection on exam.  Prescription for 15 tablets of Norco for pain until patient can follow-up with his dentist.  Dictated by Lonzo Candy, MS4.

## 2012-07-31 NOTE — Assessment & Plan Note (Signed)
OA vs gout.  Suspect OA given progressive course and lack of erythema and swelling.  Pain description could be consistent with gout.  Will start with conservative management with meloxicam 15mg  daily and tylenol 1g TID.  If no improvement in the next week, he will return for likely cortisone shot.

## 2012-07-31 NOTE — Telephone Encounter (Signed)
Formed completed and placed on Mike Davenport's desk.

## 2012-07-31 NOTE — Telephone Encounter (Signed)
Handicapped placard dropped off to be filled out.  Please call when completed.

## 2012-08-01 NOTE — Telephone Encounter (Signed)
Left message at [phone] that Handicap Placard is completed and ready to be picked up at front desk.Kathrine Cords, Nori Riis

## 2012-08-14 ENCOUNTER — Encounter: Payer: Self-pay | Admitting: *Deleted

## 2012-08-14 ENCOUNTER — Ambulatory Visit: Payer: Medicare Other | Admitting: *Deleted

## 2012-08-14 ENCOUNTER — Telehealth: Payer: Self-pay | Admitting: *Deleted

## 2012-08-14 VITALS — BP 162/109 | HR 69

## 2012-08-14 DIAGNOSIS — I10 Essential (primary) hypertension: Secondary | ICD-10-CM

## 2012-08-14 LAB — BASIC METABOLIC PANEL
Chloride: 105 mEq/L (ref 96–112)
Creat: 1.18 mg/dL (ref 0.50–1.35)
Potassium: 4.1 mEq/L (ref 3.5–5.3)

## 2012-08-14 NOTE — Progress Notes (Signed)
Patient here today for nurse visit to check BP and get labs (bmet).  BP today---162/109 (L) and P--69.  Patient reports having headaches in morning when he takes Torsemide.  BP not changed from previous office visit.  Will route note to Dr. Elwyn Reach.  Gaylene Brooks, RN

## 2012-08-14 NOTE — Telephone Encounter (Signed)
Called patient and informed to increase lisinopril to 20 mg.  Patient verbalized understanding and follow-up appt scheduled with Dr. Elwyn Reach for 09/01/12 at 2:30 pm.  Gaylene Brooks, RN

## 2012-08-14 NOTE — Telephone Encounter (Signed)
Message copied by Jose Persia on Mon Aug 14, 2012  4:27 PM ------      Message from: BOOTH, [place] J      Created: Mon Aug 14, 2012  3:48 PM       Can you have him increase the lisinopril to 20mg  daily and follow up with me in 2-4 weeks?        Thanks!      Erin       ----- Message -----         From: Gaylene Brooks, RN         Sent: 08/14/2012   9:38 AM           To: Phebe Colla, MD                   ------

## 2012-08-14 NOTE — Telephone Encounter (Signed)
This encounter was created in error - please disregard.

## 2012-08-21 ENCOUNTER — Other Ambulatory Visit: Payer: Self-pay | Admitting: *Deleted

## 2012-08-21 MED ORDER — ESOMEPRAZOLE MAGNESIUM 40 MG PO CPDR: 40.0000 mg | DELAYED_RELEASE_CAPSULE | Freq: Every day | ORAL | Status: DC

## 2012-08-28 ENCOUNTER — Emergency Department (HOSPITAL_COMMUNITY)
Admission: EM | Admit: 2012-08-28 | Discharge: 2012-08-29 | Disposition: A | Discharge status: Home / Self Care | Payer: Medicare Other | Attending: Emergency Medicine | Admitting: Emergency Medicine

## 2012-08-28 ENCOUNTER — Encounter (HOSPITAL_COMMUNITY): Payer: Self-pay | Admitting: Emergency Medicine

## 2012-08-28 DIAGNOSIS — S0990XS Unspecified injury of head, sequela: Secondary | ICD-10-CM

## 2012-08-28 DIAGNOSIS — M545 Low back pain, unspecified: Secondary | ICD-10-CM | POA: Insufficient documentation

## 2012-08-28 DIAGNOSIS — R569 Unspecified convulsions: Secondary | ICD-10-CM | POA: Insufficient documentation

## 2012-08-28 DIAGNOSIS — M5416 Radiculopathy, lumbar region: Secondary | ICD-10-CM

## 2012-08-28 DIAGNOSIS — Y92009 Unspecified place in unspecified non-institutional (private) residence as the place of occurrence of the external cause: Secondary | ICD-10-CM | POA: Insufficient documentation

## 2012-08-28 DIAGNOSIS — M109 Gout, unspecified: Secondary | ICD-10-CM | POA: Insufficient documentation

## 2012-08-28 DIAGNOSIS — E669 Obesity, unspecified: Secondary | ICD-10-CM | POA: Insufficient documentation

## 2012-08-28 DIAGNOSIS — F17200 Nicotine dependence, unspecified, uncomplicated: Secondary | ICD-10-CM | POA: Insufficient documentation

## 2012-08-28 DIAGNOSIS — S060X9A Concussion with loss of consciousness of unspecified duration, initial encounter: Secondary | ICD-10-CM

## 2012-08-28 DIAGNOSIS — Y999 Unspecified external cause status: Secondary | ICD-10-CM | POA: Insufficient documentation

## 2012-08-28 DIAGNOSIS — S0990XA Unspecified injury of head, initial encounter: Secondary | ICD-10-CM | POA: Insufficient documentation

## 2012-08-28 DIAGNOSIS — E785 Hyperlipidemia, unspecified: Secondary | ICD-10-CM | POA: Insufficient documentation

## 2012-08-28 DIAGNOSIS — Z79899 Other long term (current) drug therapy: Secondary | ICD-10-CM | POA: Insufficient documentation

## 2012-08-28 DIAGNOSIS — R404 Transient alteration of awareness: Secondary | ICD-10-CM

## 2012-08-28 DIAGNOSIS — G4733 Obstructive sleep apnea (adult) (pediatric): Secondary | ICD-10-CM | POA: Insufficient documentation

## 2012-08-28 DIAGNOSIS — G40A09 Absence epileptic syndrome, not intractable, without status epilepticus: Secondary | ICD-10-CM

## 2012-08-28 DIAGNOSIS — S060X1A Concussion with loss of consciousness of 30 minutes or less, initial encounter: Secondary | ICD-10-CM

## 2012-08-28 DIAGNOSIS — S060XAA Concussion with loss of consciousness status unknown, initial encounter: Secondary | ICD-10-CM

## 2012-08-28 DIAGNOSIS — G5600 Carpal tunnel syndrome, unspecified upper limb: Secondary | ICD-10-CM | POA: Insufficient documentation

## 2012-08-28 DIAGNOSIS — H548 Legal blindness, as defined in USA: Secondary | ICD-10-CM | POA: Insufficient documentation

## 2012-08-28 DIAGNOSIS — W010XXA Fall on same level from slipping, tripping and stumbling without subsequent striking against object, initial encounter: Secondary | ICD-10-CM | POA: Insufficient documentation

## 2012-08-28 DIAGNOSIS — S46812A Strain of other muscles, fascia and tendons at shoulder and upper arm level, left arm, initial encounter: Secondary | ICD-10-CM

## 2012-08-28 DIAGNOSIS — IMO0002 Reserved for concepts with insufficient information to code with codable children: Secondary | ICD-10-CM | POA: Insufficient documentation

## 2012-08-28 DIAGNOSIS — R110 Nausea: Secondary | ICD-10-CM | POA: Insufficient documentation

## 2012-08-28 MED ORDER — OXYCODONE-ACETAMINOPHEN 5-325 MG PO TABS
2.0000 | ORAL_TABLET | Freq: Once | ORAL | Status: AC
  Administered 2012-08-28: 2 via ORAL
  Filled 2012-08-28: qty 2

## 2012-08-28 NOTE — ED Notes (Signed)
PT.'S FIANCEE REPORTED THAT PT.  SLIPPED AND  FELL AT WORK LAST Thursday , NO LOC /AMBULATORY , REPORTS PAIN AT BACK OF HEAD /LOW BACK PAIN WITH " CLAMMY / BLANK STARES" TODAY , PT. IS ALERT AND ORIENTED , SPEECH CLEAR , EQUAL STRONG GRIPS /AMBULATORY.

## 2012-08-28 NOTE — ED Provider Notes (Signed)
History     CSN: [id number]  Arrival date & time 08/28/12  2255   First MD Initiated Contact with Patient 08/28/12 2324      Chief Complaint  Patient presents with  . Fall  . Headache  . Back Pain   HPI Mike Davenport is a 42 y.o. male who fell and hit his head last Thursday. L. and a bathroom on a wet floor denies losing consciousness and was checked out at urgent care and had some kind of imaging performed at that time. Since then has been "slow", and having occasional headaches, is also having some left trapezius pain, left-sided paraspinous muscle and lumbar region. This pain in the lumbar region radiates down the back of the thigh, it is moderate to severe, is sharp.  Earlier this evening patient had a 10-15 minutes staring spell per his significant other for he would not respond to her afterward, he was slightly confused and more sluggish than usual.  In addition, he also had some mild retrograde amnesia and did not remember anything that prior to his staring spell. No weakness, numbness, dysarthria.  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  Denies fever, chills, chest pain, shortness of breath, vomiting or diarrhea.  Has had nausea 2 days ago.  Past Medical History  Diagnosis Date  . Hyperlipidemia   . Hypertension   . Obesity   . OSA (obstructive sleep apnea)   . Gout   . Legal blindness   . Knee pain, bilateral   . Tobacco abuse   . Carpal tunnel syndrome   . Condylomata acuminata in male     Past Surgical History  Procedure Laterality Date  . 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     Family History  Problem Relation Age of Onset  . Diabetes Mother     History  Substance Use Topics  . Smoking status: Current  Every Day Smoker -- 0.50 packs/day    Types: Cigarettes  . Smokeless tobacco: Never Used     Comment: decreased smoking  . Alcohol Use: No      Review of Systems At least 10pt or greater review of systems completed and are negative except where specified in the HPI.  Allergies  Other  Home Medications   Current Outpatient Rx  Name  Route  Sig  Dispense  Refill  . esomeprazole (NEXIUM) 40 MG capsule   Oral   Take 1 capsule (40 mg total) by mouth daily before breakfast.   30 capsule   3   . guaiFENesin (MUCINEX) 600 MG 12 hr tablet   Oral   Take 1,200 mg by mouth 2 (two) times daily as needed for congestion.         Marland Kitchen HYDROcodone-acetaminophen (NORCO) 5-325 MG per tablet   Oral   Take 1 tablet by mouth every 6 (six) hours as needed for pain.   15 tablet   0   . lisinopril (PRINIVIL,ZESTRIL) 10 MG tablet   Oral   Take 1 tablet (10 mg total) by mouth daily.   90 tablet   3   . meloxicam (MOBIC) 15 MG tablet   Oral   Take 1 tablet (15  mg total) by mouth daily. Daily for 1 week, then as needed   30 tablet   0   . metoprolol (LOPRESSOR) 100 MG tablet   Oral   Take 1 tablet (100 mg total) by mouth 2 (two) times daily.   60 tablet   11   . torsemide (DEMADEX) 20 MG tablet   Oral   Take 1 tablet (20 mg total) by mouth daily as needed (swelling).   30 tablet   3     BP 160/104  Temp(Src) 98.3 F (36.8 C) (Oral)  Resp 18  SpO2 99%  Physical Exam  PHYSICAL EXAM: VITAL SIGNS:  . Filed Vitals:   08/28/12 2317  BP: 160/104  Temp: 98.3 F (36.8 C)  TempSrc: Oral  Resp: 18  SpO2: 99%   CONSTITUTIONAL: Awake, oriented, appears non-toxic HENT: Atraumatic, normocephalic, oral mucosa pink and moist, airway patent. Nares patent without drainage. External ears normal. EYES: Conjunctiva clear, EOMI, PERRLA NECK: Trachea midline, non-tender, supple CARDIOVASCULAR: Normal heart rate, Normal rhythm, No murmurs, rubs, gallops PULMONARY/CHEST: Clear to  auscultation, no rhonchi, wheezes, or rales. Symmetrical breath sounds. CHEST WALL: No lesions. Non-tender. ABDOMINAL: Non-distended, soft, non-tender - no rebound or guarding.  BS normal. NEUROLOGIC: ZO:XWRUEA fields intact. PERRLA, EOMI.  Facial sensation equal to light touch bilaterally.  Good muscle bulk in the masseter muscle and good lateral movement of the jaw.  Facial expressions equal and good strength with smile/frown and puffed cheeks.  Hearing grossly intact to finger rub test.  Uvula, tongue are midline with no deviation. Symmetrical palate elevation.  Trapezius and SCM muscles are 5/5 strength bilaterally.   DTR: Brachioradialis, biceps, patellar, Achilles tendon reflexes 2+ bilaterally.  No clonus. Strength: 5/5 strength flexors and extensors in the upper and lower extremities.  Grip strength, finger adduction/abduction 5/5. Sensation: Sensation intact distally to light touch, proprioception using position testing of 2nd digit and great toe Cerebellar: No dysmetria with finger to nose - some difficulty with depth perception given his loss of vision in one eye, rapid alternating hand movements and heels to shin testing. EXTREMITIES: No clubbing, cyanosis, or edema SKIN: Warm, Dry, No erythema, No rash   ED Course  Procedures (including critical care time)  Labs Reviewed  POCT I-STAT, CHEM 8 - Abnormal; Notable for the following:    Creatinine, Ser 1.40 (*)    Hemoglobin 17.3 (*)    All other components within normal limits   Dg Lumbar Spine Complete  08/29/2012  *RADIOLOGY REPORT*  Clinical Data: Fall, headache, back pain  LUMBAR SPINE - COMPLETE 4+ VIEW  Comparison: None.  Findings: Normal alignment of lumbar vertebral bodies.  There is endplate osteophytosis at L4-L5 with associated endplate sclerosis. No evidence of subluxation.  Oblique projection demonstrates no pars fracture.  IMPRESSION: No acute findings lumbar spine.  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   Original Report  Authenticated By: Genevive Bi, M.D.    Ct Head Wo Contrast  08/29/2012  *RADIOLOGY REPORT*  Clinical Data: 42 year old male status post fall.  Pain.  Blurred vision.  CT HEAD WITHOUT CONTRAST  Technique:  Contiguous axial images were obtained from the base of the skull through the vertex without contrast.  Comparison: None.  Findings: Left mastoids and tympanic cavity are opacified.  Other Visualized paranasal sinuses and mastoids are clear.  Visualized orbits and scalp soft tissues are within normal limits.  No acute osseous abnormality identified.  Cerebral volume is within normal limits for age.  No midline shift, ventriculomegaly, mass  effect, evidence of mass lesion, intracranial hemorrhage or evidence of cortically based acute infarction.  Gray-white matter differentiation is within normal limits throughout the brain.  No suspicious intracranial vascular hyperdensity.  IMPRESSION: 1. Normal noncontrast CT appearance of the brain. 2.  Left mastoid and tympanic cavity opacification, such as due to otitis media or cholesteatoma.  Recommend nonemergent ENT follow- up.   Original Report Authenticated By: Erskine Speed, M.D.      1. Concussion, with loss of consciousness of 30 minutes or less, initial encounter   2. Transient alteration of awareness   3. Closed head injury without loss of consciousness, sequela   4. Seizure, absence   5. Acute radicular low back pain   6. Trapezius muscle strain, left, initial encounter       MDM  Mike Davenport is a 42 y.o. male who presents 4 days after a fall, with concussive type symptoms.  Patient has had mental slowness, headaches, nausea which are all symptoms of closed head injury. There is consideration that the patient may have had a delayed bleed especially with history of hypertension and possible absence seizure. Will repeat head CT.    Repeat head CT is nonacute, there is no acute bleed. Reviewed patient's lumbar spine series, there is no bony  fracture. Patient does have some osteophytic disease at L4-L5, do not think this is causing the patient's back pain, that his radicular back pain is largely exacerbated by his recent fall, he is obese.  Give the patient some medicine to treat his radicular back pain and back spasm.  Otherwise labs are unremarkable.    Dr. Amada Jupiter saw the patient and thinks patient may have had a postconcussive convulsion, on his recommendations we'll place the patient on Keppra prophylaxis for 2 weeks and have him followup with neurology.    Return to the ER for any concerning symptoms, patient and significant other discharged home stable and good condition have answered their questions        Jones Skene, MD 08/29/12 [phone]

## 2012-08-28 NOTE — ED Notes (Signed)
OS Pt is blind in this eye OD  20/800 OU  20/800

## 2012-08-28 NOTE — ED Notes (Addendum)
Pt states he had a fall on 08/24/12 at work and hit the left side of his head. Fiance states since fall pt has been having episodes that have been described as of staring for 10-15 minute that consist of pt not responding to verbal stimuli.After episodes pt does not remember what happen before. Pt also states he is having head, neck and back pain pt is also complaining of blurred vision since fall. Pt has had previous MRI that came back negative from urgent care in [facility].

## 2012-08-29 ENCOUNTER — Emergency Department (HOSPITAL_COMMUNITY): Payer: Medicare Other

## 2012-08-29 DIAGNOSIS — R404 Transient alteration of awareness: Secondary | ICD-10-CM

## 2012-08-29 DIAGNOSIS — S060XAA Concussion with loss of consciousness status unknown, initial encounter: Secondary | ICD-10-CM

## 2012-08-29 DIAGNOSIS — S060X9A Concussion with loss of consciousness of unspecified duration, initial encounter: Secondary | ICD-10-CM

## 2012-08-29 DIAGNOSIS — S060X1A Concussion with loss of consciousness of 30 minutes or less, initial encounter: Secondary | ICD-10-CM

## 2012-08-29 LAB — POCT I-STAT, CHEM 8
Chloride: 105 mEq/L (ref 96–112)
Creatinine, Ser: 1.4 mg/dL — ABNORMAL HIGH (ref 0.50–1.35)
Glucose, Bld: 96 mg/dL (ref 70–99)
HCT: 51 % (ref 39.0–52.0)
Potassium: 3.8 mEq/L (ref 3.5–5.1)
Sodium: 142 mEq/L (ref 135–145)

## 2012-08-29 MED ORDER — HYDROCODONE-ACETAMINOPHEN 5-325 MG PO TABS: 1.0000 | ORAL_TABLET | Freq: Four times a day (QID) | ORAL | Status: DC | PRN

## 2012-08-29 MED ORDER — IBUPROFEN 600 MG PO TABS: 600.0000 mg | ORAL_TABLET | Freq: Four times a day (QID) | ORAL | Status: DC | PRN

## 2012-08-29 MED ORDER — LEVETIRACETAM 500 MG PO TABS: 500.0000 mg | ORAL_TABLET | Freq: Two times a day (BID) | ORAL | Status: DC

## 2012-08-29 MED ORDER — DIAZEPAM 2 MG PO TABS: 2.0000 mg | ORAL_TABLET | Freq: Three times a day (TID) | ORAL | Status: DC | PRN

## 2012-08-29 NOTE — Consult Note (Addendum)
Reason for Consult: Staring spells Referring Physician: Nichola Sizer  CC: Staring Spell.   History is obtained from: Patient  HPI: Mike Davenport is a 42 y.o. male with a history of severe vision loss who fell and hit his head last Thursday. Following this, he had decreased vision, headaches, gait unsteadiness, slowness to respond. He was evaluated at an urgent care on May 1 and had an MRI at that time which was reported to the patient as normal. Unfortunately, I do not have access to the records of that visit.  Since that episode, his vision has been improving though still blurred. He has significant loss of peripheral vision at baseline, but his central vision has previously been less blurred than it is currently.  Today, he had an episode for about 10 minutes where he was not responding to his wife. She states the following this episode, he did seem more confused than he has over the previous days. She did not try physically stimulating him, but did call his name repeatedly.  He also has been complaining of back pain since that time that shoots down into his left leg.  ROS: A 14 point ROS was performed and is negative except as noted in the HPI.  Past Medical History  Diagnosis Date  . Hyperlipidemia   . Hypertension   . Obesity   . OSA (obstructive sleep apnea)   . Gout   . Legal blindness   . Knee pain, bilateral   . Tobacco abuse   . Carpal tunnel syndrome   . Condylomata acuminata in male     Family History: No history of seizures  Social History: Tob: Positive smoker  Exam: Current vital signs: BP 160/104  Temp(Src) 98.3 F (36.8 C) (Oral)  Resp 18  SpO2 99% Vital signs in last 24 hours: Temp:  [98.3 F (36.8 C)] 98.3 F (36.8 C) (05/05 2317) Resp:  [18] 18 (05/05 2317) BP: (160)/(104) 160/104 mmHg (05/05 2317) SpO2:  [99 %] 99 % (05/05 2317)  General: In bed, NAD CV: Regular rate and rhythm Mental Status: Patient is awake, alert, oriented to person,  place, month, year, and situation. Immediate and remote memory are intact. Patient is able to give a clear and coherent history. No signs of aphasia or neglect. Cranial Nerves: II: Visual Fields are markedly reduced in the periphery in all fields. He is unable to finger count, but able to correctly identify a pen and flashlight. Right pupil is reactive to light, left is sluggish, but does appear to react a litttle. Funduscopic exam on the right shows possible mild disc pallor. III,IV, VI:  V: Facial sensation is symmetric to temperature VII: Facial movement is symmetric.  VIII: hearing is intact to voice X: Uvula elevates symmetrically XI: Shoulder shrug is symmetric. XII: tongue is midline without atrophy or fasciculations.  Motor: Tone is normal. Bulk is normal. 5/5 strength was present in all four extremities with the execption of hip flexion, knee extension on the left that  is limited by pain  Sensory: Sensation is symmetric to light touch and temperature in the arms and legs, no clear radicular distribution numbness Deep Tendon Reflexes: 2+ and symmetric in the biceps and patellae Cerebellar: FNF intact bilaterally Gait: Patient has a stable casual gait, though appears limited by pain.   I have reviewed labs in epic and the results pertinent to this consultation are: Mildly elevated Cr  Impression: 42 yo M with head injury with subsequent blurred vision, headaches, nausea, and  a single staring spell. I suspect tha the is recovering from a concussion.    It is not clear that this spell represented seizure activity, though it is possible. If it does represent seizure, this would be considered a provoked seizure in the setting of recent head injury and may not mean that he is going to have epilepsy, though there is a risk of this. I do feel that in the short term, he is at increased risk of seizure and therefore a brief course of antiepileptic therapy would be  reasonable.  Recommendations: 1) Agree with CT head 2) Keppra 500mg  BID for two weeks. If no spells during that time, would d/c therapy.  3) If further spells do occur, particularly if prolonged, I advised him and his wife to return to the ER.  4) EEG as an outpatient and patient will need to schedule  follow up with neurology.   5) Could consider low back  Xray if not performed at urgent care.     Patient is unable to drive, operate heavy machinery, perform activities at heights or participate in water activities until release by outpatient physician. This was discussed with the patient who expressed understanding.   Ritta Slot, MD [facility] [phone]  If 7pm- 7am, please page neurology on call at [phone].

## 2012-08-29 NOTE — ED Notes (Signed)
Pt continues to c/o lower back pain.  St's no relief from pain med.

## 2012-09-01 ENCOUNTER — Ambulatory Visit (INDEPENDENT_AMBULATORY_CARE_PROVIDER_SITE_OTHER): Payer: Medicare Other | Admitting: Emergency Medicine

## 2012-09-01 ENCOUNTER — Encounter: Payer: Self-pay | Admitting: Emergency Medicine

## 2012-09-01 VITALS — BP 165/113 | HR 73 | Ht 69.0 in | Wt 330.0 lb

## 2012-09-01 DIAGNOSIS — Z5189 Encounter for other specified aftercare: Secondary | ICD-10-CM

## 2012-09-01 DIAGNOSIS — S060X0D Concussion without loss of consciousness, subsequent encounter: Secondary | ICD-10-CM

## 2012-09-01 DIAGNOSIS — I10 Essential (primary) hypertension: Secondary | ICD-10-CM

## 2012-09-01 MED ORDER — CYCLOBENZAPRINE HCL 5 MG PO TABS: 5.0000 mg | ORAL_TABLET | Freq: Three times a day (TID) | ORAL | Status: DC | PRN

## 2012-09-01 MED ORDER — AMLODIPINE BESYLATE 5 MG PO TABS: 5.0000 mg | ORAL_TABLET | Freq: Every day | ORAL | Status: DC

## 2012-09-01 NOTE — Patient Instructions (Signed)
It was nice to see you!  I'm sorry you are having trouble. For the head injury - this is likely post-concussive syndrome - it is important for you to see the Worker's [facility] neurologist next week. - you should not work until you see the neurologist - please rest as much as possible.  Do not do anything that makes your headache or stutter worse. For your blood pressure - STOP the lisinopril - START amlodipine 5mg  daily  Follow up with me for your blood pressure in 2 weeks.

## 2012-09-01 NOTE — Assessment & Plan Note (Signed)
Likely with post-concussive syndrome.  Two normal CT scans in the last week.  No focal neurologic findings.  Discussed diagnosis and that recovery can take weeks.  Reviewed no work until symptoms resolve.  Recommended physical and mental rest until symptoms improve.  He should f/u with the Worker's [employer] neurologist in the next week.

## 2012-09-01 NOTE — Assessment & Plan Note (Signed)
Uncontrolled.  Creatinine bumped with lisinopril.  Will stop lisinopril and start amlodipine 5mg  daily.  Follow up in 2 weeks.

## 2012-09-01 NOTE — Progress Notes (Signed)
  Subjective:    Patient ID: Mike Davenport, male    DOB: [birth year], 42 y.o.   MRN: [id number]  HPI Mike Davenport is here for f/u HTN and concussion.  Hypertension Well controlled: no Compliant with medication: yes Side effects from medication: no Check BP at home: no  Chest pain: no Palpitations: no Vision changes: yes - see below Leg edema: no Dizziness: no  Concussion Patient reports a fall at work last Thursday where he hit his head on a tile floor and twisted his back.  He was seen that night by his employer and a head CT was negative.  Since the fall, he has had headaches (diffuse, feels like his head is about to explode, worse in mornings, comes and goes, no nausea), a new stutter, and some difficulty with word finding.  Also with blurry vision.  He has disconjugate gaze at baseline with vision in his right eye only.  Prior to the fall his vision was okay but had poor peripheral vision, now with blurred vision.  On Monday, his girlfriend reported a "staring spell" of 10-15 minutes and took him to the ER.  Repeat CT scan was again normal.  He was seen by neurology in the ED and start on a 2 week course of keppra.  He reports that every time he takes that medicine, he throws up.  Denies any further staring spells.  He does state that [employer] is setting up an appointment for him to see a neurologist.   I have reviewed and updated the following as appropriate: allergies and current medications SHx: current smoker  Review of Systems See HPI    Objective:   Physical Exam BP 165/113  Pulse 73  Ht 5\' 9"  (1.753 m)  Wt 330 lb (149.687 kg)  BMI 48.71 kg/m2 Gen: alert, cooperative, NAD, speaks with stuttter, occasionally has difficulty getting the words out HEENT: AT/[place], sclera white, PERRL, MMM Neck: supple CV: RRR, no murmurs Pulm: CTAB, no wheezes or rales Neuro: CN II-XII intact bilaterally; strength 5/5 in all extremities, sensation intact to light touch  throughout; biceps, patellar and achilles reflexes are 2+ and symmetric     Assessment & Plan:

## 2012-09-13 ENCOUNTER — Ambulatory Visit: Payer: Medicare Other | Admitting: Emergency Medicine

## 2012-09-29 ENCOUNTER — Ambulatory Visit: Payer: Medicare Other | Admitting: Emergency Medicine

## 2012-12-26 ENCOUNTER — Other Ambulatory Visit: Payer: Self-pay | Admitting: *Deleted

## 2012-12-26 MED ORDER — ESOMEPRAZOLE MAGNESIUM 40 MG PO CPDR: 40.0000 mg | DELAYED_RELEASE_CAPSULE | Freq: Every day | ORAL | Status: DC

## 2013-02-05 ENCOUNTER — Encounter (HOSPITAL_COMMUNITY): Payer: Self-pay | Admitting: Emergency Medicine

## 2013-02-05 DIAGNOSIS — F17200 Nicotine dependence, unspecified, uncomplicated: Secondary | ICD-10-CM | POA: Insufficient documentation

## 2013-02-05 DIAGNOSIS — Q158 Other specified congenital malformations of eye: Secondary | ICD-10-CM | POA: Insufficient documentation

## 2013-02-05 DIAGNOSIS — H53149 Visual discomfort, unspecified: Secondary | ICD-10-CM | POA: Insufficient documentation

## 2013-02-05 DIAGNOSIS — F0781 Postconcussional syndrome: Secondary | ICD-10-CM | POA: Insufficient documentation

## 2013-02-05 DIAGNOSIS — Z8619 Personal history of other infectious and parasitic diseases: Secondary | ICD-10-CM | POA: Insufficient documentation

## 2013-02-05 DIAGNOSIS — G40909 Epilepsy, unspecified, not intractable, without status epilepticus: Secondary | ICD-10-CM | POA: Insufficient documentation

## 2013-02-05 DIAGNOSIS — R51 Headache: Secondary | ICD-10-CM | POA: Insufficient documentation

## 2013-02-05 DIAGNOSIS — I10 Essential (primary) hypertension: Secondary | ICD-10-CM | POA: Insufficient documentation

## 2013-02-05 DIAGNOSIS — Z79899 Other long term (current) drug therapy: Secondary | ICD-10-CM | POA: Insufficient documentation

## 2013-02-05 DIAGNOSIS — Z8739 Personal history of other diseases of the musculoskeletal system and connective tissue: Secondary | ICD-10-CM | POA: Insufficient documentation

## 2013-02-05 DIAGNOSIS — E669 Obesity, unspecified: Secondary | ICD-10-CM | POA: Insufficient documentation

## 2013-02-05 DIAGNOSIS — R110 Nausea: Secondary | ICD-10-CM | POA: Insufficient documentation

## 2013-02-05 DIAGNOSIS — H548 Legal blindness, as defined in USA: Secondary | ICD-10-CM | POA: Insufficient documentation
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
## 2013-02-06 ENCOUNTER — Emergency Department (HOSPITAL_COMMUNITY)
Admission: EM | Admit: 2013-02-06 | Discharge: 2013-02-06 | Disposition: A | Discharge status: Home / Self Care | Payer: Worker's Compensation | Attending: Emergency Medicine | Admitting: Emergency Medicine

## 2013-02-06 DIAGNOSIS — R519 Headache, unspecified: Secondary | ICD-10-CM

## 2013-02-06 DIAGNOSIS — F0781 Postconcussional syndrome: Secondary | ICD-10-CM

## 2013-02-06 MED ORDER — DIPHENHYDRAMINE HCL 50 MG/ML IJ SOLN
25.0000 mg | Freq: Once | INTRAMUSCULAR | Status: AC
  Administered 2013-02-06: 25 mg via INTRAVENOUS
  Filled 2013-02-06: qty 1

## 2013-02-06 MED ORDER — METOCLOPRAMIDE HCL 5 MG/ML IJ SOLN
10.0000 mg | Freq: Once | INTRAMUSCULAR | Status: AC
  Administered 2013-02-06: 10 mg via INTRAVENOUS
  Filled 2013-02-06: qty 2

## 2013-02-06 MED ORDER — KETOROLAC TROMETHAMINE 30 MG/ML IJ SOLN
30.0000 mg | Freq: Once | INTRAMUSCULAR | Status: AC
  Administered 2013-02-06: 30 mg via INTRAVENOUS
  Filled 2013-02-06: qty 1

## 2013-02-06 NOTE — ED Notes (Signed)
Pt ambulatory to b/r, steady gait, wife at side.

## 2013-02-06 NOTE — ED Notes (Signed)
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

## 2013-02-06 NOTE — ED Notes (Signed)
Dr. Otter at [facility]. 

## 2013-02-06 NOTE — ED Provider Notes (Signed)
CSN: [id number]     Arrival date & time 02/05/13  2301 History   First MD Initiated Contact with Patient 02/06/13 0057     Chief Complaint  Patient presents with  . Headache   (Consider location/radiation/quality/duration/timing/severity/associated sxs/prior Treatment) HPI 42 year old male presents emergency department from home with complaint of headache.  Patient has had daily headaches since having a concussion in may.  Denies headache started about 4 hours prior to my evaluation, and is worse than his normal headaches.  Headache is on left side of his head, extending posteriorly, and into his neck.  He has had nausea and photophobia.  No vomiting, no stiffness to his neck, no fevers.  Pain came on gradually, not a thunderclap headache.  Patient is currently being seen by a neurologist, and has a followup appointment on Friday.  Bili was concerned as his headache got severe, and did not get better with ibuprofen.  Patient also has history of seizure disorder.  Family reports brief staring spell earlier in the evening.  Past Medical History  Diagnosis Date  . Hyperlipidemia   . Hypertension   . Obesity   . OSA (obstructive sleep apnea)   . Gout   . Legal blindness   . Knee pain, bilateral   . Tobacco abuse   . Carpal tunnel syndrome   . Condylomata acuminata in male    Past Surgical History  Procedure Laterality Date  . 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    Family History  Problem Relation Age of Onset  . Diabetes Mother    History  Substance Use Topics  . Smoking status: Current Every Day Smoker -- 0.50 packs/day    Types: Cigarettes  . Smokeless tobacco: Never Used     Comment: decreased smoking  . Alcohol Use: No    Review of Systems  See History of Present Illness; otherwise all other systems are reviewed and negative Allergies  Other  Home Medications   Current Outpatient Rx  Name   Route  Sig  Dispense  Refill  . amLODipine (NORVASC) 5 MG tablet   Oral   Take 1 tablet (5 mg total) by mouth daily.   90 tablet   3   . divalproex (DEPAKOTE ER) 500 MG 24 hr tablet   Oral   Take 1,000 mg by mouth at bedtime.         Marland Kitchen esomeprazole (NEXIUM) 40 MG capsule   Oral   Take 1 capsule (40 mg total) by mouth daily before breakfast.   30 capsule   3   . ibuprofen (ADVIL,MOTRIN) 200 MG tablet   Oral   Take 200 mg by mouth every 6 (six) hours as needed for pain.         Marland Kitchen levETIRAcetam (KEPPRA) 500 MG tablet   Oral   Take 1 tablet (500 mg total) by mouth 2 (two) times daily.   28 tablet   0   . metoprolol (LOPRESSOR) 100 MG tablet   Oral   Take 1 tablet (100 mg total) by mouth 2 (two) times daily.   60 tablet   11   . topiramate (TOPAMAX) 25 MG tablet   Oral   Take 50 mg by mouth 2 (two) times daily.         Marland Kitchen torsemide (DEMADEX) 20 MG tablet   Oral   Take 1 tablet (20 mg total) by mouth  daily as needed (swelling).   30 tablet   3    BP 132/82  Pulse 71  Temp(Src) 97.4 F (36.3 C) (Oral)  Resp 18  Ht 5\' 8"  (1.727 m)  Wt 350 lb (158.759 kg)  BMI 53.23 kg/m2  SpO2 98% Physical Exam  Nursing note and vitals reviewed. Constitutional: He is oriented to person, place, and time. He appears well-developed and well-nourished.  HENT:  Head: Normocephalic and atraumatic.  Right Ear: External ear normal.  Left Ear: External ear normal.  Nose: Nose normal.  Mouth/Throat: Oropharynx is clear and moist.  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  Neck: Normal range of motion. Neck supple. No JVD present. No tracheal deviation present. No thyromegaly present.  Cardiovascular: Normal rate, regular rhythm, normal heart sounds and intact distal pulses.  Exam reveals no gallop and no friction rub.   No murmur heard. Pulmonary/Chest: Effort normal and breath sounds normal. No stridor. No respiratory distress. He has no wheezes. He has no  rales. He exhibits no tenderness.  Abdominal: Soft. Bowel sounds are normal. He exhibits no distension and no mass. There is no tenderness. There is no rebound and no guarding.  Musculoskeletal: Normal range of motion. He exhibits no edema and no tenderness.  Lymphadenopathy:    He has no cervical adenopathy.  Neurological: He is alert and oriented to person, place, and time. He has normal reflexes. No cranial nerve deficit. He exhibits normal muscle tone. Coordination normal.  Skin: Skin is warm and dry. No rash noted. No erythema. No pallor.  Psychiatric: He has a normal mood and affect. His behavior is normal. Judgment and thought content normal.    ED Course  Procedures (including critical care time) Labs Review Labs Reviewed - No data to display Imaging Review No results found.  EKG Interpretation   None       MDM   1. Headache disorder   2. Post-concussion syndrome    42 year old male with acute on chronic headache.  He is feeling better after headache cocktail.  He has good followup with neurology this Friday.  Family given precautions for return.    Olivia Mackie, MD 02/06/13 [id number]

## 2013-02-06 NOTE — ED Notes (Signed)
C/o HA, pinpoints to R side, also some dizziness, nausea and visual changes (more blurriness in R eye), legally blind (100% in L), reports h/o head injury and sz. Has not seen eye doctor since HAs have begun. Has been seen by neurologist in [place] since HAs. Have HAs every day, worse tonight, onset 2hrs PTA. (Denies: dental issues, vd or fever), wife at [facility]

## 2013-04-30 ENCOUNTER — Other Ambulatory Visit: Payer: Self-pay | Admitting: Emergency Medicine

## 2013-04-30 DIAGNOSIS — I10 Essential (primary) hypertension: Secondary | ICD-10-CM

## 2013-04-30 MED ORDER — TORSEMIDE 20 MG PO TABS: 20.0000 mg | ORAL_TABLET | Freq: Every day | ORAL | Status: DC | PRN

## 2013-05-14 ENCOUNTER — Emergency Department (HOSPITAL_COMMUNITY): Payer: Medicare Other

## 2013-05-14 ENCOUNTER — Encounter (HOSPITAL_COMMUNITY): Payer: Self-pay | Admitting: Emergency Medicine

## 2013-05-14 ENCOUNTER — Emergency Department (HOSPITAL_COMMUNITY)
Admission: EM | Admit: 2013-05-14 | Discharge: 2013-05-14 | Disposition: A | Discharge status: Home / Self Care | Payer: Medicare Other | Attending: Emergency Medicine | Admitting: Emergency Medicine

## 2013-05-14 DIAGNOSIS — Z8619 Personal history of other infectious and parasitic diseases: Secondary | ICD-10-CM | POA: Insufficient documentation

## 2013-05-14 DIAGNOSIS — F17200 Nicotine dependence, unspecified, uncomplicated: Secondary | ICD-10-CM | POA: Diagnosis not present

## 2013-05-14 DIAGNOSIS — R51 Headache: Secondary | ICD-10-CM

## 2013-05-14 DIAGNOSIS — Z79899 Other long term (current) drug therapy: Secondary | ICD-10-CM | POA: Insufficient documentation

## 2013-05-14 DIAGNOSIS — R0789 Other chest pain: Secondary | ICD-10-CM | POA: Diagnosis not present

## 2013-05-14 DIAGNOSIS — E669 Obesity, unspecified: Secondary | ICD-10-CM | POA: Insufficient documentation

## 2013-05-14 DIAGNOSIS — R002 Palpitations: Secondary | ICD-10-CM | POA: Diagnosis not present

## 2013-05-14 DIAGNOSIS — I10 Essential (primary) hypertension: Secondary | ICD-10-CM | POA: Insufficient documentation

## 2013-05-14 DIAGNOSIS — G44309 Post-traumatic headache, unspecified, not intractable: Secondary | ICD-10-CM | POA: Insufficient documentation

## 2013-05-14 DIAGNOSIS — F0781 Postconcussional syndrome: Secondary | ICD-10-CM | POA: Insufficient documentation

## 2013-05-14 DIAGNOSIS — R079 Chest pain, unspecified: Secondary | ICD-10-CM | POA: Diagnosis not present

## 2013-05-14 DIAGNOSIS — R519 Headache, unspecified: Secondary | ICD-10-CM

## 2013-05-14 DIAGNOSIS — M546 Pain in thoracic spine: Secondary | ICD-10-CM | POA: Insufficient documentation

## 2013-05-14 DIAGNOSIS — H548 Legal blindness, as defined in USA: Secondary | ICD-10-CM | POA: Insufficient documentation

## 2013-05-14 LAB — CBC
HEMATOCRIT: 45.9 % (ref 39.0–52.0)
HEMOGLOBIN: 16.3 g/dL (ref 13.0–17.0)
MCH: 29.1 pg (ref 26.0–34.0)
MCHC: 35.5 g/dL (ref 30.0–36.0)
MCV: 81.8 fL (ref 78.0–100.0)
Platelets: 199 10*3/uL (ref 150–400)
RBC: 5.61 MIL/uL (ref 4.22–5.81)
RDW: 14.8 % (ref 11.5–15.5)
WBC: 9.9 10*3/uL (ref 4.0–10.5)

## 2013-05-14 LAB — BASIC METABOLIC PANEL
BUN: 12 mg/dL (ref 6–23)
CHLORIDE: 104 meq/L (ref 96–112)
CO2: 25 mEq/L (ref 19–32)
Calcium: 9.3 mg/dL (ref 8.4–10.5)
Creatinine, Ser: 1.49 mg/dL — ABNORMAL HIGH (ref 0.50–1.35)
GFR calc non Af Amer: 57 mL/min — ABNORMAL LOW (ref >90)
GFR, EST AFRICAN AMERICAN: 66 mL/min — AB (ref >90)
GLUCOSE: 109 mg/dL — AB (ref 70–99)
POTASSIUM: 4.4 meq/L (ref 3.7–5.3)
Sodium: 141 mEq/L (ref 137–147)

## 2013-05-14 LAB — POCT I-STAT TROPONIN I: Troponin i, poc: 0 ng/mL (ref 0.00–0.08)

## 2013-05-14 MED ORDER — SODIUM CHLORIDE 0.9 % IV BOLUS (SEPSIS)
1000.0000 mL | Freq: Once | INTRAVENOUS | Status: AC
Start: 2013-05-14 — End: 2013-05-14
  Administered 2013-05-14: 1000 mL via INTRAVENOUS

## 2013-05-14 MED ORDER — KETOROLAC TROMETHAMINE 30 MG/ML IJ SOLN
30.0000 mg | Freq: Once | INTRAMUSCULAR | Status: AC
  Administered 2013-05-14: 30 mg via INTRAVENOUS
  Filled 2013-05-14: qty 1

## 2013-05-14 MED ORDER — METOCLOPRAMIDE HCL 5 MG/ML IJ SOLN
10.0000 mg | Freq: Once | INTRAMUSCULAR | Status: AC
  Administered 2013-05-14: 10 mg via INTRAVENOUS
  Filled 2013-05-14: qty 2

## 2013-05-14 MED ORDER — DIPHENHYDRAMINE HCL 50 MG/ML IJ SOLN
25.0000 mg | Freq: Once | INTRAMUSCULAR | Status: AC
  Administered 2013-05-14: 25 mg via INTRAVENOUS
  Filled 2013-05-14: qty 1

## 2013-05-14 MED ORDER — HYDROCODONE-ACETAMINOPHEN 5-325 MG PO TABS: 2.0000 | ORAL_TABLET | ORAL | Status: DC | PRN

## 2013-05-14 NOTE — ED Notes (Signed)
Pt states that this am had a bad headache and noticed like heart fluttering.  Wife reports patient feeling sluggish all day.  Pt with some sob.

## 2013-05-14 NOTE — Discharge Instructions (Signed)
Followup with your primary Dr. if your symptoms persist.  Return to the ER if your symptoms worsen or you develop other new or concerning symptoms.   Palpitations  A palpitation is the feeling that your heartbeat is irregular or is faster than normal. It may feel like your heart is fluttering or skipping a beat. Palpitations are usually not a serious problem. However, in some cases, you may need further medical evaluation. CAUSES  Palpitations can be caused by:  Smoking.  Caffeine or other stimulants, such as diet pills or energy drinks.  Alcohol.  Stress and anxiety.  Strenuous physical activity.  Fatigue.  Certain medicines.  Heart disease, especially if you have a history of arrhythmias. This includes atrial fibrillation, atrial flutter, or supraventricular tachycardia.  An improperly working pacemaker or defibrillator. DIAGNOSIS  To find the cause of your palpitations, your caregiver will take your history and perform a physical exam. Tests may also be done, including:  Electrocardiography (ECG). This test records the heart's electrical activity.  Cardiac monitoring. This allows your caregiver to monitor your heart rate and rhythm in real time.  Holter monitor. This is a portable device that records your heartbeat and can help diagnose heart arrhythmias. It allows your caregiver to track your heart activity for several days, if needed.  Stress tests by exercise or by giving medicine that makes the heart beat faster. TREATMENT  Treatment of palpitations depends on the cause of your symptoms and can vary greatly. Most cases of palpitations do not require any treatment other than time, relaxation, and monitoring your symptoms. Other causes, such as atrial fibrillation, atrial flutter, or supraventricular tachycardia, usually require further treatment. HOME CARE INSTRUCTIONS   Avoid:  Caffeinated coffee, tea, soft drinks, diet pills, and energy  drinks.  Chocolate.  Alcohol.  Stop smoking if you smoke.  Reduce your stress and anxiety. Things that can help you relax include:  A method that measures bodily functions so you can learn to control them (biofeedback).  Yoga.  Meditation.  Physical activity such as swimming, jogging, or walking.  Get plenty of rest and sleep. SEEK MEDICAL CARE IF:   You continue to have a fast or irregular heartbeat beyond 24 hours.  Your palpitations occur more often. SEEK IMMEDIATE MEDICAL CARE IF:  You develop chest pain or shortness of breath.  You have a severe headache.  You feel dizzy, or you faint. MAKE SURE YOU:  Understand these instructions.  Will watch your condition.  Will get help right away if you are not doing well or get worse. Document Released: 04/09/2000 Document Revised: 08/07/2012 Document Reviewed: 06/11/2011 [facility] Patient Information 2014 [place].

## 2013-05-14 NOTE — ED Provider Notes (Signed)
CSN: [id number]     Arrival date & time 05/14/13  1843 History   First MD Initiated Contact with Patient 05/14/13 2124     Chief Complaint  Patient presents with  . Chest Pain  . Headache  . Back Pain   (Consider location/radiation/quality/duration/timing/severity/associated sxs/prior Treatment) HPI Comments: Patient is a 43 year old male with history of hypertension, high cholesterol, obesity. He presents today with complaints of pain in his upper back and head and fluttering in his chest that has been going on for the past 2 days. He hit his head in May and is at some sort of postconcussive syndrome since that time. His headache feels similar to that however seemed to be somewhat worse today. He denies any fevers, chills, or stiff neck. He does report feeling a flutter in his chest which has lasted for one to 2 seconds and has occurred multiple times throughout the day. He denies any chest pain or shortness of breath. There are no aggravating or alleviating factors. He has no prior cardiac history.  The history is provided by the patient.    Past Medical History  Diagnosis Date  . Hyperlipidemia   . Hypertension   . Obesity   . OSA (obstructive sleep apnea)   . Gout   . Legal blindness   . Knee pain, bilateral   . Tobacco abuse   . Carpal tunnel syndrome   . Condylomata acuminata in male    Past Surgical History  Procedure Laterality Date  . 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    Family History  Problem Relation Age of Onset  . Diabetes Mother    History  Substance Use Topics  . Smoking status: Current Every Day Smoker -- 0.50 packs/day    Types: Cigarettes  . Smokeless tobacco: Never Used     Comment: decreased smoking  . Alcohol Use: No    Review of Systems  All other systems reviewed and are negative.    Allergies  Other  Home Medications   Current Outpatient Rx  Name  Route  Sig   Dispense  Refill  . amLODipine (NORVASC) 5 MG tablet   Oral   Take 1 tablet (5 mg total) by mouth daily.   90 tablet   3   . esomeprazole (NEXIUM) 40 MG capsule   Oral   Take 1 capsule (40 mg total) by mouth daily before breakfast.   30 capsule   3   . ibuprofen (ADVIL,MOTRIN) 200 MG tablet   Oral   Take 200 mg by mouth every 6 (six) hours as needed for pain.         Marland Kitchen levETIRAcetam (KEPPRA) 500 MG tablet   Oral   Take 1 tablet (500 mg total) by mouth 2 (two) times daily.   28 tablet   0   . metoprolol (LOPRESSOR) 100 MG tablet   Oral   Take 1 tablet (100 mg total) by mouth 2 (two) times daily.   60 tablet   11   . topiramate (TOPAMAX) 200 MG tablet   Oral   Take 200 mg by mouth daily.         Marland Kitchen torsemide (DEMADEX) 20 MG tablet   Oral   Take 1 tablet (20 mg total) by mouth daily as needed (swelling).   30 tablet   3    BP 148/94  Pulse 78  Temp(Src) 98.1 F (  36.7 C) (Oral)  Resp 15  Wt 350 lb (158.759 kg)  SpO2 98% Physical Exam  Nursing note and vitals reviewed. Constitutional: He is oriented to person, place, and time. He appears well-developed and well-nourished. No distress.  HENT:  Head: Normocephalic.  Mouth/Throat: Oropharynx is clear and moist.  Eyes: EOM are normal.  The right pupil is equal, however the left pupil does not react. 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  Neck: Normal range of motion. Neck supple.  Cardiovascular: Normal rate, regular rhythm and normal heart sounds.   No murmur heard. Pulmonary/Chest: Effort normal and breath sounds normal. No respiratory distress. He has no wheezes.  Abdominal: Soft. Bowel sounds are normal. He exhibits no distension. There is no tenderness.  Musculoskeletal: Normal range of motion. He exhibits no edema.  Neurological: He is alert and oriented to person, place, and time. No cranial nerve deficit. He exhibits normal muscle tone. Coordination normal.   Skin: Skin is warm and dry. He is not diaphoretic.    ED Course  Procedures (including critical care time) Labs Review Labs Reviewed  BASIC METABOLIC PANEL - Abnormal; Notable for the following:    Glucose, Bld 109 (*)    Creatinine, Ser 1.49 (*)    GFR calc non Af Amer 57 (*)    GFR calc Af Amer 66 (*)    All other components within normal limits  CBC  POCT I-STAT TROPONIN I   Imaging Review Dg Chest 2 View  05/14/2013   CLINICAL DATA:  Headache and chest pain.  EXAM: CHEST  2 VIEW  COMPARISON:  Single view of the chest 03/23/2007.  FINDINGS: Heart size is normal. Lungs are clear. No pneumothorax or pleural effusion. No focal bony abnormality.  IMPRESSION: No acute disease.   Electronically Signed   By: Inge Rise M.D.   On: 05/14/2013 20:22    EKG Interpretation    Date/Time:  Monday May 14 2013 18:47:50 EST Ventricular Rate:  80 PR Interval:  178 QRS Duration: 86 QT Interval:  378 QTC Calculation: 435 R Axis:   12 Text Interpretation:  Normal sinus rhythm Normal ECG When compared with ECG of 07/09/2013, No significant change was found Confirmed by [facility]  MD, DAVID ([id number]) on 05/14/2013 6:54:17 PM            MDM  No diagnosis found. Patient presents here with complaints of headache and upper back pain there have been going on since earlier this morning. He also a flickering in his chest. His neurologic exam is nonfocal and labs and chest x-ray are unremarkable. His EKG shows no abnormality. He was observed on the monitor and had no ectopy. He was given a migraine cocktail as he states that this has given him some relief in the past. He appears to be feeling better. At this point I feel as though he is stable for discharge.    Veryl Speak, MD 05/14/13 2300

## 2013-05-16 ENCOUNTER — Emergency Department (INDEPENDENT_AMBULATORY_CARE_PROVIDER_SITE_OTHER): Payer: Medicare Other

## 2013-05-16 ENCOUNTER — Emergency Department (INDEPENDENT_AMBULATORY_CARE_PROVIDER_SITE_OTHER)
Admission: EM | Admit: 2013-05-16 | Discharge: 2013-05-16 | Disposition: A | Discharge status: Home / Self Care | Payer: Medicare Other | Source: Home / Self Care | Attending: Emergency Medicine | Admitting: Emergency Medicine

## 2013-05-16 ENCOUNTER — Encounter (HOSPITAL_COMMUNITY): Payer: Self-pay | Admitting: Emergency Medicine

## 2013-05-16 DIAGNOSIS — J208 Acute bronchitis due to other specified organisms: Secondary | ICD-10-CM

## 2013-05-16 DIAGNOSIS — J209 Acute bronchitis, unspecified: Secondary | ICD-10-CM | POA: Diagnosis not present

## 2013-05-16 DIAGNOSIS — R079 Chest pain, unspecified: Secondary | ICD-10-CM | POA: Diagnosis not present

## 2013-05-16 MED ORDER — AZITHROMYCIN 250 MG PO TABS: ORAL_TABLET | ORAL | Status: DC

## 2013-05-16 MED ORDER — OXYCODONE-ACETAMINOPHEN 5-325 MG PO TABS: ORAL_TABLET | ORAL | Status: DC

## 2013-05-16 MED ORDER — HYDROCODONE-ACETAMINOPHEN 5-325 MG PO TABS
ORAL_TABLET | ORAL | Status: AC
  Filled 2013-05-16: qty 2

## 2013-05-16 MED ORDER — HYDROCODONE-ACETAMINOPHEN 5-325 MG PO TABS
2.0000 | ORAL_TABLET | Freq: Once | ORAL | Status: AC
  Administered 2013-05-16: 2 via ORAL

## 2013-05-16 MED ORDER — HYDROCOD POLST-CHLORPHEN POLST 10-8 MG/5ML PO LQCR: 5.0000 mL | Freq: Two times a day (BID) | ORAL | Status: DC | PRN

## 2013-05-16 MED ORDER — ALBUTEROL SULFATE HFA 108 (90 BASE) MCG/ACT IN AERS: 2.0000 | INHALATION_SPRAY | Freq: Four times a day (QID) | RESPIRATORY_TRACT | Status: DC | PRN

## 2013-05-16 MED ORDER — PREDNISONE 20 MG PO TABS: 20.0000 mg | ORAL_TABLET | Freq: Two times a day (BID) | ORAL | Status: DC

## 2013-05-16 NOTE — ED Provider Notes (Signed)
Chief Complaint   Chief Complaint  Patient presents with  . Cough    History of Present Illness   Mike Davenport is a 43 year old male who comes in today because of cough and chest pain. This began this morning. He's been bringing up yellow to green sputum, wheezing, shortness of breath. The chest pain is localized to along both costal margins. The patient states it hurts to cough it hurts to take a deep breath. He had some sweating, headache, chills, nasal congestion, rhinorrhea, and sore throat.  The patient's history goes back to a fall at work on 08/24/2012. He sustained a concussion. This was a workers comp injury. He's been followed up by multiple neurologists and neurosurgeons. Ever since then he's had intermittent headaches, lower back pain, and stuttering with his speech. 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 The neurologist are not sure what is causing the stuttering and continued headaches. He was in the emergency room on Monday because of a headache and heart palpitations. He thinks this is where he caught his current respiratory infection. He is on topiramate for the migraine headache prevention. In the emergency room he had a chest x-ray and EKG both of which were normal. He was given a migraine cocktail and this did help, and he was sent home on Norco. He's also had intermittent sensation of fluttering in the chest. He's not sure whether this is his heart or something else. This comes and goes and is worse with stress. He's had some dizzy spells but denies any syncope or presyncope.  Review of Systems   Other than as noted above, the patient denies any of the following symptoms: Systemic:  No fevers, chills, sweats, or myalgias. Eye:  No redness or discharge. ENT:  No ear pain, headache, nasal congestion, drainage, sinus pressure, or sore throat. Neck:  No neck pain, stiffness, or swollen glands. Lungs:  No cough, sputum production, hemoptysis, wheezing, chest  tightness, shortness of breath or chest pain. GI:  No abdominal pain, nausea, vomiting or diarrhea.  [place]   Past medical history, family history, social history, meds, and allergies were reviewed. Current meds include Nexium, Topamax, metoprolol, amlodipine, and furosemide. He has high blood pressure.  Physical exam   Vital signs:  BP 150/107  Pulse 85  Temp(Src) 98.3 F (36.8 C) (Oral)  Resp 18  SpO2 95% General:  Alert and oriented.  In no distress.  Skin warm and dry. Eye:  No conjunctival injection or drainage. Lids were normal. ENT:  TMs and canals were normal, without erythema or inflammation.  Nasal mucosa was clear and uncongested, without drainage.  Mucous membranes were moist.  Pharynx was clear with no exudate or drainage.  There were no oral ulcerations or lesions. Neck:  Supple, no adenopathy, tenderness or mass. Lungs:  No respiratory distress.  Lungs were clear to auscultation, without wheezes, rales or rhonchi.  Breath sounds were clear and equal bilaterally.  Heart:  Regular rhythm, without gallops, murmers or rubs. Skin:  Clear, warm, and dry, without rash or lesions.   Radiology   Dg Chest 2 View  05/16/2013   CLINICAL DATA:  Cough and chest pain for 1 day.  Headache.  EXAM: CHEST  2 VIEW  COMPARISON:  DG CHEST 2 VIEW dated 05/14/2013  FINDINGS: Midline trachea. Normal heart size and mediastinal contours. No pleural effusion or pneumothorax. Clear lungs.  IMPRESSION: No acute cardiopulmonary disease.   Electronically Signed   By: Adria Devon.D.  On: 05/16/2013 18:27   EKG Results    Date: 05/16/2013  Rate: 71  Rhythm: normal sinus rhythm  QRS Axis: normal  Intervals: normal  ST/T Wave abnormalities: normal  Conduction Disutrbances:none  Narrative Interpretation: Normal sinus rhythm, normal EKG.  Old EKG Reviewed: none available  Assessment     The encounter diagnosis was Viral bronchitis.  He also has numerous other issues including heart  palpitations, chronic headaches, chronic post concussion syndrome, stuttering speech, and chronic lower back pain. He'll need to followup with his neurologist and neurosurgeons. Also suggested following up with a cardiologist for the palpitations.  Plan    1.  Meds:  The following meds were prescribed:   Discharge Medication List as of 05/16/2013  6:38 PM    START taking these medications   Details  albuterol (PROVENTIL HFA;VENTOLIN HFA) 108 (90 BASE) MCG/ACT inhaler Inhale 2 puffs into the lungs every 6 (six) hours as needed for wheezing or shortness of breath., Starting 05/16/2013, Until Discontinued, Normal    azithromycin (ZITHROMAX Z-PAK) 250 MG tablet Take as directed., Normal    chlorpheniramine-HYDROcodone (TUSSIONEX) 10-8 MG/5ML LQCR Take 5 mLs by mouth every 12 (twelve) hours as needed for cough., Starting 05/16/2013, Until Discontinued, Normal    oxyCODONE-acetaminophen (PERCOCET) 5-325 MG per tablet 1 to 2 tablets every 6 hours as needed for pain., Print    predniSONE (DELTASONE) 20 MG tablet Take 1 tablet (20 mg total) by mouth 2 (two) times daily., Starting 05/16/2013, Until Discontinued, Normal        2.  Patient Education/Counseling:  The patient was given appropriate handouts, self care instructions, and instructed in symptomatic relief.  Instructed to get extra fluids, rest, and use a cool mist vaporizer.   3.  Follow up:  The patient was told to follow up here if no better in 3 to 4 days, or sooner if becoming worse in any way, and given some red flag symptoms such as increasing fever, difficulty breathing, chest pain, or persistent vomiting which would prompt immediate return.  Follow up with Villanueva for the palpitations.      Harden Mo, MD 05/16/13 2036

## 2013-05-16 NOTE — Discharge Instructions (Signed)
Bronchitis Bronchitis is inflammation of the airways that extend from the windpipe into the lungs (bronchi). The inflammation often causes mucus to develop, which leads to a cough. If the inflammation becomes severe, it may cause shortness of breath. CAUSES  Bronchitis may be caused by:   Viral infections.   Bacteria.   Cigarette smoke.   Allergens, pollutants, and other irritants.  SIGNS AND SYMPTOMS  The most common symptom of bronchitis is a frequent cough that produces mucus. Other symptoms include:  Fever.   Body aches.   Chest congestion.   Chills.   Shortness of breath.   Sore throat.  DIAGNOSIS  Bronchitis is usually diagnosed through a medical history and physical exam. Tests, such as chest X-rays, are sometimes done to rule out other conditions.  TREATMENT  You may need to avoid contact with whatever caused the problem (smoking, for example). Medicines are sometimes needed. These may include:  Antibiotics. These may be prescribed if the condition is caused by bacteria.  Cough suppressants. These may be prescribed for relief of cough symptoms.   Inhaled medicines. These may be prescribed to help open your airways and make it easier for you to breathe.   Steroid medicines. These may be prescribed for those with recurrent (chronic) bronchitis. HOME CARE INSTRUCTIONS  Get plenty of rest.   Drink enough fluids to keep your urine clear or pale yellow (unless you have a medical condition that requires fluid restriction). Increasing fluids may help thin your secretions and will prevent dehydration.   Only take over-the-counter or prescription medicines as directed by your health care provider.  Only take antibiotics as directed. Make sure you finish them even if you start to feel better.  Avoid secondhand smoke, irritating chemicals, and strong fumes. These will make bronchitis worse. If you are a smoker, quit smoking. Consider using nicotine gum or  skin patches to help control withdrawal symptoms. Quitting smoking will help your lungs heal faster.   Put a cool-mist humidifier in your bedroom at night to moisten the air. This may help loosen mucus. Change the water in the humidifier daily. You can also run the hot water in your shower and sit in the bathroom with the door closed for 5 10 minutes.   Follow up with your health care provider as directed.   Wash your hands frequently to avoid catching bronchitis again or spreading an infection to others.  SEEK MEDICAL CARE IF: Your symptoms do not improve after 1 week of treatment.  SEEK IMMEDIATE MEDICAL CARE IF:  Your fever increases.  You have chills.   You have chest pain.   You have worsening shortness of breath.   You have bloody sputum.  You faint.  You have lightheadedness.  You have a severe headache.   You vomit repeatedly. MAKE SURE YOU:   Understand these instructions.  Will watch your condition.  Will get help right away if you are not doing well or get worse. Document Released: 04/12/2005 Document Revised: 01/31/2013 Document Reviewed: 12/05/2012 [facility] Patient Information 2014 [place]. How to Use an Inhaler Proper inhaler technique is very important. Good technique ensures that the medicine reaches the lungs. Poor technique results in depositing the medicine on the tongue and back of the throat rather than in the airways. If you do not use the inhaler with good technique, the medicine will not help you. STEPS TO FOLLOW IF USING AN INHALER WITHOUT AN EXTENSION TUBE 1. Remove the cap from the inhaler. 2. If you  are using the inhaler for the first time, you will need to prime it. Shake the inhaler for 5 seconds and release four puffs into the air, away from your face. Ask your health care provider or pharmacist if you have questions about priming your inhaler. 3. Shake the inhaler for 5 seconds before each breath in (inhalation). 4. Position  the inhaler so that the top of the canister faces up. 5. Put your index finger on the top of the medicine canister. Your thumb supports the bottom of the inhaler. 6. Open your mouth. 7. Either place the inhaler between your teeth and place your lips tightly around the mouthpiece, or hold the inhaler 1 2 inches away from your open mouth. If you are unsure of which technique to use, ask your health care provider. 8. Breathe out (exhale) normally and as completely as possible. 9. Press the canister down with your index finger to release the medicine. 10. At the same time as the canister is pressed, inhale deeply and slowly until your lungs are completely filled. This should take 4 6 seconds. Keep your tongue down. 11. Hold the medicine in your lungs for 5 10 seconds (10 seconds is best). This helps the medicine get into the small airways of your lungs. 12. Breathe out slowly, through pursed lips. Whistling is an example of pursed lips. 13. Wait at least 15 30 seconds between puffs. Continue with the above steps until you have taken the number of puffs your health care provider has ordered. Do not use the inhaler more than your health care provider tells you. 14. Replace the cap on the inhaler. 15. Follow the directions from your health care provider or the inhaler insert for cleaning the inhaler. STEPS TO FOLLOW IF USING AN INHALER WITH AN EXTENSION (SPACER) 1. Remove the cap from the inhaler. 2. If you are using the inhaler for the first time, you will need to prime it. Shake the inhaler for 5 seconds and release four puffs into the air, away from your face. Ask your health care provider or pharmacist if you have questions about priming your inhaler. 3. Shake the inhaler for 5 seconds before each breath in (inhalation). 4. Place the open end of the spacer onto the mouthpiece of the inhaler. 5. Position the inhaler so that the top of the canister faces up and the spacer mouthpiece faces you. 6. Put  your index finger on the top of the medicine canister. Your thumb supports the bottom of the inhaler and the spacer. 7. Breathe out (exhale) normally and as completely as possible. 8. Immediately after exhaling, place the spacer between your teeth and into your mouth. Close your lips tightly around the spacer. 9. Press the canister down with your index finger to release the medicine. 10. At the same time as the canister is pressed, inhale deeply and slowly until your lungs are completely filled. This should take 4 6 seconds. Keep your tongue down and out of the way. 11. Hold the medicine in your lungs for 5 10 seconds (10 seconds is best). This helps the medicine get into the small airways of your lungs. Exhale. 12. Repeat inhaling deeply through the spacer mouthpiece. Again hold that breath for up to 10 seconds (10 seconds is best). Exhale slowly. If it is difficult to take this second deep breath through the spacer, breathe normally several times through the spacer. Remove the spacer from your mouth. 13. Wait at least 15 30 seconds between puffs. Continue with  the above steps until you have taken the number of puffs your health care provider has ordered. Do not use the inhaler more than your health care provider tells you. 14. Remove the spacer from the inhaler, and place the cap on the inhaler. 15. Follow the directions from your health care provider or the inhaler insert for cleaning the inhaler and spacer. If you are using different kinds of inhalers, use your quick relief medicine to open the airways 10 15 minutes before using a steroid if instructed to do so by your health care provider. If you are unsure which inhalers to use and the order of using them, ask your health care provider, nurse, or respiratory therapist. If you are using a steroid inhaler, always rinse your mouth with water after your last puff, then gargle and spit out the water. Do not swallow the water. AVOID:  Inhaling before  or after starting the spray of medicine. It takes practice to coordinate your breathing with triggering the spray.  Inhaling through the nose (rather than the mouth) when triggering the spray. HOW TO DETERMINE IF YOUR INHALER IS FULL OR NEARLY EMPTY You cannot know when an inhaler is empty by shaking it. A few inhalers are now being made with dose counters. Ask your health care provider for a prescription that has a dose counter if you feel you need that extra help. If your inhaler does not have a counter, ask your health care provider to help you determine the date you need to refill your inhaler. Write the refill date on a calendar or your inhaler canister. Refill your inhaler 7 10 days before it runs out. Be sure to keep an adequate supply of medicine. This includes making sure it is not expired, and that you have a spare inhaler.  SEEK MEDICAL CARE IF:   Your symptoms are only partially relieved with your inhaler.  You are having trouble using your inhaler.  You have some increase in phlegm. SEEK IMMEDIATE MEDICAL CARE IF:   You feel little or no relief with your inhalers. You are still wheezing and are feeling shortness of breath or tightness in your chest or both.  You have dizziness, headaches, or a fast heart rate.  You have chills, fever, or night sweats.  You have a noticeable increase in phlegm production, or there is blood in the phlegm. MAKE SURE YOU:   Understand these instructions.  Will watch your condition.  Will get help right away if you are not doing well or get worse. Document Released: 04/09/2000 Document Revised: 01/31/2013 Document Reviewed: 11/09/2012 [facility] Patient Information 2014 [place], [place].

## 2013-05-16 NOTE — ED Notes (Signed)
Was asked to eval this pt on arrival; pt c/o cough, chest sore to palpation, barking cough

## 2013-05-23 ENCOUNTER — Ambulatory Visit (INDEPENDENT_AMBULATORY_CARE_PROVIDER_SITE_OTHER): Payer: Medicare Other | Admitting: Emergency Medicine

## 2013-05-23 VITALS — BP 154/115 | HR 74 | Temp 97.8°F | Ht 70.0 in | Wt 381.0 lb

## 2013-05-23 DIAGNOSIS — R519 Headache, unspecified: Secondary | ICD-10-CM | POA: Insufficient documentation

## 2013-05-23 DIAGNOSIS — G4730 Sleep apnea, unspecified: Secondary | ICD-10-CM | POA: Diagnosis not present

## 2013-05-23 DIAGNOSIS — R51 Headache: Secondary | ICD-10-CM | POA: Insufficient documentation

## 2013-05-23 DIAGNOSIS — G43909 Migraine, unspecified, not intractable, without status migrainosus: Secondary | ICD-10-CM

## 2013-05-23 DIAGNOSIS — I10 Essential (primary) hypertension: Secondary | ICD-10-CM

## 2013-05-23 MED ORDER — DIPHENHYDRAMINE HCL 50 MG/ML IJ SOLN
25.0000 mg | Freq: Once | INTRAMUSCULAR | Status: AC
Start: 2013-05-23 — End: 2013-05-23
  Administered 2013-05-23: 25 mg via INTRAMUSCULAR

## 2013-05-23 MED ORDER — AMLODIPINE BESYLATE 10 MG PO TABS: 10.0000 mg | ORAL_TABLET | Freq: Every day | ORAL | Status: DC

## 2013-05-23 MED ORDER — PROMETHAZINE HCL 25 MG/ML IJ SOLN
12.5000 mg | Freq: Once | INTRAMUSCULAR | Status: AC
  Administered 2013-05-23: 12.5 mg via INTRAMUSCULAR

## 2013-05-23 NOTE — Patient Instructions (Signed)
It was nice to see you!  The headache might have a migraine component. I gave you some medicine today to see if we can break the cycle - it will make you sleepy.  You should get a call from the sleep center to set up a sleep study for sleep apnea and see if you need the machine back.  Please increase your amlodipine to 2 tablets daily.  When you pick up a new prescription, they will be 10mg  tablets and you go back to taking 1 a day.  Follow up as scheduled on February 9th.

## 2013-05-23 NOTE — Progress Notes (Signed)
Subjective:    Patient ID: Mike Davenport, male    DOB: 1971/04/06, 43 y.o.   MRN: [id number]  HPI Mike Davenport is here for a SDA for high blood pressure.  1. Hypertension: Reports that he was seen at his back doctor today.  They want to do some injections, but need his BP to be controlled first.  Patient is able to list both his BP medications.  No missed doses reported.  States that he has gained a lot of weight in the last 9 months and not sleeping well.  BP was better after starting the amlodipine 5mg  daily, and has only gone up in the last month or so.  No chest pain.  Does have some intermittent palpitations with his current URI.  Does have chronic leg edema that is well controlled with torsemide.  2. Headache: Reports daily posterior headache.  Described as tightness, pounding, and pressure.  Sometimes associated with photophobia and phonophobia.  Sometimes with some nausea.  Headache has been present to some degree since his concussion in 08/2012.  States the only thing that has really helped is when he gets a "cocktail" in the ED.  3. Sleep: Reports poor sleep.  States he used to be on a CPAP machine, but then lost a lot of weight and didn't need it any more.  Current Outpatient Prescriptions on File Prior to Visit  Medication Sig Dispense Refill  . albuterol (PROVENTIL HFA;VENTOLIN HFA) 108 (90 BASE) MCG/ACT inhaler Inhale 2 puffs into the lungs every 6 (six) hours as needed for wheezing or shortness of breath.  1 Inhaler  0  . azithromycin (ZITHROMAX Z-PAK) 250 MG tablet Take as directed.  6 tablet  0  . chlorpheniramine-HYDROcodone (TUSSIONEX) 10-8 MG/5ML LQCR Take 5 mLs by mouth every 12 (twelve) hours as needed for cough.  140 mL  0  . esomeprazole (NEXIUM) 40 MG capsule Take 1 capsule (40 mg total) by mouth daily before breakfast.  30 capsule  3  . HYDROcodone-acetaminophen (NORCO) 5-325 MG per tablet Take 2 tablets by mouth every 4 (four) hours as needed.  12 tablet  0    . ibuprofen (ADVIL,MOTRIN) 200 MG tablet Take 200 mg by mouth every 6 (six) hours as needed for pain.      Marland Kitchen levETIRAcetam (KEPPRA) 500 MG tablet Take 1 tablet (500 mg total) by mouth 2 (two) times daily.  28 tablet  0  . metoprolol (LOPRESSOR) 100 MG tablet Take 1 tablet (100 mg total) by mouth 2 (two) times daily.  60 tablet  11  . oxyCODONE-acetaminophen (PERCOCET) 5-325 MG per tablet 1 to 2 tablets every 6 hours as needed for pain.  20 tablet  0  . predniSONE (DELTASONE) 20 MG tablet Take 1 tablet (20 mg total) by mouth 2 (two) times daily.  10 tablet  0  . topiramate (TOPAMAX) 200 MG tablet Take 200 mg by mouth daily.      Marland Kitchen torsemide (DEMADEX) 20 MG tablet Take 1 tablet (20 mg total) by mouth daily as needed (swelling).  30 tablet  3   No current facility-administered medications on file prior to visit.    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

## 2013-05-23 NOTE — Assessment & Plan Note (Signed)
H/o CPAP use.  Hasn't needed due to weight loss. Has regained a lot of weight in the last 9 months. Will order sleep study to see if needs CPAP again.

## 2013-05-23 NOTE — Assessment & Plan Note (Signed)
Elevated. Compliant with medications. Will increase amlodipine to 10mg  daily. Follow up as scheduled 2/9 for recheck and BMP.

## 2013-05-23 NOTE — Assessment & Plan Note (Signed)
Some migrainous components.  Also some tension components. Will treat sleep apnea and HTN. Phenergan 12.5mg  IM and benadryl 25mg  IM today in clinic. F/u as scheduled 2/9.

## 2013-05-24 ENCOUNTER — Ambulatory Visit: Payer: Self-pay

## 2013-05-30 ENCOUNTER — Other Ambulatory Visit: Payer: Self-pay | Admitting: Emergency Medicine

## 2013-05-30 MED ORDER — ESOMEPRAZOLE MAGNESIUM 40 MG PO CPDR: 40.0000 mg | DELAYED_RELEASE_CAPSULE | Freq: Every day | ORAL | Status: DC

## 2013-05-31 ENCOUNTER — Other Ambulatory Visit: Payer: Self-pay | Admitting: Emergency Medicine

## 2013-05-31 MED ORDER — OMEPRAZOLE 40 MG PO CPDR: 40.0000 mg | DELAYED_RELEASE_CAPSULE | Freq: Every day | ORAL | Status: DC

## 2013-06-04 ENCOUNTER — Ambulatory Visit (INDEPENDENT_AMBULATORY_CARE_PROVIDER_SITE_OTHER): Payer: Medicare Other | Admitting: Emergency Medicine

## 2013-06-04 ENCOUNTER — Encounter: Payer: Self-pay | Admitting: Emergency Medicine

## 2013-06-04 VITALS — BP 130/89 | HR 89 | Ht 69.0 in | Wt 376.0 lb

## 2013-06-04 DIAGNOSIS — I10 Essential (primary) hypertension: Secondary | ICD-10-CM

## 2013-06-04 DIAGNOSIS — I5032 Chronic diastolic (congestive) heart failure: Secondary | ICD-10-CM

## 2013-06-04 DIAGNOSIS — G8929 Other chronic pain: Secondary | ICD-10-CM | POA: Diagnosis not present

## 2013-06-04 DIAGNOSIS — F3289 Other specified depressive episodes: Secondary | ICD-10-CM

## 2013-06-04 DIAGNOSIS — R51 Headache: Secondary | ICD-10-CM

## 2013-06-04 DIAGNOSIS — M545 Low back pain, unspecified: Secondary | ICD-10-CM

## 2013-06-04 DIAGNOSIS — F329 Major depressive disorder, single episode, unspecified: Secondary | ICD-10-CM

## 2013-06-04 DIAGNOSIS — F32A Depression, unspecified: Secondary | ICD-10-CM | POA: Insufficient documentation

## 2013-06-04 LAB — IBC PANEL
%SAT: 45 % (ref 20–55)
TIBC: 260 ug/dL (ref 215–435)
UIBC: 144 ug/dL (ref 125–400)

## 2013-06-04 LAB — IRON: Iron: 116 ug/dL (ref 42–165)

## 2013-06-04 MED ORDER — NORTRIPTYLINE HCL 25 MG PO CAPS: 25.0000 mg | ORAL_CAPSULE | Freq: Every day | ORAL | Status: DC

## 2013-06-04 NOTE — Assessment & Plan Note (Signed)
Likely MSK.  Has been followed by worker's comp physician. Will start nortriptyline to see if that will help.

## 2013-06-04 NOTE — Progress Notes (Signed)
   Subjective:    Patient ID: Mike Davenport, male    DOB: 1971-01-11, 43 y.o.   MRN: [id number]  HPI Mike Davenport is here for f/u.  Hypertension Doing well on current medications.  No chest pain or shortness of breath.  Back pain Reports chronic low back pain over the last 6 months or so.  Located in middle lower back.  Will radiation to the side and sometimes to the back of his thighs.  No radiation down the leg. No bowel or bladder incontinence.  Depression Patient reports depressed mood.  See depression screen for PHQ-9 results.  Denies active or passive SI, but will sometimes think something like "why am I here."  He continues to have daily headaches (states the shots last time did help for several hours) and stutter which are sequela of his concussion.  He is also upset about his weight.  He has no history of depression.  Current Outpatient Prescriptions on File Prior to Visit  Medication Sig Dispense Refill  . albuterol (PROVENTIL HFA;VENTOLIN HFA) 108 (90 BASE) MCG/ACT inhaler Inhale 2 puffs into the lungs every 6 (six) hours as needed for wheezing or shortness of breath.  1 Inhaler  0  . amLODipine (NORVASC) 10 MG tablet Take 1 tablet (10 mg total) by mouth daily.  90 tablet  3  . azithromycin (ZITHROMAX Z-PAK) 250 MG tablet Take as directed.  6 tablet  0  . chlorpheniramine-HYDROcodone (TUSSIONEX) 10-8 MG/5ML LQCR Take 5 mLs by mouth every 12 (twelve) hours as needed for cough.  140 mL  0  . HYDROcodone-acetaminophen (NORCO) 5-325 MG per tablet Take 2 tablets by mouth every 4 (four) hours as needed.  12 tablet  0  . ibuprofen (ADVIL,MOTRIN) 200 MG tablet Take 200 mg by mouth every 6 (six) hours as needed for pain.      Marland Kitchen levETIRAcetam (KEPPRA) 500 MG tablet Take 1 tablet (500 mg total) by mouth 2 (two) times daily.  28 tablet  0  . metoprolol (LOPRESSOR) 100 MG tablet Take 1 tablet (100 mg total) by mouth 2 (two) times daily.  60 tablet  11  . omeprazole (PRILOSEC) 40 MG  capsule Take 1 capsule (40 mg total) by mouth daily.  30 capsule  6  . oxyCODONE-acetaminophen (PERCOCET) 5-325 MG per tablet 1 to 2 tablets every 6 hours as needed for pain.  20 tablet  0  . predniSONE (DELTASONE) 20 MG tablet Take 1 tablet (20 mg total) by mouth 2 (two) times daily.  10 tablet  0  . topiramate (TOPAMAX) 200 MG tablet Take 200 mg by mouth daily.      Marland Kitchen torsemide (DEMADEX) 20 MG tablet Take 1 tablet (20 mg total) by mouth daily as needed (swelling).  30 tablet  3   No current facility-administered medications on file prior to visit.    I have reviewed and updated the following as appropriate: allergies and current medications SHx: non smoker   Review of Systems See HPI    Objective:   Physical Exam BP 130/89  Pulse 89  Ht 5\' 9"  (1.753 m)  Wt 376 lb (170.552 kg)  BMI 55.50 kg/m2 Gen: alert, cooperative, NAD HEENT: AT/[place], sclera white, MMM Neck: supple CV: RRR, no murmurs Pulm: CTAB, no wheezes or rales Psych: depressed; affect consistent     Assessment & Plan:

## 2013-06-04 NOTE — Assessment & Plan Note (Signed)
Prior diagnosis of post-concussive headache.  Does have some migrainous components. Already on topamax. Add nortriptyline 25mg  qHS. F/u in 2 weeks.

## 2013-06-04 NOTE — Assessment & Plan Note (Addendum)
PHQ-9 22 (3 for everything except 1 for SI). Denies active and passive SI today. Has has lots of changes in the last 6 months. Offered counseling, patient declines at this time. Start notriptyline to help with depression, sleep, back pain and headaches. F/u in 2 weeks.

## 2013-06-04 NOTE — Assessment & Plan Note (Signed)
Well controlled. Continue current medications  

## 2013-06-04 NOTE — Assessment & Plan Note (Signed)
Iron panel today 

## 2013-06-04 NOTE — Patient Instructions (Signed)
It was nice to see you!  We are starting a medicine that will help your mood, headaches, sleep and pain.  It is called Nortriptyline.  Take 1 pill at bedtime.  Follow up with me in 2 weeks.  We will likely increase the medication at that time.

## 2013-06-17 ENCOUNTER — Ambulatory Visit (HOSPITAL_BASED_OUTPATIENT_CLINIC_OR_DEPARTMENT_OTHER): Payer: Medicare Other | Attending: Family Medicine

## 2013-06-17 VITALS — Ht 69.0 in | Wt 370.0 lb

## 2013-06-17 DIAGNOSIS — G4733 Obstructive sleep apnea (adult) (pediatric): Secondary | ICD-10-CM | POA: Insufficient documentation

## 2013-06-17 DIAGNOSIS — G4734 Idiopathic sleep related nonobstructive alveolar hypoventilation: Secondary | ICD-10-CM | POA: Insufficient documentation

## 2013-06-17 DIAGNOSIS — Z9989 Dependence on other enabling machines and devices: Secondary | ICD-10-CM

## 2013-06-17 DIAGNOSIS — G4730 Sleep apnea, unspecified: Secondary | ICD-10-CM

## 2013-06-18 ENCOUNTER — Encounter: Payer: Self-pay | Admitting: Emergency Medicine

## 2013-06-18 ENCOUNTER — Ambulatory Visit (INDEPENDENT_AMBULATORY_CARE_PROVIDER_SITE_OTHER): Payer: Medicare Other | Admitting: Emergency Medicine

## 2013-06-18 VITALS — BP 148/101 | HR 74 | Temp 97.5°F | Ht 69.0 in | Wt 375.0 lb

## 2013-06-18 DIAGNOSIS — R51 Headache: Secondary | ICD-10-CM | POA: Diagnosis not present

## 2013-06-18 DIAGNOSIS — I10 Essential (primary) hypertension: Secondary | ICD-10-CM

## 2013-06-18 DIAGNOSIS — F329 Major depressive disorder, single episode, unspecified: Secondary | ICD-10-CM

## 2013-06-18 DIAGNOSIS — F32A Depression, unspecified: Secondary | ICD-10-CM

## 2013-06-18 DIAGNOSIS — F3289 Other specified depressive episodes: Secondary | ICD-10-CM | POA: Diagnosis not present

## 2013-06-18 MED ORDER — CITALOPRAM HYDROBROMIDE 20 MG PO TABS: 20.0000 mg | ORAL_TABLET | Freq: Every day | ORAL | Status: DC

## 2013-06-18 MED ORDER — METOCLOPRAMIDE HCL 10 MG PO TABS: 10.0000 mg | ORAL_TABLET | Freq: Three times a day (TID) | ORAL | Status: DC

## 2013-06-18 NOTE — Assessment & Plan Note (Signed)
Elevated today, but frustrated and upset. Will recheck at f/u in 2 weeks and adjust medication if needed.

## 2013-06-18 NOTE — Assessment & Plan Note (Signed)
PHQ-9 is actually improved from last time, but he reports feeling worse. Stop nortriptyline. Start celexa 20mg  daily. F/u in 2 weeks.

## 2013-06-18 NOTE — Patient Instructions (Signed)
It was nice to see you! We are going to keep working until you feel better.  Stop the nortriptyline. Start celexa 20mg  once a day. Start reglan 10mg  3 times a day for 1 week.  Follow up with me in 2 weeks.

## 2013-06-18 NOTE — Assessment & Plan Note (Signed)
Stopping nortriptyline. Will try reglan 10mg  TID x1 week to see if that will help.  There was an inpatient study of ergotamine and reglan that showed improvement. F/u in 2 weeks.

## 2013-06-18 NOTE — Progress Notes (Signed)
   Subjective:    Patient ID: Mike Davenport, male    DOB: April 07, 1971, 43 y.o.   MRN: [id number]  HPI Mike Davenport is here for f/u mood and headache.  Mood He and his partner both reports being more irritable on the nortriptyline.  He would like to stop it.  He remains very frustrated.  PHQ-9 documented in EPIC.  Denies any SI/HI.  Headache States the nortriptyline did help a little initially, but he is back to his typical headaches.  Current Outpatient Prescriptions on File Prior to Visit  Medication Sig Dispense Refill  . albuterol (PROVENTIL HFA;VENTOLIN HFA) 108 (90 BASE) MCG/ACT inhaler Inhale 2 puffs into the lungs every 6 (six) hours as needed for wheezing or shortness of breath.  1 Inhaler  0  . amLODipine (NORVASC) 10 MG tablet Take 1 tablet (10 mg total) by mouth daily.  90 tablet  3  . HYDROcodone-acetaminophen (NORCO) 5-325 MG per tablet Take 2 tablets by mouth every 4 (four) hours as needed.  12 tablet  0  . ibuprofen (ADVIL,MOTRIN) 200 MG tablet Take 200 mg by mouth every 6 (six) hours as needed for pain.      Marland Kitchen levETIRAcetam (KEPPRA) 500 MG tablet Take 1 tablet (500 mg total) by mouth 2 (two) times daily.  28 tablet  0  . metoprolol (LOPRESSOR) 100 MG tablet Take 1 tablet (100 mg total) by mouth 2 (two) times daily.  60 tablet  11  . omeprazole (PRILOSEC) 40 MG capsule Take 1 capsule (40 mg total) by mouth daily.  30 capsule  6  . oxyCODONE-acetaminophen (PERCOCET) 5-325 MG per tablet 1 to 2 tablets every 6 hours as needed for pain.  20 tablet  0  . predniSONE (DELTASONE) 20 MG tablet Take 1 tablet (20 mg total) by mouth 2 (two) times daily.  10 tablet  0  . topiramate (TOPAMAX) 200 MG tablet Take 200 mg by mouth daily.      Marland Kitchen torsemide (DEMADEX) 20 MG tablet Take 1 tablet (20 mg total) by mouth daily as needed (swelling).  30 tablet  3   No current facility-administered medications on file prior to visit.    I have reviewed and updated the following as  appropriate: allergies and current medications SHx: current smoker   Review of Systems See HPI    Objective:   Physical Exam BP 148/101  Pulse 74  Temp(Src) 97.5 F (36.4 C) (Oral)  Ht 5\' 9"  (1.753 m)  Wt 375 lb (170.099 kg)  BMI 55.35 kg/m2 Gen: alert, cooperative, NAD HEENT: AT/[place], sclera white, MMM, disconjugate gaze Neck: supple CV: RRR, no murmurs Pulm: CTAB, no wheezes or rales      Assessment & Plan:

## 2013-06-21 DIAGNOSIS — G4730 Sleep apnea, unspecified: Secondary | ICD-10-CM

## 2013-06-21 DIAGNOSIS — G4733 Obstructive sleep apnea (adult) (pediatric): Secondary | ICD-10-CM

## 2013-06-21 NOTE — Sleep Study (Signed)
   NAME: Mike Davenport DATE OF BIRTH:  July 02, 1970 MEDICAL RECORD NUMBER [id number]  [facility]  PHYSICIAN: Hanalei Glace D  DATE OF STUDY: 06/17/2013  SLEEP STUDY TYPE: Nocturnal Polysomnogram               REFERRING PHYSICIAN: Andrena Mews, MD  INDICATION FOR STUDY: Hypersomnia with sleep apnea  EPWORTH SLEEPINESS SCORE:   11/24 HEIGHT: 5\' 9"  (175.3 cm)  WEIGHT: 370 lb (167.831 kg)    Body mass index is 54.61 kg/(m^2).  NECK SIZE:   in.  MEDICATIONS: Charted for review  SLEEP ARCHITECTURE: Split study protocol. During the diagnostic phase total sleep time 131 minutes with sleep efficiency 60.6%. Stage I was 5%, stage II 95%, stage III and REM were absent. Sleep latency 49.5 minutes, awake after sleep onset 35.5 minutes, arousal index 1.4. Bedtime medication: None  RESPIRATORY DATA: Apnea hypopnea index (AHI) 23.8 per hour. 52 total events scored including 4 obstructive apneas and 48 hypopneas. All events were associated with supine sleep position. CPAP titration to 11 CWP, AHI 2.1 per hour. He wore a medium [employer] Simplus fullface mask with heated humidifier.  OXYGEN DATA: Moderate snoring before CPAP with oxygen desaturation to a nadir of 83% on room air. With CPAP control, snoring was prevented and the mean oxygen saturation held at 91.6% on room air.  CARDIAC DATA: Normal sinus rhythm  MOVEMENT/PARASOMNIA: No significant movement disturbance. Bathroom x1  IMPRESSION/ RECOMMENDATION:   1) Moderate obstructive sleep apnea/hypoxia syndrome, AHI 23.8 per hour with mainly supine events. Moderate snoring with oxygen desaturation to a nadir of 83% on room air. 2) Successful CPAP titration to 11 CWP, AHI 2.1 per hour. He wore a medium [employer] Simplus fullface mask with heated humidifier. Snoring was prevented and mean oxygen saturation of 91.6% on room air.   Signed Baird Lyons, M.D. Deneise Lever Diplomate, American Board of Sleep  Medicine  ELECTRONICALLY SIGNED ON:  06/21/2013, 11:53 AM [place] PH: [phone]   FX: [phone] [place]

## 2013-06-29 ENCOUNTER — Other Ambulatory Visit: Payer: Self-pay | Admitting: Emergency Medicine

## 2013-06-29 DIAGNOSIS — G4733 Obstructive sleep apnea (adult) (pediatric): Secondary | ICD-10-CM

## 2013-07-09 ENCOUNTER — Ambulatory Visit (INDEPENDENT_AMBULATORY_CARE_PROVIDER_SITE_OTHER): Payer: Medicare Other | Admitting: Emergency Medicine

## 2013-07-09 ENCOUNTER — Encounter: Payer: Self-pay | Admitting: Emergency Medicine

## 2013-07-09 VITALS — BP 149/102 | HR 72 | Temp 98.2°F | Ht 69.0 in | Wt 377.0 lb

## 2013-07-09 DIAGNOSIS — I10 Essential (primary) hypertension: Secondary | ICD-10-CM | POA: Diagnosis not present

## 2013-07-09 DIAGNOSIS — F3289 Other specified depressive episodes: Secondary | ICD-10-CM

## 2013-07-09 DIAGNOSIS — F329 Major depressive disorder, single episode, unspecified: Secondary | ICD-10-CM

## 2013-07-09 DIAGNOSIS — G4730 Sleep apnea, unspecified: Secondary | ICD-10-CM | POA: Diagnosis not present

## 2013-07-09 DIAGNOSIS — R51 Headache: Secondary | ICD-10-CM

## 2013-07-09 DIAGNOSIS — F32A Depression, unspecified: Secondary | ICD-10-CM

## 2013-07-09 MED ORDER — HYDROCHLOROTHIAZIDE 25 MG PO TABS: 25.0000 mg | ORAL_TABLET | Freq: Every day | ORAL | Status: DC

## 2013-07-09 MED ORDER — CITALOPRAM HYDROBROMIDE 40 MG PO TABS: 40.0000 mg | ORAL_TABLET | Freq: Every day | ORAL | Status: DC

## 2013-07-09 NOTE — Assessment & Plan Note (Addendum)
Course of reglan did not help. Failed multiple treatments. Head CT last year was normal. Neuro exam normal. Referral to the [facility] placed.

## 2013-07-09 NOTE — Progress Notes (Signed)
   Subjective:    Patient ID: Mike Davenport, male    DOB: 02-Sep-1970, 43 y.o.   MRN: [id number]  HPI Mike Davenport is here for f/u mood.  Mood He and his partner report improved mood on the celexa.  He is tolerating the medication well.  Headache Continues to have daily headaches.  Will get temporary relief with migraine cocktail.  All this started after his concussion last year.  He had been on topomax and nortriptyline without improvement.  He has also received some kind of injection which did not help.    Hypertension Compliant with all his medications.  He states that it was better after he got some back injections 2 weeks ago which temporarily improved his pain.  Current Outpatient Prescriptions on File Prior to Visit  Medication Sig Dispense Refill  . albuterol (PROVENTIL HFA;VENTOLIN HFA) 108 (90 BASE) MCG/ACT inhaler Inhale 2 puffs into the lungs every 6 (six) hours as needed for wheezing or shortness of breath.  1 Inhaler  0  . amLODipine (NORVASC) 10 MG tablet Take 1 tablet (10 mg total) by mouth daily.  90 tablet  3  . HYDROcodone-acetaminophen (NORCO) 5-325 MG per tablet Take 2 tablets by mouth every 4 (four) hours as needed.  12 tablet  0  . ibuprofen (ADVIL,MOTRIN) 200 MG tablet Take 200 mg by mouth every 6 (six) hours as needed for pain.      Marland Kitchen levETIRAcetam (KEPPRA) 500 MG tablet Take 1 tablet (500 mg total) by mouth 2 (two) times daily.  28 tablet  0  . metoprolol (LOPRESSOR) 100 MG tablet Take 1 tablet (100 mg total) by mouth 2 (two) times daily.  60 tablet  11  . omeprazole (PRILOSEC) 40 MG capsule Take 1 capsule (40 mg total) by mouth daily.  30 capsule  6  . oxyCODONE-acetaminophen (PERCOCET) 5-325 MG per tablet 1 to 2 tablets every 6 hours as needed for pain.  20 tablet  0  . predniSONE (DELTASONE) 20 MG tablet Take 1 tablet (20 mg total) by mouth 2 (two) times daily.  10 tablet  0  . topiramate (TOPAMAX) 200 MG tablet Take 200 mg by mouth daily.      Marland Kitchen  torsemide (DEMADEX) 20 MG tablet Take 1 tablet (20 mg total) by mouth daily as needed (swelling).  30 tablet  3   No current facility-administered medications on file prior to visit.    I have reviewed and updated the following as appropriate: allergies and current medications SHx: current smoker  Review of Systems See HPI    Objective:   Physical Exam BP 149/102  Pulse 72  Temp(Src) 98.2 F (36.8 C) (Oral)  Ht 5\' 9"  (1.753 m)  Wt 377 lb (171.006 kg)  BMI 55.65 kg/m2 Gen: alert, cooperative, NAD, appears more euthymic today     Assessment & Plan:

## 2013-07-09 NOTE — Assessment & Plan Note (Signed)
Remains uncontrolled. Will add HCTZ 25mg  daily. F/u in 1 month.

## 2013-07-09 NOTE — Patient Instructions (Signed)
It was nice to see you!  I'm glad the mood is getting better. I want you to start taking 40mg  of the Celexa.  I put in a referral to the [facility].  This can take several months to get into.  Start taking HCTZ, 1 pill daily for your blood pressure.  If you haven't heard from [facility] about your CPAP by Friday, please let our office know.  Follow up in 1 month.

## 2013-07-09 NOTE — Assessment & Plan Note (Signed)
Has not been contacted by [facility] about CPAP yet.

## 2013-07-09 NOTE — Assessment & Plan Note (Signed)
Improved on the celexa. Increase celexa to 40mg  daily. F/u in 1 month.

## 2013-07-24 ENCOUNTER — Telehealth: Payer: Self-pay | Admitting: Neurology

## 2013-07-24 ENCOUNTER — Ambulatory Visit (INDEPENDENT_AMBULATORY_CARE_PROVIDER_SITE_OTHER): Payer: Medicare Other | Admitting: Neurology

## 2013-07-24 ENCOUNTER — Encounter: Payer: Self-pay | Admitting: Neurology

## 2013-07-24 ENCOUNTER — Telehealth: Payer: Self-pay | Admitting: *Deleted

## 2013-07-24 VITALS — BP 134/96 | HR 77 | Ht 69.69 in | Wt 373.4 lb

## 2013-07-24 DIAGNOSIS — F329 Major depressive disorder, single episode, unspecified: Secondary | ICD-10-CM

## 2013-07-24 DIAGNOSIS — M62838 Other muscle spasm: Secondary | ICD-10-CM | POA: Diagnosis not present

## 2013-07-24 DIAGNOSIS — F32A Depression, unspecified: Secondary | ICD-10-CM

## 2013-07-24 DIAGNOSIS — F8081 Childhood onset fluency disorder: Secondary | ICD-10-CM

## 2013-07-24 DIAGNOSIS — R51 Headache: Secondary | ICD-10-CM | POA: Diagnosis not present

## 2013-07-24 DIAGNOSIS — F3289 Other specified depressive episodes: Secondary | ICD-10-CM | POA: Diagnosis not present

## 2013-07-24 MED ORDER — TIZANIDINE HCL 4 MG PO CAPS: 4.0000 mg | ORAL_CAPSULE | Freq: Three times a day (TID) | ORAL | Status: DC | PRN

## 2013-07-24 NOTE — Patient Instructions (Addendum)
1. Recommend switching from Celexa to Effexor for headache treatment 2. Recommend cognitive behavior therapy for stuttering, depression, pain syndrome 3. Start Tizanidine 4mg  three times a day as needed for muscle spasms 4. Follow-up in 3 months

## 2013-07-24 NOTE — Telephone Encounter (Signed)
NPI number given, pt had appt today at [facility] with Dr. Delice Lesch.  Derl Barrow, RN

## 2013-07-24 NOTE — Telephone Encounter (Signed)
[phone], Please call pharmacy back about med substitution / Sherri S.

## 2013-07-24 NOTE — Progress Notes (Signed)
NEUROLOGY CONSULTATION NOTE  Mike Davenport MRN: [id number] DOB: Aug 21, 1970    Referring provider: Dr. Maryruth Eve Primary care provider: Dr. Maryruth Eve  Reason for consult:  Chronic daily headaches, stuttering  Dear Dr Bridgett Larsson:  Thank you for your kind referral of Mike Davenport for consultation of the above symptoms. Although his history is well known to you, please allow me to reiterate it for the purpose of our medical record. The patient was accompanied to the clinic by his fiance who also provides collateral information. Records and images were personally reviewed where available.  HISTORY OF PRESENT ILLNESS: This is a 43 year old right-handed man with a history of hypertension who fell at work in May 2014.  History is largely provided by his fiance, as the patient had stuttering throughout the visit.  He slipped in the bathroom and hit the back of his head, with unknown duration of loss of consciousness.  He started having headaches, stuttering, short-term memory loss, decreased focus, neck and back pain, and staring spells.  He was diagnosed with post-concussive syndrome and seizures.  Per report, EEG was abnormal with right frontal sharp waves.  He was initially started on amitriptyline and Keppra.  Keppra appeared to reduce the frequency of staring spells.  Amitriptyline was ineffective, and he was tried on Lamictal, Topamax, Depakote, and nortriptyline.  His fiance reported anger outbursts with nortriptyline.  He saw a second neurologist, Dr. Bland Span, who increased Topamax and ordered MRI brain, report unavailable, per Dr. Juliet Rude note this showed nonspecific changes but no significant evidence of trauma.  Repeat EEG was normal.  He underwent neuropsych testing, report unavailable, per Dr. Juliet Rude note this indicated no clear evidence for ongoing concussion.  It was felt that at least some of his symptoms could be from malingering or conversion.  His recommendation was therapy at [facility] to focus on his depression/anxiety.    He continues to have daily headaches, with only a few minutes of relief.  Pain starts in the occipital region, radiating to the frontal region, throbbing, no associated nausea/vomiting/photo/phonophobia, no positional component.  Medications listed above and occipital nerve blocks/trigger point injections did not help.  He does not take any rescue medications.  He has had poor sleep, 2 hours a night, if any.  He has occasional neck pain.  He has chronic left eye vision loss and reports his right eye vision is blurred.  He has pain in the back of both legs, right greater than left.  Arms are unaffected.  His fiance describes the seizures as staring and unresponsiveness lasting up to 20 minutes that he is amnestic of.  He denies any prior warning symptoms.  Keppra has decreased the frequency of these, none in the past couple of months.  His fiance feels he is depressed.  He feels worthless, and has had increased anger, which has improved with Celexa started 3 weeks ago.  There is no family history of seizures or headaches.  He had a normal birth and early development, no history of febrile convulsions, CNS infections, prior head injuries, or neurosurgical procedures.  PAST MEDICAL HISTORY: Past Medical History  Diagnosis Date  . Hyperlipidemia   . Hypertension   . Obesity   . OSA (obstructive sleep apnea)   . Gout   . Legal blindness   . Knee pain, bilateral   . Tobacco abuse   . Carpal tunnel syndrome   . Condylomata acuminata in male   . Seizures  PAST SURGICAL HISTORY: Past Surgical History  Procedure Laterality Date  . 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     MEDICATIONS: Current Outpatient Prescriptions on File Prior to Visit  Medication Sig Dispense Refill  . albuterol (PROVENTIL HFA;VENTOLIN HFA) 108 (90 BASE) MCG/ACT inhaler Inhale 2 puffs into the  lungs every 6 (six) hours as needed for wheezing or shortness of breath.  1 Inhaler  0  . amLODipine (NORVASC) 10 MG tablet Take 1 tablet (10 mg total) by mouth daily.  90 tablet  3  . citalopram (CELEXA) 40 MG tablet Take 1 tablet (40 mg total) by mouth daily.  30 tablet  3  . hydrochlorothiazide (HYDRODIURIL) 25 MG tablet Take 1 tablet (25 mg total) by mouth daily.  90 tablet  3  . ibuprofen (ADVIL,MOTRIN) 200 MG tablet Take 200 mg by mouth every 6 (six) hours as needed for pain.      Marland Kitchen levETIRAcetam (KEPPRA) 500 MG tablet Take 1 tablet (500 mg total) by mouth 2 (two) times daily.  28 tablet  0  . metoprolol (LOPRESSOR) 100 MG tablet Take 1 tablet (100 mg total) by mouth 2 (two) times daily.  60 tablet  11  . omeprazole (PRILOSEC) 40 MG capsule Take 1 capsule (40 mg total) by mouth daily.  30 capsule  6  . topiramate (TOPAMAX) 200 MG tablet Take 200 mg by mouth daily.      Marland Kitchen torsemide (DEMADEX) 20 MG tablet Take 1 tablet (20 mg total) by mouth daily as needed (swelling).  30 tablet  3  . HYDROcodone-acetaminophen (NORCO) 5-325 MG per tablet Take 2 tablets by mouth every 4 (four) hours as needed.  12 tablet  0   No current facility-administered medications on file prior to visit.    ALLERGIES: Allergies  Allergen Reactions  . Other Anaphylaxis    [place] nuts    FAMILY HISTORY: Family History  Problem Relation Age of Onset  . High blood pressure Mother   . Cancer Maternal Grandmother     SOCIAL HISTORY: History   Social History  . Marital Status: Single    Spouse Name: N/A    Number of Children: N/A  . Years of Education: N/A   Occupational History  . shipping and recieving     employer: [employer]    Social History Main Topics  . Smoking status: Current Every Day Smoker -- 0.50 packs/day    Types: Cigarettes  . Smokeless tobacco: Never Used     Comment: decreased smoking  . Alcohol Use: No  . Drug Use: No  . Sexual Activity: Not on file   Other Topics Concern  . Not on  file   Social History Narrative   Pt currently lives with brother and sister in law     REVIEW OF SYSTEMS: Constitutional: No fevers, chills, or sweats, no generalized fatigue, change in appetite Eyes: as above Ear, nose and throat: No hearing loss, ear pain, nasal congestion, sore throat Cardiovascular: No chest pain, palpitations Respiratory:  No shortness of breath at rest or with exertion, wheezes GastrointestinaI: No nausea, vomiting, diarrhea, abdominal pain, fecal incontinence Genitourinary:  No dysuria, urinary retention or frequency Musculoskeletal:  + neck pain, back pain Integumentary: No rash, pruritus, skin lesions Neurological: as above Psychiatric: + depression, insomnia, anxiety Endocrine: No palpitations, fatigue, diaphoresis, mood swings, change in appetite, change in weight, increased thirst Hematologic/Lymphatic:  No anemia,  purpura, petechiae. Allergic/Immunologic: no itchy/runny eyes, nasal congestion, recent allergic reactions, rashes  PHYSICAL EXAM: Filed Vitals:   07/24/13 0749  BP: 134/96  Pulse: 77   General: No acute distress, patient has flat affect, poor eye contact, stuttering throughout the visit Head:  Normocephalic/atraumatic Neck: supple, no paraspinal tenderness, full range of motion Back: + paraspinal tenderness Heart: regular rate and rhythm Lungs: Clear to auscultation bilaterally. Vascular: No carotid bruits. Skin/Extremities: No rash, no edema Neurological Exam: Mental status: alert and oriented to person, place, and time, no dysarthria. Patient had stuttering throughout the visit.  Fund of knowledge is appropriate.  Recent and remote memory are intact.  Attention and concentration are normal.    Able to name objects and repeat phrases. Cranial nerves: CN I: not tested CN II: left pupil 82mm unreactive, right pupil 27mm reactive. Unable to visualize disc on left, sharp disc on right.  Visual fields intact on right eye CN III, IV, VI:   Chronic left eye esotropia, full range of motion on left eye, no nystagmus, no ptosis CN V: facial sensation intact CN VII: upper and lower face symmetric CN VIII: hearing intact CN IX, X: gag intact, uvula midline CN XI: sternocleidomastoid and trapezius muscles intact CN XII: tongue midline Bulk & Tone: normal, no fasciculations. Motor: 5/5 throughout with no pronator drift. Sensation: intact to light touch, cold, pin, vibration and joint position sense.  No extinction to double simultaneous stimulation.  Romberg test negative Deep Tendon Reflexes: +2 throughout, no ankle clonus Plantar responses: downgoing bilaterally Cerebellar: no incoordination on finger to nose Gait: wide-based, slow and cautious, unable to tandem walk due to back pain Tremor: none  IMPRESSION: This is a 43 year old right-handed man with a history of hypertension and postconcussive syndrome after a fall in May 2014.  Since then, he has had chronic daily headaches, memory loss, stuttering, and worsening depression.  He underwent neuropsych testing with report that concussion component is no longer a major factor.  We discussed chronic daily headaches, he has tried several medications, I believe there is an overlay of depression magnifying his symptoms.  There is evidence that Effexor helps with headache prophylaxis and hopefully help with his depression as well.  He is already taking Celexa and will discuss switching from Celexa to Effexor with his PCP.  The stuttering has significantly affected his daily living, he is unable to communicate effectively.  Brain MRI reported as unremarkable.  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  He was diagnosed with seizures with episodes of staring and unresponsiveness.  He is currently on Keppra for seizure prophylaxis.  With the constellation of symptomatology, I am concerned about psychogenic seizures.  I strongly recommendation  psychotherapy with cognitive behavioral therapy to further address and treat psychogenic symptoms and depression/anxiety.  This was discussed at length with the patient and his fiance, who expressed understanding. He had back pain in the office today and will try Zanaflex for muscle spasms. Side effects were discussed.  He will follow-up in 3 months.    Thank you for allowing me to participate in the care of this patient. Please do not hesitate to call for any questions or concerns.   Mike Davenport, M.D.

## 2013-07-26 NOTE — Telephone Encounter (Signed)
Returned call to pharmacy but they did not know who or why we were called

## 2013-07-28 ENCOUNTER — Emergency Department (HOSPITAL_COMMUNITY)
Admission: EM | Admit: 2013-07-28 | Discharge: 2013-07-28 | Disposition: A | Discharge status: Home / Self Care | Payer: Medicare Other | Attending: Emergency Medicine | Admitting: Emergency Medicine

## 2013-07-28 ENCOUNTER — Other Ambulatory Visit: Payer: Self-pay | Admitting: Emergency Medicine

## 2013-07-28 ENCOUNTER — Encounter (HOSPITAL_COMMUNITY): Payer: Self-pay | Admitting: Emergency Medicine

## 2013-07-28 DIAGNOSIS — E669 Obesity, unspecified: Secondary | ICD-10-CM | POA: Insufficient documentation

## 2013-07-28 DIAGNOSIS — M549 Dorsalgia, unspecified: Secondary | ICD-10-CM | POA: Insufficient documentation

## 2013-07-28 DIAGNOSIS — Z8619 Personal history of other infectious and parasitic diseases: Secondary | ICD-10-CM | POA: Insufficient documentation

## 2013-07-28 DIAGNOSIS — Z79899 Other long term (current) drug therapy: Secondary | ICD-10-CM | POA: Insufficient documentation

## 2013-07-28 DIAGNOSIS — G40909 Epilepsy, unspecified, not intractable, without status epilepticus: Secondary | ICD-10-CM | POA: Insufficient documentation

## 2013-07-28 DIAGNOSIS — R52 Pain, unspecified: Secondary | ICD-10-CM | POA: Diagnosis not present

## 2013-07-28 DIAGNOSIS — F17200 Nicotine dependence, unspecified, uncomplicated: Secondary | ICD-10-CM | POA: Diagnosis not present

## 2013-07-28 DIAGNOSIS — G8929 Other chronic pain: Secondary | ICD-10-CM

## 2013-07-28 DIAGNOSIS — R51 Headache: Secondary | ICD-10-CM | POA: Diagnosis not present

## 2013-07-28 DIAGNOSIS — I10 Essential (primary) hypertension: Secondary | ICD-10-CM | POA: Insufficient documentation

## 2013-07-28 DIAGNOSIS — G8921 Chronic pain due to trauma: Secondary | ICD-10-CM | POA: Diagnosis not present

## 2013-07-28 DIAGNOSIS — R519 Headache, unspecified: Secondary | ICD-10-CM

## 2013-07-28 DIAGNOSIS — H548 Legal blindness, as defined in USA: Secondary | ICD-10-CM | POA: Insufficient documentation

## 2013-07-28 DIAGNOSIS — M546 Pain in thoracic spine: Secondary | ICD-10-CM | POA: Diagnosis not present

## 2013-07-28 MED ORDER — PROCHLORPERAZINE EDISYLATE 5 MG/ML IJ SOLN
10.0000 mg | Freq: Once | INTRAMUSCULAR | Status: AC
  Administered 2013-07-28: 10 mg via INTRAMUSCULAR
  Filled 2013-07-28: qty 2

## 2013-07-28 MED ORDER — DIPHENHYDRAMINE HCL 25 MG PO CAPS
25.0000 mg | ORAL_CAPSULE | Freq: Once | ORAL | Status: AC
  Administered 2013-07-28: 25 mg via ORAL
  Filled 2013-07-28: qty 1

## 2013-07-28 MED ORDER — KETOROLAC TROMETHAMINE 60 MG/2ML IM SOLN
60.0000 mg | Freq: Once | INTRAMUSCULAR | Status: AC
  Administered 2013-07-28: 60 mg via INTRAMUSCULAR
  Filled 2013-07-28: qty 2

## 2013-07-28 NOTE — ED Notes (Signed)
PT WANTS TO KNOW WHEN HE CAN GO HOME.

## 2013-07-28 NOTE — ED Notes (Signed)
Pt comfortable with discharge and follow up instructions. No prescriptions given.

## 2013-07-28 NOTE — ED Notes (Signed)
Pt seen 03/31 by Delice Lesch, MD (neuro).  Pt with hx of chronic daily headaches and vision problems since a fall in 2014.

## 2013-07-28 NOTE — ED Notes (Signed)
Pt c/o low back pain and headache onset at 0100 today. Pt and family reports that pt fell May 1st 2014 and has had neurological problems since. Pt denies recent injury or excessive lifting or moving. Pt reports dizziness and blurred vision.

## 2013-07-28 NOTE — ED Provider Notes (Signed)
Medical screening examination/treatment/procedure(s) were performed by non-physician practitioner and as supervising physician I was immediately available for consultation/collaboration.   EKG Interpretation None        Charles B. Karle Starch, MD 07/28/13 1540

## 2013-07-28 NOTE — ED Provider Notes (Signed)
CSN: [id number]     Arrival date & time 07/28/13  1146 History   First MD Initiated Contact with Patient 07/28/13 1220     Chief Complaint  Patient presents with  . Back Pain  . Headache     (Consider location/radiation/quality/duration/timing/severity/associated sxs/prior Treatment) HPI  History of Present Illness   Patient Identification VINH SACHS is a 43 y.o. male.  Patient information was obtained from patient. History/Exam limitations: none. Patient presented to the Emergency Department by private vehicle and ambulatory.  Chief Complaint  Back Pain and Headache   Patient presents with complaint of back pain. This is a result of a fall and Aug 24 2012. CHRONIC back pain and headache since the fall  and has been acute since yesterday. The pain is located in mid lower back, described as sharp and rated as severe, with radiation. Symptoms include no other symptoms.  The patient denies weakness, numbness, incontinence, dysuria, hematuria, abdominal pain, fever, tingling, leg pain, leg weakness, chest pain. The patient denies other injuries. Care prior to arrival consisted of fenatnyl patch ahd chronic pain meds with no relief. Patient headache is chronic and unchanged from May 2014. Global, aching, constant .Denies photophobia, phonophobia, UL throbbing, N/V, visual changes, stiff neck, neck pain, rash, or "thunderclap" onset.    Past Medical History  Diagnosis Date  . Hyperlipidemia   . Hypertension   . Obesity   . OSA (obstructive sleep apnea)   . Gout   . Legal blindness   . Knee pain, bilateral   . Tobacco abuse   . Carpal tunnel syndrome   . Condylomata acuminata in male   . Seizures    Family History  Problem Relation Age of Onset  . High blood pressure Mother   . Cancer Maternal Grandmother    No current facility-administered medications for this encounter.   Current Outpatient Prescriptions  Medication Sig Dispense Refill  . amLODipine (NORVASC) 10  MG tablet Take 1 tablet (10 mg total) by mouth daily.  90 tablet  3  . citalopram (CELEXA) 40 MG tablet Take 1 tablet (40 mg total) by mouth daily.  30 tablet  3  . hydrochlorothiazide (HYDRODIURIL) 25 MG tablet Take 1 tablet (25 mg total) by mouth daily.  90 tablet  3  . ibuprofen (ADVIL,MOTRIN) 200 MG tablet Take 200 mg by mouth every 6 (six) hours as needed for pain.      Marland Kitchen levETIRAcetam (KEPPRA) 500 MG tablet Take 1 tablet (500 mg total) by mouth 2 (two) times daily.  28 tablet  0  . metoprolol (LOPRESSOR) 100 MG tablet Take 1 tablet (100 mg total) by mouth 2 (two) times daily.  60 tablet  11  . omeprazole (PRILOSEC) 40 MG capsule Take 1 capsule (40 mg total) by mouth daily.  30 capsule  6  . tiZANidine (ZANAFLEX) 4 MG capsule Take 1 capsule (4 mg total) by mouth 3 (three) times daily as needed for muscle spasms.  30 capsule  3  . topiramate (TOPAMAX) 200 MG tablet Take 200 mg by mouth daily.      Marland Kitchen torsemide (DEMADEX) 20 MG tablet Take 1 tablet (20 mg total) by mouth daily as needed (swelling).  30 tablet  3  . UNABLE TO FIND Place 1 patch onto the skin daily. Medrol Patch       Allergies  Allergen Reactions  . Other Anaphylaxis    [place] nuts   History   Social History  . Marital Status: Single  Spouse Name: N/A    Number of Children: N/A  . Years of Education: N/A   Occupational History  . shipping and recieving     employer: [employer]    Social History Main Topics  . Smoking status: Current Every Day Smoker -- 0.50 packs/day    Types: Cigarettes  . Smokeless tobacco: Never Used     Comment: decreased smoking  . Alcohol Use: No  . Drug Use: No  . Sexual Activity: Not on file   Other Topics Concern  . Not on file   Social History Narrative   Pt currently lives with brother and sister in law    Review of Systems Ten systems reviewed and are negative for acute change, except as noted in the HPI.     Physical Exam   BP 128/87  Pulse 75  Temp(Src) 97.9 F (36.6 C)  (Oral)  Resp 16  Ht 5\' 9"  (1.753 m)  Wt 370 lb (167.831 kg)  BMI 54.61 kg/m2  SpO2 100% BP 128/87  Pulse 75  Temp(Src) 97.9 F (36.6 C) (Oral)  Resp 16  Ht 5\' 9"  (1.753 m)  Wt 370 lb (167.831 kg)  BMI 54.61 kg/m2  SpO2 100%  General Appearance:    Alert, cooperative, no distress, appears stated age  Head:    Normocephalic, without obvious abnormality, atraumatic  Eyes:   L eft eye with exotropia, hx blindness, only responds to indirect light. conjunctiva/corneas clear, , fundi    benign, both eyes       Ears:    Normal TM's and external ear canals, both ears  Nose:   Nares normal, septum midline, mucosa normal, no drainage    or sinus tenderness  Throat:   Lips, mucosa, and tongue normal; teeth and gums normal  Neck:   Supple, symmetrical, trachea midline, no adenopathy;       thyroid:  No enlargement/tenderness/nodules; no carotid   bruit or JVD  Back:     Symmetric, no curvature, ROM normal, no CVA tenderness  Lungs:     Clear to auscultation bilaterally, respirations unlabored  Chest wall:    No tenderness or deformity  Heart:    Regular rate and rhythm, S1 and S2 normal, no murmur, rub   or gallop  Abdomen:     Soft, non-tender, bowel sounds active all four quadrants,    no masses, no organomegaly  Genitalia:    Normal male without lesion, discharge or tenderness  Rectal:    Normal tone, normal prostate, no masses or tenderness;   guaiac negative stool  Extremities:   Extremities normal, atraumatic, no cyanosis or edema  Pulses:   2+ and symmetric all extremities  Skin:   Skin color, texture, turgor normal, no rashes or lesions  Lymph nodes:   Cervical, supraclavicular, and axillary nodes normal  Neurologic:   CNII-XII intact. Normal strength, sensation and reflexes      throughout    ED Course   Studies: None indicated.  Records Reviewed: medical chart  Treatments: compazine toradol and beadryl  Consultations: none  Disposition: Home follow up with Dr. Rennis Harding who is managing his pain. Return precautions discussed.   Review of Systems   As above  Allergies  Other  Home Medications     Physical Exam As above  ED Course  Procedures (including critical care time) Labs Review Labs Reviewed - No data to display Imaging Review No results found.   EKG Interpretation None  MDM   Final diagnoses:  Chronic headache  Chronic back pain    Patient with chronic pain. Acute on chrinic back pain today. No red flags. No concern for SAH, meningitis, temporal arteritis. Pain control here. Discussed ed chronic pain policy. F/u with pcp/ pain management, Patient has seen neurology for his headaches who referred to psychotherapy.  The patient appears reasonably screened and/or stabilized for discharge and I doubt any other medical condition or other [facility] requiring further screening, evaluation, or treatment in the ED at this time prior to discharge.    Margarita Mail, PA-C 07/28/13 1538

## 2013-07-28 NOTE — Discharge Instructions (Signed)
Chronic Pain Discharge Instructions  Emergency care providers appreciate that many patients coming to [place] are in severe pain and we wish to address their pain in the safest, most responsible manner.  It is important to recognize however, that the proper treatment of chronic pain differs from that of the pain of injuries and acute illnesses.  Our goal is to provide quality, safe, personalized care and we thank you for giving [place] the opportunity to serve you. The use of narcotics and related agents for chronic pain syndromes may lead to additional physical and psychological problems.  Nearly as many people die from prescription narcotics each year as die from car crashes.  Additionally, this risk is increased if such prescriptions are obtained from a variety of sources.  Therefore, only your primary care physician or a pain management specialist is able to safely treat such syndromes with narcotic medications long-term.    Documentation revealing such prescriptions have been sought from multiple sources may prohibit [place] from providing a refill or different narcotic medication.  Your name may be checked first through the [employer].  This database is a record of controlled substance medication prescriptions that the patient has received.  This has been established by [facility] in an effort to eliminate the dangerous, and often life threatening, practice of obtaining multiple prescriptions from different medical providers.   If you have a chronic pain syndrome (i.e. chronic headaches, recurrent back or neck pain, dental pain, abdominal or pelvis pain without a specific diagnosis, or neuropathic pain such as fibromyalgia) or recurrent visits for the same condition without an acute diagnosis, you may be treated with non-narcotics and other non-addictive medicines.  Allergic reactions or negative side effects that may be reported by a patient to such medications will not  typically lead to the use of a narcotic analgesic or other controlled substance as an alternative.   Patients managing chronic pain with a personal physician should have provisions in place for breakthrough pain.  If you are in crisis, you should call your physician.  If your physician directs you to the emergency department, please have the doctor call and speak to our attending physician concerning your care.   When patients come to the Emergency Department (ED) with acute medical conditions in which the Emergency Department physician feels appropriate to prescribe narcotic or sedating pain medication, the physician will prescribe these in very limited quantities.  The amount of these medications will last only until you can see your primary care physician in his/her office.  Any patient who returns to the ED seeking refills should expect only non-narcotic pain medications.   In the event of an acute medical condition exists and the emergency physician feels it is necessary that the patient be given a narcotic or sedating medication -  a responsible adult driver should be present in the room prior to the medication being given by the nurse.   Prescriptions for narcotic or sedating medications that have been lost, stolen or expired will not be refilled in the Emergency Department.    Patients who have chronic pain may receive non-narcotic prescriptions until seen by their primary care physician.  It is every patients personal responsibility to maintain active prescriptions with his or her primary care physician or specialist.  SEEK IMMEDIATE MEDICAL ATTENTION IF: New numbness, tingling, weakness, or problem with the use of your arms or legs.  Severe back pain not relieved with medications.  Change in bowel or bladder control.  Increasing pain in any areas of the body (such as chest or abdominal pain).  Shortness of breath, dizziness or fainting.  Nausea (feeling sick to your stomach), vomiting,  fever, or sweats. You are having a headache. No specific cause was found today for your headache. It may have been a migraine or other cause of headache. Stress, anxiety, fatigue, and depression are common triggers for headaches. Your headache today does not appear to be life-threatening or require hospitalization, but often the exact cause of headaches is not determined in the emergency department. Therefore, follow-up with your doctor is very important to find out what may have caused your headache, and whether or not you need any further diagnostic testing or treatment. Sometimes headaches can appear benign (not harmful), but then more serious symptoms can develop which should prompt an immediate re-evaluation by your doctor or the emergency department. SEEK MEDICAL ATTENTION IF: You develop possible problems with medications prescribed.  The medications don't resolve your headache, if it recurs , or if you have multiple episodes of vomiting or can't take fluids. You have a change from the usual headache. RETURN IMMEDIATELY IF you develop a sudden, severe headache or confusion, become poorly responsive or faint, develop a fever above 100.74F or problem breathing, have a change in speech, vision, swallowing, or understanding, or develop new weakness, numbness, tingling, incoordination, or have a seizure.

## 2013-08-07 ENCOUNTER — Encounter: Payer: Self-pay | Admitting: Emergency Medicine

## 2013-08-07 ENCOUNTER — Telehealth: Payer: Self-pay | Admitting: Emergency Medicine

## 2013-08-07 ENCOUNTER — Ambulatory Visit (INDEPENDENT_AMBULATORY_CARE_PROVIDER_SITE_OTHER): Payer: Medicare Other | Admitting: Emergency Medicine

## 2013-08-07 VITALS — BP 132/92 | HR 76 | Temp 98.3°F | Ht 69.0 in | Wt 371.0 lb

## 2013-08-07 DIAGNOSIS — R51 Headache: Secondary | ICD-10-CM

## 2013-08-07 DIAGNOSIS — R259 Unspecified abnormal involuntary movements: Secondary | ICD-10-CM

## 2013-08-07 DIAGNOSIS — F329 Major depressive disorder, single episode, unspecified: Secondary | ICD-10-CM

## 2013-08-07 DIAGNOSIS — F3289 Other specified depressive episodes: Secondary | ICD-10-CM | POA: Diagnosis not present

## 2013-08-07 DIAGNOSIS — G4730 Sleep apnea, unspecified: Secondary | ICD-10-CM

## 2013-08-07 DIAGNOSIS — I10 Essential (primary) hypertension: Secondary | ICD-10-CM

## 2013-08-07 DIAGNOSIS — F8081 Childhood onset fluency disorder: Secondary | ICD-10-CM | POA: Diagnosis not present

## 2013-08-07 DIAGNOSIS — F32A Depression, unspecified: Secondary | ICD-10-CM

## 2013-08-07 DIAGNOSIS — G252 Other specified forms of tremor: Secondary | ICD-10-CM

## 2013-08-07 NOTE — Progress Notes (Signed)
   Subjective:    Patient ID: Mike Davenport, male    DOB: [birth year], 43 y.o.   MRN: [id number]  HPI Mike Davenport is here for f/u mood and htn.  Hypertension Much better controlled.  Tolerating medications well.  No side effects.  Mood Patient and finacee report that his mood is much better on the celexa.  He still has some depressed mood but these are mostly related to his chronic medication issues - including pain and headache.  Other He has still not been contacted by [facility] about the CPAP. He has also developed a resting tremor in the right leg following a back injection.  He has f/u with pain management next week. Continues to have daily headache. Saw Dr. Delice Lesch in neurology who recommended CBT.  Current Outpatient Prescriptions on File Prior to Visit  Medication Sig Dispense Refill  . amLODipine (NORVASC) 10 MG tablet Take 1 tablet (10 mg total) by mouth daily.  90 tablet  3  . citalopram (CELEXA) 40 MG tablet Take 1 tablet (40 mg total) by mouth daily.  30 tablet  3  . hydrochlorothiazide (HYDRODIURIL) 25 MG tablet Take 1 tablet (25 mg total) by mouth daily.  90 tablet  3  . ibuprofen (ADVIL,MOTRIN) 200 MG tablet Take 200 mg by mouth every 6 (six) hours as needed for pain.      Marland Kitchen levETIRAcetam (KEPPRA) 500 MG tablet Take 1 tablet (500 mg total) by mouth 2 (two) times daily.  28 tablet  0  . metoprolol (LOPRESSOR) 100 MG tablet Take 1 tablet (100 mg total) by mouth 2 (two) times daily.  60 tablet  11  . omeprazole (PRILOSEC) 40 MG capsule Take 1 capsule (40 mg total) by mouth daily.  30 capsule  6  . tiZANidine (ZANAFLEX) 4 MG capsule Take 1 capsule (4 mg total) by mouth 3 (three) times daily as needed for muscle spasms.  30 capsule  3  . topiramate (TOPAMAX) 200 MG tablet Take 200 mg by mouth daily.      Marland Kitchen torsemide (DEMADEX) 20 MG tablet Take 1 tablet (20 mg total) by mouth daily as needed (swelling).  30 tablet  3  . UNABLE TO FIND Place 1 patch onto the skin daily.  Medrol Patch       No current facility-administered medications on file prior to visit.    I have reviewed and updated the following as appropriate: allergies and current medications SHx: current smoker  Review of Systems See HPI    Objective:   Physical Exam BP 132/92  Pulse 76  Temp(Src) 98.3 F (36.8 C) (Oral)  Ht 5\' 9"  (1.753 m)  Wt 371 lb (168.284 kg)  BMI 54.76 kg/m2 Gen: alert, cooperative, NAD, not very talkative today HEENT: AT/[place], sclera white, MMM Neck: supple CV: RRR, no murmurs Pulm: CTAB, no wheezes or rales Ext: no edema; resting tremor noted in right leg     Assessment & Plan:

## 2013-08-07 NOTE — Assessment & Plan Note (Signed)
Appreciate Dr. Amparo Bristol assistance in neurology. Medication management has not been effective. Per neurology's recommendation, I have referred to psychotherapy for CBT.  This will hopefully help with the stutter and pain as well.  F/u in 1 month.

## 2013-08-07 NOTE — Assessment & Plan Note (Signed)
Spoke with [facility] regarding CPAP.   They did not have a phone number for the patient, I provided this information.

## 2013-08-07 NOTE — Patient Instructions (Signed)
It was nice to see you!  I have put in the referral for psychotherapy.  You should hear something in the next 2 weeks.  I will call [place] about the CPAP.  I would like to see you back in 1 month.

## 2013-08-07 NOTE — Telephone Encounter (Signed)
Please call the patient and inform me that I cannot place a referral for psychotherapy and CBT.  He will need to call the provider directly to set up an appointment.  He can find providers in the area through [email or web address] and searching for cognitive therapy.

## 2013-08-07 NOTE — Assessment & Plan Note (Addendum)
Much better today. Will continue amlodipine 10mg , metoprolol 100mg m BID, HCTZ 25mg . F/u 1 month.

## 2013-08-07 NOTE — Assessment & Plan Note (Addendum)
Better on celexa.  Continue celexa 40mg  daily. Consider changing to Effexor for headache, but given improvement of mood I am reluctant to do this. F/u in 1 month.

## 2013-08-08 NOTE — Telephone Encounter (Signed)
Called pt. Informed of message below. Pt will check the web site. Mike Davenport

## 2013-08-17 ENCOUNTER — Encounter (HOSPITAL_COMMUNITY): Payer: Self-pay | Admitting: Emergency Medicine

## 2013-08-17 DIAGNOSIS — E669 Obesity, unspecified: Secondary | ICD-10-CM | POA: Diagnosis not present

## 2013-08-17 DIAGNOSIS — G8929 Other chronic pain: Secondary | ICD-10-CM | POA: Diagnosis not present

## 2013-08-17 DIAGNOSIS — I10 Essential (primary) hypertension: Secondary | ICD-10-CM | POA: Insufficient documentation

## 2013-08-17 DIAGNOSIS — Z9181 History of falling: Secondary | ICD-10-CM | POA: Insufficient documentation

## 2013-08-17 DIAGNOSIS — R51 Headache: Secondary | ICD-10-CM | POA: Diagnosis not present

## 2013-08-17 DIAGNOSIS — F17200 Nicotine dependence, unspecified, uncomplicated: Secondary | ICD-10-CM | POA: Insufficient documentation

## 2013-08-17 NOTE — ED Notes (Addendum)
Pt reports posterior sharp HA progressively worsening x 1 hour. Hx of fall in May 2014, ever since reports chronic HA's (states this feels like normal HA). Denies taking anything for pain. Denies photophobia, phonophobia, N/V, visual changes, stiff neck, neck pain, or "thunderclap" onset. Neuro intact. AO x4. Pt noted to have stuttering which is normal post fall.

## 2013-08-18 ENCOUNTER — Emergency Department (HOSPITAL_COMMUNITY)
Admission: EM | Admit: 2013-08-18 | Discharge: 2013-08-18 | Discharge status: Against Medical Advice | Payer: Medicare Other | Attending: Emergency Medicine | Admitting: Emergency Medicine

## 2013-08-19 ENCOUNTER — Encounter (HOSPITAL_COMMUNITY): Payer: Self-pay | Admitting: Emergency Medicine

## 2013-08-19 ENCOUNTER — Emergency Department (HOSPITAL_COMMUNITY)
Admission: EM | Admit: 2013-08-19 | Discharge: 2013-08-19 | Disposition: A | Discharge status: Home / Self Care | Payer: Medicare Other | Attending: Emergency Medicine | Admitting: Emergency Medicine

## 2013-08-19 DIAGNOSIS — E669 Obesity, unspecified: Secondary | ICD-10-CM | POA: Diagnosis not present

## 2013-08-19 DIAGNOSIS — I10 Essential (primary) hypertension: Secondary | ICD-10-CM | POA: Diagnosis not present

## 2013-08-19 DIAGNOSIS — G8929 Other chronic pain: Secondary | ICD-10-CM | POA: Insufficient documentation

## 2013-08-19 DIAGNOSIS — Z8619 Personal history of other infectious and parasitic diseases: Secondary | ICD-10-CM | POA: Diagnosis not present

## 2013-08-19 DIAGNOSIS — F17200 Nicotine dependence, unspecified, uncomplicated: Secondary | ICD-10-CM | POA: Insufficient documentation

## 2013-08-19 DIAGNOSIS — G40909 Epilepsy, unspecified, not intractable, without status epilepticus: Secondary | ICD-10-CM | POA: Insufficient documentation

## 2013-08-19 DIAGNOSIS — R51 Headache: Secondary | ICD-10-CM | POA: Insufficient documentation

## 2013-08-19 DIAGNOSIS — H548 Legal blindness, as defined in USA: Secondary | ICD-10-CM | POA: Diagnosis not present

## 2013-08-19 DIAGNOSIS — Z79899 Other long term (current) drug therapy: Secondary | ICD-10-CM | POA: Insufficient documentation

## 2013-08-19 DIAGNOSIS — R519 Headache, unspecified: Secondary | ICD-10-CM

## 2013-08-19 LAB — CBG MONITORING, ED: Glucose-Capillary: 86 mg/dL (ref 70–99)

## 2013-08-19 LAB — I-STAT CHEM 8, ED
BUN: 12 mg/dL (ref 6–23)
Calcium, Ion: 1.12 mmol/L (ref 1.12–1.23)
Chloride: 103 mEq/L (ref 96–112)
Creatinine, Ser: 1.2 mg/dL (ref 0.50–1.35)
GLUCOSE: 85 mg/dL (ref 70–99)
HCT: 52 % (ref 39.0–52.0)
Hemoglobin: 17.7 g/dL — ABNORMAL HIGH (ref 13.0–17.0)
Potassium: 3.3 mEq/L — ABNORMAL LOW (ref 3.7–5.3)
Sodium: 136 mEq/L — ABNORMAL LOW (ref 137–147)
TCO2: 23 mmol/L (ref 0–100)

## 2013-08-19 MED ORDER — DIPHENHYDRAMINE HCL 50 MG/ML IJ SOLN
50.0000 mg | Freq: Once | INTRAMUSCULAR | Status: AC
  Administered 2013-08-19: 50 mg via INTRAMUSCULAR
  Filled 2013-08-19: qty 1

## 2013-08-19 MED ORDER — LORAZEPAM 2 MG/ML IJ SOLN
1.0000 mg | Freq: Once | INTRAMUSCULAR | Status: AC
  Administered 2013-08-19: 1 mg via INTRAMUSCULAR
  Filled 2013-08-19: qty 1

## 2013-08-19 MED ORDER — METOCLOPRAMIDE HCL 5 MG/ML IJ SOLN
10.0000 mg | Freq: Once | INTRAMUSCULAR | Status: AC
  Administered 2013-08-19: 10 mg via INTRAMUSCULAR
  Filled 2013-08-19: qty 2

## 2013-08-19 NOTE — ED Provider Notes (Signed)
CSN: [id number]     Arrival date & time 08/19/13  5277 History   First MD Initiated Contact with Patient 08/19/13 [phone]     Chief Complaint  Patient presents with  . Headache     (Consider location/radiation/quality/duration/timing/severity/associated sxs/prior Treatment) Patient is a 43 y.o. male presenting with headaches. The history is provided by the patient.  Headache  patient here complaining of worsening chronic headaches. Does go to a pain management specialist for this. Headache have been present for about a year now. Today while doing laundry became worse. Pain was severe and patient was near syncopal. Denies any associated chest pain she is breath. No vomiting. No fever or chills. No neck pain photophobia. Symptoms are persistent. Use his home medications without relief. No reported seizure activity. No recent black or bloody stools.  Past Medical History  Diagnosis Date  . Hyperlipidemia   . Hypertension   . Obesity   . OSA (obstructive sleep apnea)   . Gout   . Legal blindness   . Knee pain, bilateral   . Tobacco abuse   . Carpal tunnel syndrome   . Condylomata acuminata in male   . Seizures    Past Surgical History  Procedure Laterality Date  . 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    Family History  Problem Relation Age of Onset  . High blood pressure Mother   . Cancer Maternal Grandmother    History  Substance Use Topics  . Smoking status: Current Every Day Smoker -- 0.50 packs/day    Types: Cigarettes  . Smokeless tobacco: Never Used     Comment: decreased smoking  . Alcohol Use: No    Review of Systems  Neurological: Positive for headaches.  All other systems reviewed and are negative.     Allergies  Other  Home Medications   Prior to Admission medications   Medication Sig Start Date End Date Taking? Authorizing Provider  amLODipine (NORVASC) 10 MG tablet Take 1 tablet  (10 mg total) by mouth daily. 05/23/13   Melony Overly, MD  citalopram (CELEXA) 40 MG tablet Take 1 tablet (40 mg total) by mouth daily. 07/09/13   Melony Overly, MD  hydrochlorothiazide (HYDRODIURIL) 25 MG tablet Take 1 tablet (25 mg total) by mouth daily. 07/09/13   Melony Overly, MD  ibuprofen (ADVIL,MOTRIN) 200 MG tablet Take 400 mg by mouth every 6 (six) hours as needed for pain.     Historical Provider, MD  levETIRAcetam (KEPPRA) 500 MG tablet Take 1 tablet (500 mg total) by mouth 2 (two) times daily. 08/29/12   John-Adam Bonk, MD  metoprolol (LOPRESSOR) 100 MG tablet Take 1 tablet (100 mg total) by mouth 2 (two) times daily. 07/31/12   Melony Overly, MD  omeprazole (PRILOSEC) 40 MG capsule Take 1 capsule (40 mg total) by mouth daily. 05/31/13   Melony Overly, MD  tiZANidine (ZANAFLEX) 4 MG capsule Take 1 capsule (4 mg total) by mouth 3 (three) times daily as needed for muscle spasms. 07/24/13   Cameron Sprang, MD  topiramate (TOPAMAX) 200 MG tablet Take 200 mg by mouth daily.    Historical Provider, MD  torsemide (DEMADEX) 20 MG tablet Take 1 tablet (20 mg total) by mouth daily as needed (swelling). 04/30/13   Melony Overly, MD   BP 139/97  Temp(Src) 98.4 F (36.9 C) (Oral)  Resp 19  SpO2  97% Physical Exam  Nursing note and vitals reviewed. Constitutional: He is oriented to person, place, and time. He appears well-developed and well-nourished.  Non-toxic appearance. No distress.  HENT:  Head: Normocephalic and atraumatic.  Eyes: Conjunctivae, EOM and lids are normal. Pupils are equal, round, and reactive to light.  Neck: Normal range of motion. Neck supple. No tracheal deviation present. No mass present.  Cardiovascular: Normal rate, regular rhythm and normal heart sounds.  Exam reveals no gallop.   No murmur heard. Pulmonary/Chest: Effort normal and breath sounds normal. No stridor. No respiratory distress. He has no decreased breath sounds. He has no wheezes. He has no rhonchi. He has no rales.   Abdominal: Soft. Normal appearance and bowel sounds are normal. He exhibits no distension. There is no tenderness. There is no rebound and no CVA tenderness.  Musculoskeletal: Normal range of motion. He exhibits no edema and no tenderness.  Neurological: He is alert and oriented to person, place, and time. He has normal strength. No cranial nerve deficit or sensory deficit. GCS eye subscore is 4. GCS verbal subscore is 5. GCS motor subscore is 6.  Skin: Skin is warm and dry. No abrasion and no rash noted.  Psychiatric: He has a normal mood and affect. His speech is normal and behavior is normal.    ED Course  Procedures (including critical care time) Labs Review Labs Reviewed  I-STAT CHEM 8, ED - Abnormal; Notable for the following:    Sodium 136 (*)    Potassium 3.3 (*)    Hemoglobin 17.7 (*)    All other components within normal limits  CBG MONITORING, ED    Imaging Review No results found.   EKG Interpretation   Date/Time:  Sunday August 19 2013 09:41:07 EDT Ventricular Rate:  70 PR Interval:  189 QRS Duration: 84 QT Interval:  397 QTC Calculation: 428 R Axis:   19 Text Interpretation:  Sinus rhythm Ventricular premature complex Low  voltage, precordial leads Nonspecific repol abnormality, inferior leads No  significant change since last tracing Confirmed by Jenell Dobransky  MD, Jerzie Bieri  ([id number]) on 08/19/2013 9:58:44 AM      MDM   Final diagnoses:  None    Patient given headache oxycodone feels better. No concern for subarachnoid hemorrhage or meningitis. Suspect that this is from his chronic headaches and is stable for discharge. Repeat neurological exam at time of discharge remains unchanged.   Leota Jacobsen, MD 08/19/13 [phone]

## 2013-08-19 NOTE — Discharge Instructions (Signed)

## 2013-08-19 NOTE — ED Notes (Signed)
Pt discharged home with all belongings, pt alert, oriented, and ambulatory upon discharge, no new rx prescribed, pt verbalizes understanding of discharge instructions, pt driven home by friend

## 2013-08-19 NOTE — ED Notes (Signed)
Pt presents with a headache and lower back pain, pt reports a hx 1 year of the same and is currently being evaluated by a Neurologist for pain. Pt reports increase pain today. No neuro deficits noted, grips and strengths equal bilaterally, no slurred speech or facial droop

## 2013-08-19 NOTE — ED Notes (Addendum)
Pt states hes been having headaches and backaches for a year. Today he was standing at the washing machine and began to feel "sweaty and cold and tingly like i might black out." he continues to have a headache now but states the other symptoms are better. He is A&Ox4. He states he has been evaluated for the headaches in the past and he also came in to be seen here on Friday but didn't feel like waiting

## 2013-08-19 NOTE — ED Notes (Signed)
CBG 86. 

## 2013-08-22 ENCOUNTER — Other Ambulatory Visit: Payer: Self-pay | Admitting: Emergency Medicine

## 2013-08-31 ENCOUNTER — Other Ambulatory Visit: Payer: Self-pay | Admitting: Emergency Medicine

## 2013-08-31 NOTE — Telephone Encounter (Signed)
Will fwd request to MD.  Mike Davenport, CMA  

## 2013-08-31 NOTE — Telephone Encounter (Signed)
Pt called and needs both of his medications refilled. He said that the pharmacy has been faxing [place] with no response. jw

## 2013-09-04 ENCOUNTER — Ambulatory Visit: Payer: Self-pay | Admitting: Emergency Medicine

## 2013-09-11 ENCOUNTER — Ambulatory Visit: Payer: Self-pay | Admitting: Emergency Medicine

## 2013-09-28 ENCOUNTER — Other Ambulatory Visit: Payer: Self-pay | Admitting: Emergency Medicine

## 2013-11-19 ENCOUNTER — Other Ambulatory Visit: Payer: Self-pay | Admitting: *Deleted

## 2013-11-19 DIAGNOSIS — F32A Depression, unspecified: Secondary | ICD-10-CM

## 2013-11-19 DIAGNOSIS — F329 Major depressive disorder, single episode, unspecified: Secondary | ICD-10-CM

## 2013-11-19 MED ORDER — CITALOPRAM HYDROBROMIDE 40 MG PO TABS: 40.0000 mg | ORAL_TABLET | Freq: Every day | ORAL | Status: DC

## 2013-12-03 ENCOUNTER — Encounter (HOSPITAL_COMMUNITY): Payer: Self-pay | Admitting: Emergency Medicine

## 2013-12-03 ENCOUNTER — Emergency Department (HOSPITAL_COMMUNITY)
Admission: EM | Admit: 2013-12-03 | Discharge: 2013-12-03 | Disposition: A | Discharge status: Home / Self Care | Payer: Medicare Other | Attending: Emergency Medicine | Admitting: Emergency Medicine

## 2013-12-03 DIAGNOSIS — E785 Hyperlipidemia, unspecified: Secondary | ICD-10-CM | POA: Insufficient documentation

## 2013-12-03 DIAGNOSIS — Z8619 Personal history of other infectious and parasitic diseases: Secondary | ICD-10-CM | POA: Diagnosis not present

## 2013-12-03 DIAGNOSIS — Z791 Long term (current) use of non-steroidal anti-inflammatories (NSAID): Secondary | ICD-10-CM | POA: Insufficient documentation

## 2013-12-03 DIAGNOSIS — H60399 Other infective otitis externa, unspecified ear: Secondary | ICD-10-CM | POA: Insufficient documentation

## 2013-12-03 DIAGNOSIS — R569 Unspecified convulsions: Secondary | ICD-10-CM | POA: Insufficient documentation

## 2013-12-03 DIAGNOSIS — Z79899 Other long term (current) drug therapy: Secondary | ICD-10-CM | POA: Insufficient documentation

## 2013-12-03 DIAGNOSIS — E669 Obesity, unspecified: Secondary | ICD-10-CM | POA: Insufficient documentation

## 2013-12-03 DIAGNOSIS — F17200 Nicotine dependence, unspecified, uncomplicated: Secondary | ICD-10-CM | POA: Diagnosis not present

## 2013-12-03 DIAGNOSIS — I10 Essential (primary) hypertension: Secondary | ICD-10-CM | POA: Insufficient documentation

## 2013-12-03 DIAGNOSIS — Z862 Personal history of diseases of the blood and blood-forming organs and certain disorders involving the immune mechanism: Secondary | ICD-10-CM | POA: Diagnosis not present

## 2013-12-03 DIAGNOSIS — Z792 Long term (current) use of antibiotics: Secondary | ICD-10-CM | POA: Insufficient documentation

## 2013-12-03 DIAGNOSIS — Z8739 Personal history of other diseases of the musculoskeletal system and connective tissue: Secondary | ICD-10-CM | POA: Insufficient documentation

## 2013-12-03 DIAGNOSIS — Z8639 Personal history of other endocrine, nutritional and metabolic disease: Secondary | ICD-10-CM | POA: Insufficient documentation

## 2013-12-03 DIAGNOSIS — H9209 Otalgia, unspecified ear: Secondary | ICD-10-CM | POA: Diagnosis not present

## 2013-12-03 DIAGNOSIS — H6092 Unspecified otitis externa, left ear: Secondary | ICD-10-CM

## 2013-12-03 MED ORDER — HYDROCODONE-ACETAMINOPHEN 5-325 MG PO TABS: 1.0000 | ORAL_TABLET | Freq: Four times a day (QID) | ORAL | Status: DC | PRN

## 2013-12-03 MED ORDER — OFLOXACIN 0.3 % OT SOLN: 10.0000 [drp] | Freq: Two times a day (BID) | OTIC | Status: AC

## 2013-12-03 NOTE — ED Notes (Signed)
Pt. reports left ear ache with drainage onset yesterday unrelieved by OTC Ibuprofen .

## 2013-12-03 NOTE — ED Provider Notes (Signed)
CSN: [id number]     Arrival date & time 12/03/13  1906 History  This chart was scribed forCourtney Forcucci, PA-C working with Threasa Beards, MD by Randa Evens, ED Scribe. This patient was seen in room TR05C/TR05C and the patient's care was started at 9:54 PM.      Chief Complaint  Patient presents with  . Otalgia    Patient is a 43 y.o. male presenting with ear pain. The history is provided by the patient. No language interpreter was used.  Otalgia Associated symptoms: ear discharge   Associated symptoms: no congestion, no cough, no fever and no vomiting    HPI Comments: Mike Davenport is a 43 y.o. male who presents to the Emergency Department complaining of throbbing left ear pain onset 1 day prior. He states he has associated  clear ear drainage and facial swelling.  He states the pain radiates down into his jaw. He denies any recent trauma to the ear. He states [place] was [place] the [place] in [place]. He states he has a Hx of hypertension and is legally blind. He states he has taken ibuprofen with no relief. He denies cough, congestion, fever, chills, nausea, vomiting or dental problem.   Past Medical History  Diagnosis Date  . Hyperlipidemia   . Hypertension   . Obesity   . OSA (obstructive sleep apnea)   . Gout   . Legal blindness   . Knee pain, bilateral   . Tobacco abuse   . Carpal tunnel syndrome   . Condylomata acuminata in male   . Seizures    Past Surgical History  Procedure Laterality Date  . 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    Family History  Problem Relation Age of Onset  . High blood pressure Mother   . Cancer Maternal Grandmother    History  Substance Use Topics  . Smoking status: Current Every Day Smoker -- 0.50 packs/day    Types: Cigarettes  . Smokeless tobacco: Never Used     Comment: decreased smoking  . Alcohol Use: No    Review of Systems   Constitutional: Negative for fever and chills.  HENT: Positive for ear discharge, ear pain and facial swelling. Negative for congestion and dental problem.   Respiratory: Negative for cough.   Gastrointestinal: Negative for nausea and vomiting.  All other systems reviewed and are negative.  Allergies  Other  Home Medications   Prior to Admission medications   Medication Sig Start Date End Date Taking? Authorizing Provider  amLODipine (NORVASC) 10 MG tablet Take 1 tablet (10 mg total) by mouth daily. 05/23/13   Melony Overly, MD  citalopram (CELEXA) 40 MG tablet Take 1 tablet (40 mg total) by mouth daily. 11/19/13   Lavon Paganini, MD  hydrochlorothiazide (HYDRODIURIL) 25 MG tablet Take 1 tablet (25 mg total) by mouth daily. 07/09/13   Melony Overly, MD  HYDROcodone-acetaminophen (NORCO/VICODIN) 5-325 MG per tablet Take 1 tablet by mouth every 6 (six) hours as needed for moderate pain or severe pain. 12/03/13   Jaymon Dudek A Forcucci, PA-C  ibuprofen (ADVIL,MOTRIN) 200 MG tablet Take 400 mg by mouth every 6 (six) hours as needed for pain.     Historical Provider, MD  levETIRAcetam (KEPPRA) 500 MG tablet Take 1 tablet (500 mg total) by mouth 2 (two) times daily. 08/29/12   John-Adam Bonk, MD  metoprolol (LOPRESSOR) 100 MG tablet TAKE  1 TABLET (100 MG TOTAL) BY MOUTH 2 (TWO) TIMES DAILY. 08/31/13   Melony Overly, MD  ofloxacin (FLOXIN) 0.3 % otic solution Place 10 drops into the left ear 2 (two) times daily. 12/03/13 12/10/13  Yury Schaus A Forcucci, PA-C  omeprazole (PRILOSEC) 40 MG capsule Take 1 capsule (40 mg total) by mouth daily. 05/31/13   Melony Overly, MD  tiZANidine (ZANAFLEX) 4 MG capsule Take 1 capsule (4 mg total) by mouth 3 (three) times daily as needed for muscle spasms. 07/24/13   Cameron Sprang, MD  topiramate (TOPAMAX) 200 MG tablet Take 200 mg by mouth daily.    Historical Provider, MD  torsemide (DEMADEX) 20 MG tablet TAKE 1 TABLET (20 MG TOTAL) BY MOUTH DAILY AS NEEDED (SWELLING).    Angelica Ran, MD   Triage Vitals: BP 132/89  Pulse 75  Temp(Src) 98.3 F (36.8 C) (Oral)  Resp 16  SpO2 96%  Physical Exam  Nursing note and vitals reviewed. Constitutional: He is oriented to person, place, and time. He appears well-developed and well-nourished. No distress.  HENT:  Head: Normocephalic and atraumatic.  Right Ear: External ear normal.  Left Ear: No lacerations. There is drainage, swelling and tenderness. No foreign bodies.  No middle ear effusion. Decreased hearing is noted.  Nose: Nose normal.  Mouth/Throat: Oropharynx is clear and moist. No oropharyngeal exudate.  Large swelling of the left ear canal with movement pain.  TM cannot be well visualized.  No left mastoid tenderness.  Eyes: Conjunctivae are normal. No scleral icterus.  Neck: Normal range of motion. Neck supple. No JVD present. No thyromegaly present.  Cardiovascular: Normal rate, regular rhythm, normal heart sounds and intact distal pulses.  Exam reveals no gallop and no friction rub.   No murmur heard. Pulmonary/Chest: Effort normal and breath sounds normal. No respiratory distress. He has no wheezes. He has no rales. He exhibits no tenderness.  Musculoskeletal: Normal range of motion.  Lymphadenopathy:    He has no cervical adenopathy.  Neurological: He is alert and oriented to person, place, and time.  Skin: Skin is warm and dry. He is not diaphoretic.  Psychiatric: He has a normal mood and affect. His behavior is normal. Judgment and thought content normal.    ED Course  Procedures (including critical care time) DIAGNOSTIC STUDIES: Oxygen Saturation is 96% on RA, adequate by my interpretation.    COORDINATION OF CARE: 10:04 PM-Discussed treatment plan which includes ear drops with antibiotics and steroids and ENT referral with pt at bedside and pt agreed to plan.     Labs Review Labs Reviewed - No data to display  Imaging Review No results found.   EKG Interpretation None      MDM    Final diagnoses:  Otitis externa, left   Patient is a 43 y.o. Male with left ear pain.  Ear canal is extremely swollen and erythematous on exam.  Left TM was not able to visualized.  Given unknown status of TM perforation patient will be given Floxin otic BID x 1 week and hydrocodone for pain relief.  Patient is afebrile with no signs of mastoiditis or malignant otits media at this time.  Patient was told to follow-up with ENT at this time.  He was given return precautions of mastoiditis.  He states understanding and agreement at this time.     I personally performed the services described in this documentation, which was scribed in my presence. The recorded information has been reviewed and  is accurate.      Cherylann Parr, PA-C 12/04/13 [phone]

## 2013-12-03 NOTE — ED Notes (Signed)
Pt c/o pain and drainage to left ear x's 2 days.  St's pain has gotton worse today.

## 2013-12-03 NOTE — Discharge Instructions (Signed)

## 2013-12-04 NOTE — ED Provider Notes (Signed)
Medical screening examination/treatment/procedure(s) were performed by non-physician practitioner and as supervising physician I was immediately available for consultation/collaboration.   EKG Interpretation None       Threasa Beards, MD 12/04/13 [phone]

## 2013-12-11 DIAGNOSIS — H60399 Other infective otitis externa, unspecified ear: Secondary | ICD-10-CM | POA: Diagnosis not present

## 2013-12-11 DIAGNOSIS — H6120 Impacted cerumen, unspecified ear: Secondary | ICD-10-CM | POA: Diagnosis not present

## 2014-01-22 ENCOUNTER — Encounter: Payer: Self-pay | Admitting: Family Medicine

## 2014-01-22 ENCOUNTER — Ambulatory Visit (INDEPENDENT_AMBULATORY_CARE_PROVIDER_SITE_OTHER): Payer: Medicare Other | Admitting: Family Medicine

## 2014-01-22 VITALS — BP 130/80 | HR 74 | Ht 69.0 in | Wt 390.0 lb

## 2014-01-22 DIAGNOSIS — E669 Obesity, unspecified: Secondary | ICD-10-CM | POA: Diagnosis not present

## 2014-01-22 DIAGNOSIS — K219 Gastro-esophageal reflux disease without esophagitis: Secondary | ICD-10-CM

## 2014-01-22 DIAGNOSIS — F3289 Other specified depressive episodes: Secondary | ICD-10-CM

## 2014-01-22 DIAGNOSIS — G4730 Sleep apnea, unspecified: Secondary | ICD-10-CM | POA: Diagnosis not present

## 2014-01-22 DIAGNOSIS — I10 Essential (primary) hypertension: Secondary | ICD-10-CM

## 2014-01-22 DIAGNOSIS — F32A Depression, unspecified: Secondary | ICD-10-CM

## 2014-01-22 DIAGNOSIS — F329 Major depressive disorder, single episode, unspecified: Secondary | ICD-10-CM

## 2014-01-22 LAB — BASIC METABOLIC PANEL
BUN: 17 mg/dL (ref 6–23)
CO2: 24 mEq/L (ref 19–32)
Calcium: 8.9 mg/dL (ref 8.4–10.5)
Chloride: 103 mEq/L (ref 96–112)
Creat: 1.37 mg/dL — ABNORMAL HIGH (ref 0.50–1.35)
Glucose, Bld: 86 mg/dL (ref 70–99)
Potassium: 3.6 mEq/L (ref 3.5–5.3)
Sodium: 138 mEq/L (ref 135–145)

## 2014-01-22 MED ORDER — AMLODIPINE BESYLATE 10 MG PO TABS: 10.0000 mg | ORAL_TABLET | Freq: Every day | ORAL | Status: DC

## 2014-01-22 MED ORDER — TORSEMIDE 20 MG PO TABS: ORAL_TABLET | ORAL | Status: DC

## 2014-01-22 MED ORDER — CITALOPRAM HYDROBROMIDE 40 MG PO TABS: 40.0000 mg | ORAL_TABLET | Freq: Every day | ORAL | Status: DC

## 2014-01-22 MED ORDER — OMEPRAZOLE 40 MG PO CPDR: 40.0000 mg | DELAYED_RELEASE_CAPSULE | Freq: Every day | ORAL | Status: DC

## 2014-01-22 MED ORDER — METOPROLOL TARTRATE 100 MG PO TABS: ORAL_TABLET | ORAL | Status: DC

## 2014-01-22 MED ORDER — HYDROCHLOROTHIAZIDE 25 MG PO TABS: 25.0000 mg | ORAL_TABLET | Freq: Every day | ORAL | Status: DC

## 2014-01-22 NOTE — Patient Instructions (Signed)
It was nice to meet you today!  Continue with your current blood pressure medications.  I am checking labs today.  Someone will call you or send you a letter with the results soon.  Continue with the celexa for your depression.    You set goals for weight loss today: - Walk for 1 hour every day - Eat 2 servings of vegetables each day.  Come back and see me in 1-2 months to talk about your progress in losing weight.  Best, Dr. Brita Romp (Dr. B)   Exercise to Lose Weight Exercise and a healthy diet may help you lose weight. Your doctor may suggest specific exercises. EXERCISE IDEAS AND TIPS  Choose low-cost things you enjoy doing, such as walking, bicycling, or exercising to workout videos.  Take stairs instead of the elevator.  Walk during your lunch break.  Park your car further away from work or school.  Go to a gym or an exercise class.  Start with 5 to 10 minutes of exercise each day. Build up to 30 minutes of exercise 4 to 6 days a week.  Wear shoes with good support and comfortable clothes.  Stretch before and after working out.  Work out until you breathe harder and your heart beats faster.  Drink extra water when you exercise.  Do not do so much that you hurt yourself, feel dizzy, or get very short of breath. Exercises that burn about 150 calories:  Running 1  miles in 15 minutes.  Playing volleyball for 45 to 60 minutes.  Washing and waxing a car for 45 to 60 minutes.  Playing touch football for 45 minutes.  Walking 1  miles in 35 minutes.  Pushing a stroller 1  miles in 30 minutes.  Playing basketball for 30 minutes.  Raking leaves for 30 minutes.  Bicycling 5 miles in 30 minutes.  Walking 2 miles in 30 minutes.  Dancing for 30 minutes.  Shoveling snow for 15 minutes.  Swimming laps for 20 minutes.  Walking up stairs for 15 minutes.  Bicycling 4 miles in 15 minutes.  Gardening for 30 to 45 minutes.  Jumping rope for 15  minutes.  Washing windows or floors for 45 to 60 minutes. Document Released: 05/15/2010 Document Revised: 07/05/2011 Document Reviewed: 05/15/2010 [facility] Patient Information 2015 [place], [place]. This information is not intended to replace advice given to you by your health care provider. Make sure you discuss any questions you have with your health care provider.

## 2014-01-22 NOTE — Assessment & Plan Note (Signed)
Doing well on omeprazole. Refill sent in today.

## 2014-01-22 NOTE — Assessment & Plan Note (Signed)
Well controlled. Continue amlodipine 10 mg, metoprolol 100 mg BID, HCTZ 25 mg. Check Bmet today. F/u in 3 months.

## 2014-01-22 NOTE — Progress Notes (Signed)
Subjective:   Mike Davenport is a 43 y.o. male with a history of OSA, depression, morbid obesity, hypertension, GERD and here for followup of chronic issues.  1. depression: Currently taking Celexa 40 mg daily. Patient reports mood is doing well and he is finding more pleasure in doing things that he enjoys. Denies SI/HI. Denies any side effects of medication. He spoke to Dr. Bridgett Larsson about CBT in April, but this never got lined up. He is not interested in counseling now.  2. OSA: On CPAP. CPAP usage report scanned to chart. He is feeling well and using his CPAP machine.  3. HTN: He reports compliance with his medications. Denies any side effects or symptoms of hypotension. Denies chest pain, shortness of breath.  4. GERD: Taking Omeprazole.  Doing well.  5. morbid obesity: Patient has gained about 20 pounds since April. He reports that he lost over 100 pounds in the past when he used to weigh about 500 pounds. He would like to lose weight again. He believes that eating better and exercising more will help him achieve this goal.  Review of Systems:  Per HPI. All other systems reviewed and are negative.    Past Medical History: Patient Active Problem List   Diagnosis Date Noted  . Stutter 08/07/2013  . Resting tremor 08/07/2013  . Depression 06/04/2013  . Chronic low back pain 06/04/2013  . Headache(784.0) 05/23/2013  . Concussion 08/29/2012  . Encounter for medication refill 08/17/2011  . Knee pain, right 06/25/2011  . 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 GOUT NOS 02/23/2007  . TOBACCO USE 02/23/2007  . HYPERLIPIDEMIA 06/23/2006  . OBESITY, NOS 06/23/2006  . CARPAL TUNNEL SYNDROME 06/23/2006  . HYPERTENSION, BENIGN SYSTEMIC 06/23/2006  . Chronic diastolic heart failure 41/66/0630  . GASTROESOPHAGEAL REFLUX, NO ESOPHAGITIS 06/23/2006  . OSTEOARTHRITIS, LOWER LEG 06/23/2006  . APNEA, SLEEP 06/23/2006    Medications: reviewed and updated Current Outpatient  Prescriptions  Medication Sig Dispense Refill  . amLODipine (NORVASC) 10 MG tablet Take 1 tablet (10 mg total) by mouth daily.  90 tablet  3  . citalopram (CELEXA) 40 MG tablet Take 1 tablet (40 mg total) by mouth daily.  30 tablet  3  . hydrochlorothiazide (HYDRODIURIL) 25 MG tablet Take 1 tablet (25 mg total) by mouth daily.  90 tablet  3  . HYDROcodone-acetaminophen (NORCO/VICODIN) 5-325 MG per tablet Take 1 tablet by mouth every 6 (six) hours as needed for moderate pain or severe pain.  6 tablet  0  . ibuprofen (ADVIL,MOTRIN) 200 MG tablet Take 400 mg by mouth every 6 (six) hours as needed for pain.       Marland Kitchen levETIRAcetam (KEPPRA) 500 MG tablet Take 1 tablet (500 mg total) by mouth 2 (two) times daily.  28 tablet  0  . metoprolol (LOPRESSOR) 100 MG tablet TAKE 1 TABLET (100 MG TOTAL) BY MOUTH 2 (TWO) TIMES DAILY.  60 tablet  6  . omeprazole (PRILOSEC) 40 MG capsule Take 1 capsule (40 mg total) by mouth daily.  30 capsule  6  . tiZANidine (ZANAFLEX) 4 MG capsule Take 1 capsule (4 mg total) by mouth 3 (three) times daily as needed for muscle spasms.  30 capsule  3  . topiramate (TOPAMAX) 200 MG tablet Take 200 mg by mouth daily.      Marland Kitchen torsemide (DEMADEX) 20 MG tablet TAKE 1 TABLET (20 MG TOTAL) BY MOUTH DAILY AS NEEDED (SWELLING).  30 tablet  3   No current facility-administered medications  for this visit.    Social History: Current smoker  Objective:  BP 131/90  Pulse 74  Ht 5\' 9"  (1.753 m)  Wt 390 lb (176.903 kg)  BMI 57.57 kg/m2  SpO2 98%  Gen:  43 y.o. male sitting in NAD HEENT: NCAT, MMM, EOMI, PERRL, anicteric sclerae CV: RRR, no MRG, no JVD, 2+ DP pulses b/l Resp: Non-labored, CTAB, no wheezes noted, somewhat limited due to body habitus   Abd: Soft, NTND, BS present, no guarding or organomegaly Ext: WWP, no edema/clubbing/cyanosis  MSK: Full ROM, strength intact Neuro: Alert and oriented, speech normal      Chemistry      Component Value Date/Time   NA 136* 08/19/2013  0948   K 3.3* 08/19/2013 0948   CL 103 08/19/2013 0948   CO2 25 05/14/2013 1851   BUN 12 08/19/2013 0948   CREATININE 1.20 08/19/2013 0948   CREATININE 1.18 08/14/2012 0941      Component Value Date/Time   CALCIUM 9.3 05/14/2013 1851   ALKPHOS 49 08/17/2011 1218   AST 25 08/17/2011 1218   ALT 22 08/17/2011 1218   BILITOT 0.6 08/17/2011 1218     Assessment:     Mike Davenport is a 43 y.o. male here for followup of depression, sleep apnea, hypertension, GERD, morbid obesity.    Plan:     See problem list for problem-specific plans.   Lavon Paganini, MD PGY-1,  [facility] 01/22/2014  3:29 PM

## 2014-01-22 NOTE — Assessment & Plan Note (Signed)
Doing well on Celexa 40 mg daily. Continue current medications. Refill sent to pharmacy today. Followup in one month.

## 2014-01-22 NOTE — Assessment & Plan Note (Signed)
Doing well on CPAP. Continue CPAP qhs.

## 2014-01-22 NOTE — Assessment & Plan Note (Signed)
Encourage patient about losing weight. Stressed importance for his health. He set 2 goals to work towards weight loss today. He would like to walk for an hour every day and eat at least 2 servings of vegetables daily. F/u in 1 month

## 2014-01-25 ENCOUNTER — Telehealth: Payer: Self-pay | Admitting: Family Medicine

## 2014-01-25 NOTE — Telephone Encounter (Signed)
Called patient to review lab results from recent BMET.  Electrolytes wnl, but Cr 1.37 (previously 1.20 in 07/2013).  Advised patient not to take any NSAIDs (listed them all in detail).  He reported that currently he is taking Ibuprofen 600mg  about once/week for headache.  I also advised patient to hold Torsemide until next appt.  Patient to call clinic and make f/u appt for 1 month from now.  Will recheck BMET then.   Lavon Paganini, MD, MPH PGY-1,  [facility] 01/25/2014 2:42 PM

## 2014-02-08 ENCOUNTER — Other Ambulatory Visit: Payer: Self-pay | Admitting: *Deleted

## 2014-02-08 DIAGNOSIS — K219 Gastro-esophageal reflux disease without esophagitis: Secondary | ICD-10-CM

## 2014-02-10 MED ORDER — OMEPRAZOLE 40 MG PO CPDR: 40.0000 mg | DELAYED_RELEASE_CAPSULE | Freq: Every day | ORAL | Status: DC

## 2014-02-27 ENCOUNTER — Ambulatory Visit (INDEPENDENT_AMBULATORY_CARE_PROVIDER_SITE_OTHER): Payer: Medicare Other | Admitting: Family Medicine

## 2014-02-27 ENCOUNTER — Encounter: Payer: Self-pay | Admitting: Family Medicine

## 2014-02-27 VITALS — BP 113/75 | HR 88 | Temp 98.0°F | Ht 69.0 in | Wt 392.0 lb

## 2014-02-27 DIAGNOSIS — R600 Localized edema: Secondary | ICD-10-CM | POA: Insufficient documentation

## 2014-02-27 DIAGNOSIS — R350 Frequency of micturition: Secondary | ICD-10-CM

## 2014-02-27 DIAGNOSIS — M25561 Pain in right knee: Secondary | ICD-10-CM | POA: Diagnosis not present

## 2014-02-27 DIAGNOSIS — M545 Low back pain, unspecified: Secondary | ICD-10-CM

## 2014-02-27 DIAGNOSIS — G8929 Other chronic pain: Secondary | ICD-10-CM | POA: Diagnosis not present

## 2014-02-27 DIAGNOSIS — E669 Obesity, unspecified: Secondary | ICD-10-CM

## 2014-02-27 DIAGNOSIS — A5903 Trichomonal cystitis and urethritis: Secondary | ICD-10-CM | POA: Insufficient documentation

## 2014-02-27 LAB — POCT URINALYSIS DIPSTICK
Bilirubin, UA: NEGATIVE
Blood, UA: NEGATIVE
Glucose, UA: NEGATIVE
NITRITE UA: NEGATIVE
PH UA: 6
PROTEIN UA: NEGATIVE
Spec Grav, UA: 1.02
Urobilinogen, UA: 1

## 2014-02-27 LAB — POCT UA - MICROSCOPIC ONLY
Epithelial cells, urine per micros: >20
WBC, Ur, HPF, POC: >20

## 2014-02-27 LAB — GLUCOSE, CAPILLARY: Glucose-Capillary: 119 mg/dL — ABNORMAL HIGH (ref 70–99)

## 2014-02-27 MED ORDER — MELOXICAM 15 MG PO TABS: 15.0000 mg | ORAL_TABLET | Freq: Every day | ORAL | Status: DC

## 2014-02-27 NOTE — Patient Instructions (Signed)
Is nice to see you again today. I am prescribing meloxicam to take for your knee and back pain.  I put in a referral for nutritionist near where you live. Using wear the compression stockings for your leg swelling. I am going to check your urine and your blood sugar for your increased urination.  Urinary Frequency The number of times a normal person urinates depends upon how much liquid they take in and how much liquid they are losing. If the temperature is hot and there is high humidity, then the person will sweat more and usually breathe a little more frequently. These factors decrease the amount of frequency of urination that would be considered normal. The amount you drink is easily determined, but the amount of fluid lost is sometimes more difficult to calculate.  Fluid is lost in two ways:  Sensible fluid loss is usually measured by the amount of urine that you get rid of. Losses of fluid can also occur with diarrhea.  Insensible fluid loss is more difficult to measure. It is caused by evaporation. Insensible loss of fluid occurs through breathing and sweating. It usually ranges from a little less than a quart to a little more than a quart of fluid a day. In normal temperatures and activity levels, the average person may urinate 4 to 7 times in a 24-hour period. Needing to urinate more often than that could indicate a problem. If one urinates 4 to 7 times in 24 hours and has large volumes each time, that could indicate a different problem from one who urinates 4 to 7 times a day and has small volumes. The time of urinating is also important. Most urinating should be done during the waking hours. Getting up at night to urinate frequently can indicate some problems. CAUSES  The bladder is the organ in your lower abdomen that holds urine. Like a balloon, it swells some as it fills up. Your nerves sense this and tell you it is time to head for the bathroom. There are a number of reasons that you might  feel the need to urinate more often than usual. They include:  Urinary tract infection. This is usually associated with other signs such as burning when you urinate.  In men, problems with the prostate (a walnut-size gland that is located near the tube that carries urine out of your body). There are two reasons why the prostate can cause an increased frequency of urination:  An enlarged prostate that does not let the bladder empty well. If the bladder only half empties when you urinate, then it only has half the capacity to fill before you have to urinate again.  The nerves in the bladder become more hypersensitive with an increased size of the prostate even if the bladder empties completely.  Pregnancy.  Obesity. Excess weight is more likely to cause a problem for women than for men.  Bladder stones or other bladder problems.  Caffeine.  Alcohol.  Medications. For example, drugs that help the body get rid of extra fluid (diuretics) increase urine production. Some other medicines must be taken with lots of fluids.  Muscle or nerve weakness. This might be the result of a spinal cord injury, a stroke, multiple sclerosis, or Parkinson disease.  Long-standing diabetes can decrease the sensation of the bladder. This loss of sensation makes it harder to sense the bladder needs to be emptied. Over a period of years, the bladder is stretched out by constant overfilling. This weakens the bladder muscles  so that the bladder does not empty well and has less capacity to fill with new urine.  Interstitial cystitis (also called painful bladder syndrome). This condition develops because the tissues that line the inside of the bladder are inflamed (inflammation is the body's way of reacting to injury or infection). It causes pain and frequent urination. It occurs in women more often than in men. DIAGNOSIS   To decide what might be causing your urinary frequency, your health care provider will  probably:  Ask about symptoms you have noticed.  Ask about your overall health. This will include questions about any medications you are taking.  Do a physical examination.  Order some tests. These might include:  A blood test to check for diabetes or other health issues that could be contributing to the problem.  Urine testing. This could measure the flow of urine and the pressure on the bladder.  A test of your neurological system (the brain, spinal cord, and nerves). This is the system that senses the need to urinate.  A bladder test to check whether it is emptying completely when you urinate.  Cystoscopy. This test uses a thin tube with a tiny camera on it. It offers a look inside your urethra and bladder to see if there are problems.  Imaging tests. You might be given a contrast dye and then asked to urinate. X-rays are taken to see how your bladder is working. TREATMENT  It is important for you to be evaluated to determine if the amount or frequency that you have is unusual or abnormal. If it is found to be abnormal, the cause should be determined and this can usually be found out easily. Depending upon the cause, treatment could include medication, stimulation of the nerves, or surgery. There are not too many things that you can do as an individual to change your urinary frequency. It is important that you balance the amount of fluid intake needed to compensate for your activity and the temperature. Medical problems will be diagnosed and taken care of by your physician. There is no particular bladder training such as Kegel exercises that you can do to help urinary frequency. This is an exercise that is usually recommended for people who have leaking of urine when they laugh, cough, or sneeze. HOME CARE INSTRUCTIONS   Take any medications your health care provider prescribed or suggested. Follow the directions carefully.  Practice any lifestyle changes that are recommended. These  might include:  Drinking less fluid or drinking at different times of the day. If you need to urinate often during the night, for example, you may need to stop drinking fluids early in the evening.  Cutting down on caffeine or alcohol. They both can make you need to urinate more often than normal. Caffeine is found in coffee, tea, and sodas.  Losing weight, if that is recommended.  Keep a journal or a log. You might be asked to record how much you drink and when and where you feel the need to urinate. This will also help evaluate how well the treatment provided by your physician is working. SEEK MEDICAL CARE IF:   Your need to urinate often gets worse.  You feel increased pain or irritation when you urinate.  You notice blood in your urine.  You have questions about any medications that your health care provider recommended.  You notice blood, pus, or swelling at the site of any test or treatment procedure.  You develop a fever of more  than 100.59F (38.1C). SEEK IMMEDIATE MEDICAL CARE IF:  You develop a fever of more than 102.6F (38.9C). Document Released: 02/06/2009 Document Revised: 08/27/2013 Document Reviewed: 02/06/2009 [facility] Patient Information 2015 [place], [place]. This information is not intended to replace advice given to you by your health care provider. Make sure you discuss any questions you have with your health care provider.
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
## 2014-02-27 NOTE — Assessment & Plan Note (Signed)
Urinary frequency, urgency, nocturia. Could be related to UTI, new onset diabetes, caffeine intake. - Will collect urinalysis today. If has pyuria, will treat with antibiotics. - POCT blood glucose today. Nonfasting glucose at last visit approximately 1 month ago within normal limits at 86.
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
## 2014-02-27 NOTE — Addendum Note (Signed)
Addended by: [place], Psalm Schappell on: 02/27/2014 06:08 PM   Modules accepted: Orders

## 2014-02-27 NOTE — Assessment & Plan Note (Signed)
Encourage patient about losing weight. Stressed importance for his health. - Continue working toward goals me at last visit of walking at least 1 hour per day and eating at least 2 servings of vegetables daily. - Referral to nutritionist in [place]. - follow-up in one month.

## 2014-02-27 NOTE — Assessment & Plan Note (Signed)
Lower extremity edema likely related to morbid obesity and dependency.  No history of heart failure. Last echo in 2009 with EF of 97% no diastolic dysfunction. - Continue to hold torsemide. - prescription forcompression stockings given to patient. - continue to work on weight loss. - Advised patient to elevate legs when sitting or lying down.

## 2014-02-28 ENCOUNTER — Telehealth: Payer: Self-pay | Admitting: Family Medicine

## 2014-02-28 DIAGNOSIS — R600 Localized edema: Secondary | ICD-10-CM

## 2014-02-28 DIAGNOSIS — A5903 Trichomonal cystitis and urethritis: Secondary | ICD-10-CM

## 2014-02-28 MED ORDER — METRONIDAZOLE 500 MG PO TABS: 2000.0000 mg | ORAL_TABLET | Freq: Once | ORAL | Status: DC

## 2014-02-28 NOTE — Telephone Encounter (Signed)
Needs  Prescription for compression stockings. [facility] says they need a prescption for the stockings FAX:  [phone]

## 2014-02-28 NOTE — Telephone Encounter (Signed)
Patient found to have many trichomonas in urine specimen after clinic visit.  This is likely causing his dysuria and urinary frequency (urethritis).  Called patient and discussed results.  Patient advised to tell all sexual partners and have them treated as well.  Rx for Flagyl sent to pharmacy.  Patient advised to RTC if symptoms do not improve with Flagyl (consider GC/Chlamydia at that time).  Lavon Paganini, MD, MPH PGY-1,  [facility] 02/28/2014 2:17 PM

## 2014-03-07 NOTE — Telephone Encounter (Signed)
Prescription printed and put in box to be faxed.  Lavon Paganini, MD, MPH PGY-1,  [facility] 03/07/2014 5:17 PM

## 2014-03-19 ENCOUNTER — Other Ambulatory Visit: Payer: Self-pay | Admitting: *Deleted

## 2014-03-19 DIAGNOSIS — I10 Essential (primary) hypertension: Secondary | ICD-10-CM

## 2014-03-19 MED ORDER — METOPROLOL TARTRATE 100 MG PO TABS: ORAL_TABLET | ORAL | Status: DC

## 2014-04-26 HISTORY — PX: CORONARY ANGIOPLASTY: SHX604

## 2014-05-08 ENCOUNTER — Telehealth: Payer: Self-pay | Admitting: Family Medicine

## 2014-05-08 NOTE — Telephone Encounter (Signed)
Left message on voicemail for patient to call back and scheduled and appointment.

## 2014-05-08 NOTE — Telephone Encounter (Signed)
Patient needs f/u for OSA to discuss CPAP usage for insurance company.  Lavon Paganini, MD, MPH PGY-1,  [facility]

## 2014-06-24 ENCOUNTER — Other Ambulatory Visit: Payer: Self-pay | Admitting: *Deleted

## 2014-06-24 DIAGNOSIS — I10 Essential (primary) hypertension: Secondary | ICD-10-CM

## 2014-06-24 DIAGNOSIS — F32A Depression, unspecified: Secondary | ICD-10-CM

## 2014-06-24 DIAGNOSIS — F329 Major depressive disorder, single episode, unspecified: Secondary | ICD-10-CM

## 2014-06-25 MED ORDER — CITALOPRAM HYDROBROMIDE 40 MG PO TABS: 40.0000 mg | ORAL_TABLET | Freq: Every day | ORAL | Status: DC

## 2014-06-25 MED ORDER — METOPROLOL TARTRATE 100 MG PO TABS: ORAL_TABLET | ORAL | Status: DC

## 2014-11-25 DIAGNOSIS — I219 Acute myocardial infarction, unspecified: Secondary | ICD-10-CM

## 2014-11-25 HISTORY — DX: Acute myocardial infarction, unspecified: I21.9

## 2014-12-03 DIAGNOSIS — H60332 Swimmer's ear, left ear: Secondary | ICD-10-CM | POA: Diagnosis not present

## 2014-12-03 DIAGNOSIS — H60502 Unspecified acute noninfective otitis externa, left ear: Secondary | ICD-10-CM | POA: Diagnosis not present

## 2014-12-03 DIAGNOSIS — H9202 Otalgia, left ear: Secondary | ICD-10-CM | POA: Diagnosis not present

## 2014-12-03 DIAGNOSIS — F17200 Nicotine dependence, unspecified, uncomplicated: Secondary | ICD-10-CM | POA: Diagnosis not present

## 2014-12-03 DIAGNOSIS — R51 Headache: Secondary | ICD-10-CM | POA: Diagnosis not present

## 2014-12-03 DIAGNOSIS — Z79899 Other long term (current) drug therapy: Secondary | ICD-10-CM | POA: Diagnosis not present

## 2014-12-03 DIAGNOSIS — K219 Gastro-esophageal reflux disease without esophagitis: Secondary | ICD-10-CM | POA: Diagnosis not present

## 2014-12-19 DIAGNOSIS — R0789 Other chest pain: Secondary | ICD-10-CM | POA: Diagnosis not present

## 2014-12-20 DIAGNOSIS — R0789 Other chest pain: Secondary | ICD-10-CM | POA: Diagnosis not present

## 2014-12-20 DIAGNOSIS — Z91018 Allergy to other foods: Secondary | ICD-10-CM | POA: Diagnosis not present

## 2014-12-20 DIAGNOSIS — Z6841 Body Mass Index (BMI) 40.0 and over, adult: Secondary | ICD-10-CM | POA: Diagnosis not present

## 2014-12-20 DIAGNOSIS — Z79899 Other long term (current) drug therapy: Secondary | ICD-10-CM | POA: Diagnosis not present

## 2014-12-20 DIAGNOSIS — I10 Essential (primary) hypertension: Secondary | ICD-10-CM | POA: Diagnosis not present

## 2014-12-20 DIAGNOSIS — R079 Chest pain, unspecified: Secondary | ICD-10-CM | POA: Diagnosis not present

## 2014-12-20 DIAGNOSIS — I2129 ST elevation (STEMI) myocardial infarction involving other sites: Secondary | ICD-10-CM | POA: Diagnosis not present

## 2014-12-20 DIAGNOSIS — K219 Gastro-esophageal reflux disease without esophagitis: Secondary | ICD-10-CM | POA: Diagnosis not present

## 2014-12-20 DIAGNOSIS — H547 Unspecified visual loss: Secondary | ICD-10-CM | POA: Diagnosis not present

## 2014-12-21 DIAGNOSIS — G4733 Obstructive sleep apnea (adult) (pediatric): Secondary | ICD-10-CM | POA: Diagnosis present

## 2014-12-21 DIAGNOSIS — E119 Type 2 diabetes mellitus without complications: Secondary | ICD-10-CM | POA: Diagnosis present

## 2014-12-21 DIAGNOSIS — H547 Unspecified visual loss: Secondary | ICD-10-CM | POA: Diagnosis not present

## 2014-12-21 DIAGNOSIS — I5032 Chronic diastolic (congestive) heart failure: Secondary | ICD-10-CM | POA: Diagnosis present

## 2014-12-21 DIAGNOSIS — I2510 Atherosclerotic heart disease of native coronary artery without angina pectoris: Secondary | ICD-10-CM | POA: Diagnosis not present

## 2014-12-21 DIAGNOSIS — I10 Essential (primary) hypertension: Secondary | ICD-10-CM | POA: Diagnosis not present

## 2014-12-21 DIAGNOSIS — I519 Heart disease, unspecified: Secondary | ICD-10-CM | POA: Diagnosis not present

## 2014-12-21 DIAGNOSIS — I5030 Unspecified diastolic (congestive) heart failure: Secondary | ICD-10-CM | POA: Diagnosis not present

## 2014-12-21 DIAGNOSIS — I517 Cardiomegaly: Secondary | ICD-10-CM | POA: Diagnosis not present

## 2014-12-21 DIAGNOSIS — R079 Chest pain, unspecified: Secondary | ICD-10-CM | POA: Diagnosis not present

## 2014-12-21 DIAGNOSIS — F17200 Nicotine dependence, unspecified, uncomplicated: Secondary | ICD-10-CM | POA: Diagnosis present

## 2014-12-21 DIAGNOSIS — I213 ST elevation (STEMI) myocardial infarction of unspecified site: Secondary | ICD-10-CM | POA: Diagnosis not present

## 2014-12-21 DIAGNOSIS — Z720 Tobacco use: Secondary | ICD-10-CM | POA: Diagnosis not present

## 2014-12-21 DIAGNOSIS — K219 Gastro-esophageal reflux disease without esophagitis: Secondary | ICD-10-CM | POA: Diagnosis not present

## 2014-12-21 DIAGNOSIS — E785 Hyperlipidemia, unspecified: Secondary | ICD-10-CM | POA: Diagnosis not present

## 2014-12-21 DIAGNOSIS — I214 Non-ST elevation (NSTEMI) myocardial infarction: Secondary | ICD-10-CM | POA: Diagnosis not present

## 2014-12-21 DIAGNOSIS — I2583 Coronary atherosclerosis due to lipid rich plaque: Secondary | ICD-10-CM | POA: Diagnosis not present

## 2014-12-21 DIAGNOSIS — I2129 ST elevation (STEMI) myocardial infarction involving other sites: Secondary | ICD-10-CM | POA: Diagnosis not present

## 2014-12-21 DIAGNOSIS — Z79899 Other long term (current) drug therapy: Secondary | ICD-10-CM | POA: Diagnosis not present

## 2014-12-21 DIAGNOSIS — Z6841 Body Mass Index (BMI) 40.0 and over, adult: Secondary | ICD-10-CM | POA: Diagnosis not present

## 2014-12-21 DIAGNOSIS — I25119 Atherosclerotic heart disease of native coronary artery with unspecified angina pectoris: Secondary | ICD-10-CM | POA: Diagnosis not present

## 2014-12-21 DIAGNOSIS — E0869 Diabetes mellitus due to underlying condition with other specified complication: Secondary | ICD-10-CM | POA: Diagnosis not present

## 2014-12-21 DIAGNOSIS — Z7982 Long term (current) use of aspirin: Secondary | ICD-10-CM | POA: Diagnosis not present

## 2014-12-21 DIAGNOSIS — H548 Legal blindness, as defined in USA: Secondary | ICD-10-CM | POA: Diagnosis present

## 2014-12-22 DIAGNOSIS — I214 Non-ST elevation (NSTEMI) myocardial infarction: Secondary | ICD-10-CM | POA: Insufficient documentation

## 2015-01-24 ENCOUNTER — Other Ambulatory Visit: Payer: Self-pay | Admitting: Family Medicine

## 2015-01-27 NOTE — Telephone Encounter (Signed)
It has been over a year since patient was seen for HTN.  1 month refill sent to pharmacy.  Patient needs another appt before further refills given.  Virginia Crews, MD, MPH PGY-2,  [facility] 01/27/2015 8:29 AM

## 2015-01-28 ENCOUNTER — Encounter: Payer: Self-pay | Admitting: *Deleted

## 2015-01-28 NOTE — Telephone Encounter (Signed)
Phone number in chart not accepting incoming calls. Letter sent to patient.

## 2015-02-03 DIAGNOSIS — I2510 Atherosclerotic heart disease of native coronary artery without angina pectoris: Secondary | ICD-10-CM | POA: Insufficient documentation

## 2015-02-03 DIAGNOSIS — I5189 Other ill-defined heart diseases: Secondary | ICD-10-CM | POA: Insufficient documentation

## 2015-02-23 ENCOUNTER — Other Ambulatory Visit: Payer: Self-pay | Admitting: Family Medicine

## 2015-02-24 ENCOUNTER — Other Ambulatory Visit: Payer: Self-pay | Admitting: Family Medicine

## 2015-03-21 ENCOUNTER — Other Ambulatory Visit: Payer: Self-pay | Admitting: Family Medicine

## 2015-04-29 ENCOUNTER — Other Ambulatory Visit: Payer: Self-pay | Admitting: Family Medicine

## 2015-05-10 DIAGNOSIS — R079 Chest pain, unspecified: Secondary | ICD-10-CM | POA: Diagnosis not present

## 2015-05-10 DIAGNOSIS — Z79899 Other long term (current) drug therapy: Secondary | ICD-10-CM | POA: Diagnosis not present

## 2015-05-10 DIAGNOSIS — R0789 Other chest pain: Secondary | ICD-10-CM | POA: Diagnosis not present

## 2015-05-10 DIAGNOSIS — R200 Anesthesia of skin: Secondary | ICD-10-CM | POA: Diagnosis not present

## 2015-05-10 DIAGNOSIS — Z7982 Long term (current) use of aspirin: Secondary | ICD-10-CM | POA: Diagnosis not present

## 2015-05-10 DIAGNOSIS — M5412 Radiculopathy, cervical region: Secondary | ICD-10-CM | POA: Diagnosis not present

## 2015-05-11 DIAGNOSIS — R079 Chest pain, unspecified: Secondary | ICD-10-CM | POA: Diagnosis not present

## 2015-05-14 DIAGNOSIS — I214 Non-ST elevation (NSTEMI) myocardial infarction: Secondary | ICD-10-CM | POA: Diagnosis not present

## 2015-05-14 DIAGNOSIS — I2510 Atherosclerotic heart disease of native coronary artery without angina pectoris: Secondary | ICD-10-CM | POA: Diagnosis not present

## 2015-06-10 DIAGNOSIS — Z202 Contact with and (suspected) exposure to infections with a predominantly sexual mode of transmission: Secondary | ICD-10-CM | POA: Diagnosis not present

## 2015-06-10 DIAGNOSIS — I10 Essential (primary) hypertension: Secondary | ICD-10-CM | POA: Diagnosis not present

## 2015-07-04 DIAGNOSIS — E669 Obesity, unspecified: Secondary | ICD-10-CM | POA: Diagnosis not present

## 2015-07-04 DIAGNOSIS — H548 Legal blindness, as defined in USA: Secondary | ICD-10-CM | POA: Diagnosis not present

## 2015-07-04 DIAGNOSIS — F419 Anxiety disorder, unspecified: Secondary | ICD-10-CM | POA: Diagnosis not present

## 2015-07-04 DIAGNOSIS — K219 Gastro-esophageal reflux disease without esophagitis: Secondary | ICD-10-CM | POA: Diagnosis not present

## 2015-07-04 DIAGNOSIS — M5136 Other intervertebral disc degeneration, lumbar region: Secondary | ICD-10-CM | POA: Diagnosis not present

## 2015-07-04 DIAGNOSIS — M175 Other unilateral secondary osteoarthritis of knee: Secondary | ICD-10-CM | POA: Diagnosis not present

## 2015-07-04 DIAGNOSIS — H60333 Swimmer's ear, bilateral: Secondary | ICD-10-CM | POA: Diagnosis not present

## 2015-07-04 DIAGNOSIS — I10 Essential (primary) hypertension: Secondary | ICD-10-CM | POA: Diagnosis not present

## 2015-07-04 DIAGNOSIS — G894 Chronic pain syndrome: Secondary | ICD-10-CM | POA: Diagnosis not present

## 2015-07-04 DIAGNOSIS — I2510 Atherosclerotic heart disease of native coronary artery without angina pectoris: Secondary | ICD-10-CM | POA: Diagnosis not present

## 2015-07-04 DIAGNOSIS — E559 Vitamin D deficiency, unspecified: Secondary | ICD-10-CM | POA: Diagnosis not present

## 2015-07-04 DIAGNOSIS — E782 Mixed hyperlipidemia: Secondary | ICD-10-CM | POA: Diagnosis not present

## 2015-07-04 DIAGNOSIS — M9901 Segmental and somatic dysfunction of cervical region: Secondary | ICD-10-CM | POA: Diagnosis not present

## 2015-07-16 DIAGNOSIS — H5412 Blindness, left eye, low vision right eye: Secondary | ICD-10-CM | POA: Diagnosis not present

## 2015-07-16 DIAGNOSIS — H4720 Unspecified optic atrophy: Secondary | ICD-10-CM | POA: Diagnosis not present

## 2015-07-25 DIAGNOSIS — I252 Old myocardial infarction: Secondary | ICD-10-CM | POA: Diagnosis not present

## 2015-07-25 DIAGNOSIS — Z91018 Allergy to other foods: Secondary | ICD-10-CM | POA: Diagnosis not present

## 2015-07-25 DIAGNOSIS — E785 Hyperlipidemia, unspecified: Secondary | ICD-10-CM | POA: Diagnosis not present

## 2015-07-25 DIAGNOSIS — E6609 Other obesity due to excess calories: Secondary | ICD-10-CM | POA: Diagnosis not present

## 2015-07-25 DIAGNOSIS — Z7982 Long term (current) use of aspirin: Secondary | ICD-10-CM | POA: Diagnosis not present

## 2015-07-25 DIAGNOSIS — I2510 Atherosclerotic heart disease of native coronary artery without angina pectoris: Secondary | ICD-10-CM | POA: Diagnosis not present

## 2015-07-25 DIAGNOSIS — R079 Chest pain, unspecified: Secondary | ICD-10-CM | POA: Diagnosis not present

## 2015-07-25 DIAGNOSIS — Z6841 Body Mass Index (BMI) 40.0 and over, adult: Secondary | ICD-10-CM | POA: Diagnosis not present

## 2015-07-25 DIAGNOSIS — R0602 Shortness of breath: Secondary | ICD-10-CM | POA: Diagnosis not present

## 2015-07-25 DIAGNOSIS — Z79899 Other long term (current) drug therapy: Secondary | ICD-10-CM | POA: Diagnosis not present

## 2015-07-25 DIAGNOSIS — R0789 Other chest pain: Secondary | ICD-10-CM | POA: Diagnosis not present

## 2015-07-26 DIAGNOSIS — I208 Other forms of angina pectoris: Secondary | ICD-10-CM | POA: Diagnosis not present

## 2015-07-27 DIAGNOSIS — I252 Old myocardial infarction: Secondary | ICD-10-CM | POA: Diagnosis not present

## 2015-07-27 DIAGNOSIS — W01198A Fall on same level from slipping, tripping and stumbling with subsequent striking against other object, initial encounter: Secondary | ICD-10-CM | POA: Diagnosis not present

## 2015-07-27 DIAGNOSIS — H548 Legal blindness, as defined in USA: Secondary | ICD-10-CM | POA: Diagnosis not present

## 2015-07-27 DIAGNOSIS — R109 Unspecified abdominal pain: Secondary | ICD-10-CM | POA: Diagnosis not present

## 2015-07-27 DIAGNOSIS — S301XXA Contusion of abdominal wall, initial encounter: Secondary | ICD-10-CM | POA: Diagnosis not present

## 2015-07-27 DIAGNOSIS — K219 Gastro-esophageal reflux disease without esophagitis: Secondary | ICD-10-CM | POA: Diagnosis not present

## 2015-07-27 DIAGNOSIS — Z79899 Other long term (current) drug therapy: Secondary | ICD-10-CM | POA: Diagnosis not present

## 2015-07-27 DIAGNOSIS — I10 Essential (primary) hypertension: Secondary | ICD-10-CM | POA: Diagnosis not present

## 2015-07-27 DIAGNOSIS — R1033 Periumbilical pain: Secondary | ICD-10-CM | POA: Diagnosis not present

## 2015-07-31 DIAGNOSIS — I252 Old myocardial infarction: Secondary | ICD-10-CM | POA: Diagnosis not present

## 2015-07-31 DIAGNOSIS — I2510 Atherosclerotic heart disease of native coronary artery without angina pectoris: Secondary | ICD-10-CM | POA: Diagnosis not present

## 2015-08-21 DIAGNOSIS — M5136 Other intervertebral disc degeneration, lumbar region: Secondary | ICD-10-CM | POA: Diagnosis not present

## 2015-08-21 DIAGNOSIS — E669 Obesity, unspecified: Secondary | ICD-10-CM | POA: Diagnosis not present

## 2015-08-21 DIAGNOSIS — K219 Gastro-esophageal reflux disease without esophagitis: Secondary | ICD-10-CM | POA: Diagnosis not present

## 2015-08-21 DIAGNOSIS — E559 Vitamin D deficiency, unspecified: Secondary | ICD-10-CM | POA: Diagnosis not present

## 2015-08-21 DIAGNOSIS — I10 Essential (primary) hypertension: Secondary | ICD-10-CM | POA: Diagnosis not present

## 2015-08-23 ENCOUNTER — Other Ambulatory Visit: Payer: Self-pay | Admitting: Family Medicine

## 2015-08-25 NOTE — Telephone Encounter (Signed)
1 refill sent, but patient has not been seen in clinic since 2015.  He needs f/u appt for HTN before further refills given.  Virginia Crews, MD, MPH PGY-2,  [facility] 08/25/2015 9:16 AM

## 2015-09-24 ENCOUNTER — Other Ambulatory Visit: Payer: Self-pay | Admitting: Family Medicine

## 2015-10-07 DIAGNOSIS — H548 Legal blindness, as defined in USA: Secondary | ICD-10-CM | POA: Diagnosis not present

## 2015-10-07 DIAGNOSIS — Z79899 Other long term (current) drug therapy: Secondary | ICD-10-CM | POA: Diagnosis not present

## 2015-10-07 DIAGNOSIS — R0602 Shortness of breath: Secondary | ICD-10-CM | POA: Diagnosis not present

## 2015-10-07 DIAGNOSIS — K219 Gastro-esophageal reflux disease without esophagitis: Secondary | ICD-10-CM | POA: Diagnosis not present

## 2015-10-07 DIAGNOSIS — I10 Essential (primary) hypertension: Secondary | ICD-10-CM | POA: Diagnosis not present

## 2015-10-07 DIAGNOSIS — R079 Chest pain, unspecified: Secondary | ICD-10-CM | POA: Diagnosis not present

## 2015-10-07 DIAGNOSIS — F419 Anxiety disorder, unspecified: Secondary | ICD-10-CM | POA: Diagnosis not present

## 2015-10-07 DIAGNOSIS — I252 Old myocardial infarction: Secondary | ICD-10-CM | POA: Diagnosis not present

## 2015-10-19 IMAGING — CR DG CHEST 2V
2 series · 2 of 2 positions shown · non-contrast
Comparison: DG CHEST 2 VIEW dated 05/14/2013

CLINICAL DATA: Cough and chest pain for 1 day.  Headache.

EXAM:
CHEST  2 VIEW

[view not recorded (1 of 2)]
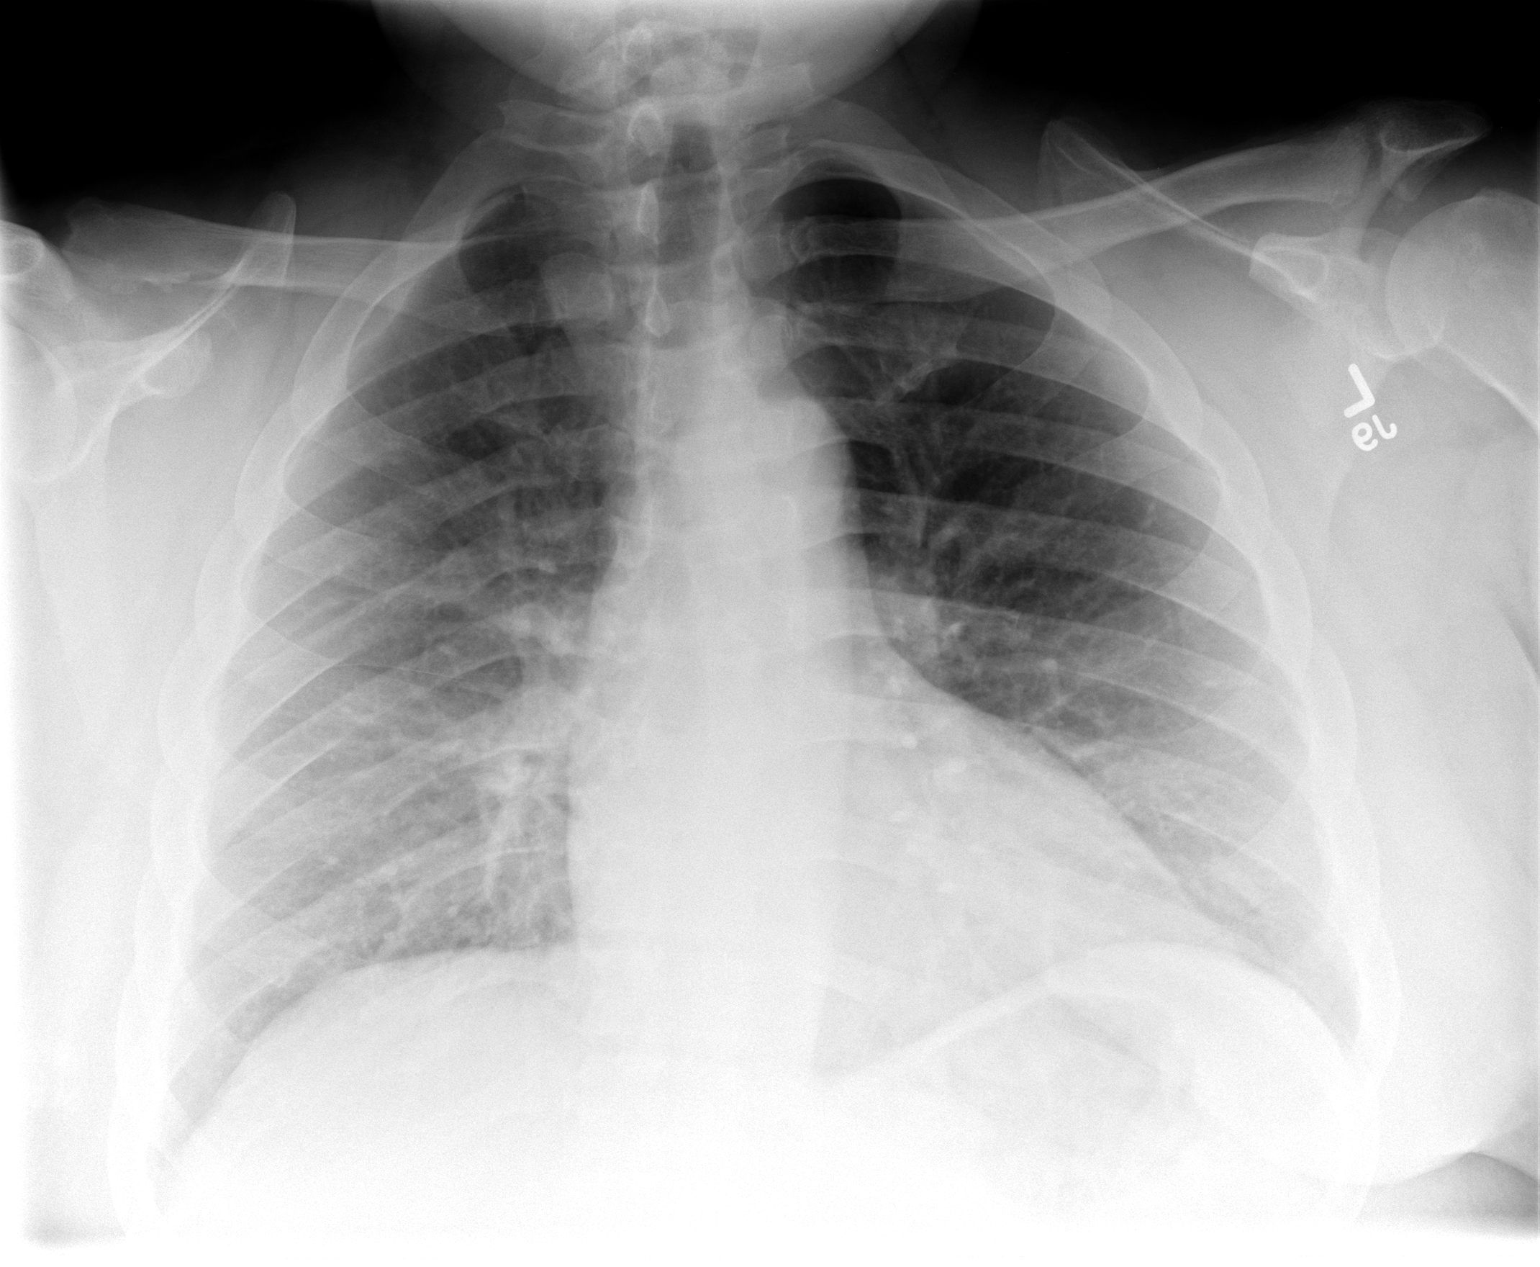

[view not recorded (2 of 2)]
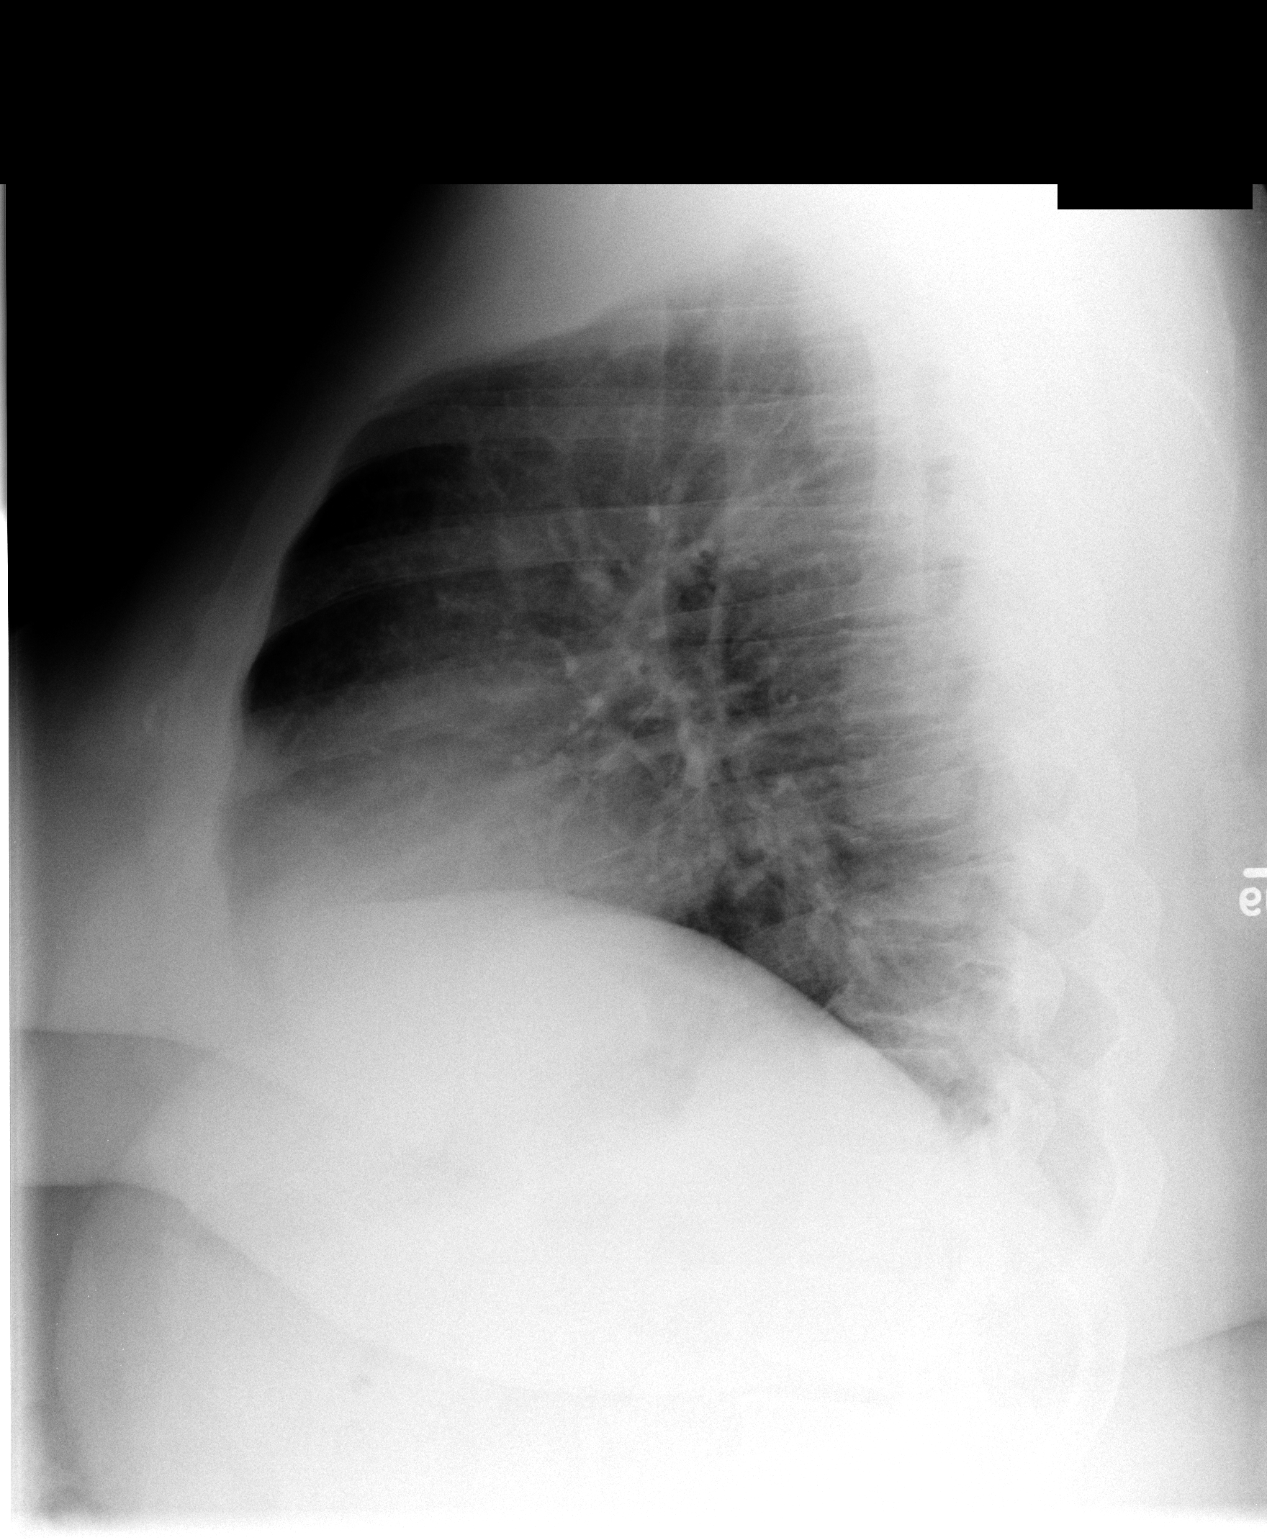

[2 of 2 positions shown; findings below may reference images not displayed]

FINDINGS: Midline trachea. Normal heart size and mediastinal contours. No
pleural effusion or pneumothorax. Clear lungs.
IMPRESSION: No acute cardiopulmonary disease.

## 2015-10-23 DIAGNOSIS — M654 Radial styloid tenosynovitis [de Quervain]: Secondary | ICD-10-CM | POA: Diagnosis not present

## 2015-10-23 DIAGNOSIS — M5136 Other intervertebral disc degeneration, lumbar region: Secondary | ICD-10-CM | POA: Diagnosis not present

## 2015-10-23 DIAGNOSIS — H548 Legal blindness, as defined in USA: Secondary | ICD-10-CM | POA: Diagnosis not present

## 2015-10-23 DIAGNOSIS — K047 Periapical abscess without sinus: Secondary | ICD-10-CM | POA: Diagnosis not present

## 2015-10-27 ENCOUNTER — Other Ambulatory Visit: Payer: Self-pay | Admitting: *Deleted

## 2015-10-27 MED ORDER — HYDROCHLOROTHIAZIDE 25 MG PO TABS: ORAL_TABLET | ORAL | Status: DC

## 2015-11-16 DIAGNOSIS — H548 Legal blindness, as defined in USA: Secondary | ICD-10-CM | POA: Diagnosis not present

## 2015-11-16 DIAGNOSIS — N433 Hydrocele, unspecified: Secondary | ICD-10-CM | POA: Diagnosis not present

## 2015-11-16 DIAGNOSIS — N50811 Right testicular pain: Secondary | ICD-10-CM | POA: Diagnosis not present

## 2015-11-16 DIAGNOSIS — Z79899 Other long term (current) drug therapy: Secondary | ICD-10-CM | POA: Diagnosis not present

## 2015-11-16 DIAGNOSIS — K219 Gastro-esophageal reflux disease without esophagitis: Secondary | ICD-10-CM | POA: Diagnosis not present

## 2015-11-16 DIAGNOSIS — I252 Old myocardial infarction: Secondary | ICD-10-CM | POA: Diagnosis not present

## 2015-11-16 DIAGNOSIS — I10 Essential (primary) hypertension: Secondary | ICD-10-CM | POA: Diagnosis not present

## 2015-11-16 DIAGNOSIS — N451 Epididymitis: Secondary | ICD-10-CM | POA: Diagnosis not present

## 2015-11-18 DIAGNOSIS — M654 Radial styloid tenosynovitis [de Quervain]: Secondary | ICD-10-CM | POA: Insufficient documentation

## 2015-11-18 DIAGNOSIS — M79642 Pain in left hand: Secondary | ICD-10-CM | POA: Diagnosis not present

## 2015-11-18 DIAGNOSIS — M79641 Pain in right hand: Secondary | ICD-10-CM | POA: Diagnosis not present

## 2015-11-18 DIAGNOSIS — M65332 Trigger finger, left middle finger: Secondary | ICD-10-CM | POA: Diagnosis not present

## 2015-11-18 DIAGNOSIS — M65331 Trigger finger, right middle finger: Secondary | ICD-10-CM | POA: Diagnosis not present

## 2015-11-19 DIAGNOSIS — N451 Epididymitis: Secondary | ICD-10-CM | POA: Diagnosis not present

## 2015-11-19 DIAGNOSIS — K219 Gastro-esophageal reflux disease without esophagitis: Secondary | ICD-10-CM | POA: Diagnosis not present

## 2015-11-19 DIAGNOSIS — M5136 Other intervertebral disc degeneration, lumbar region: Secondary | ICD-10-CM | POA: Diagnosis not present

## 2015-11-19 DIAGNOSIS — E559 Vitamin D deficiency, unspecified: Secondary | ICD-10-CM | POA: Diagnosis not present

## 2015-11-19 DIAGNOSIS — H548 Legal blindness, as defined in USA: Secondary | ICD-10-CM | POA: Diagnosis not present

## 2015-11-26 DIAGNOSIS — M65331 Trigger finger, right middle finger: Secondary | ICD-10-CM | POA: Diagnosis not present

## 2015-11-26 DIAGNOSIS — M654 Radial styloid tenosynovitis [de Quervain]: Secondary | ICD-10-CM | POA: Diagnosis not present

## 2015-11-26 DIAGNOSIS — M65332 Trigger finger, left middle finger: Secondary | ICD-10-CM | POA: Diagnosis not present

## 2015-12-22 DIAGNOSIS — E559 Vitamin D deficiency, unspecified: Secondary | ICD-10-CM | POA: Diagnosis not present

## 2015-12-22 DIAGNOSIS — H548 Legal blindness, as defined in USA: Secondary | ICD-10-CM | POA: Diagnosis not present

## 2015-12-22 DIAGNOSIS — M5136 Other intervertebral disc degeneration, lumbar region: Secondary | ICD-10-CM | POA: Diagnosis not present

## 2015-12-22 DIAGNOSIS — K219 Gastro-esophageal reflux disease without esophagitis: Secondary | ICD-10-CM | POA: Diagnosis not present

## 2015-12-22 DIAGNOSIS — I10 Essential (primary) hypertension: Secondary | ICD-10-CM | POA: Diagnosis not present

## 2016-01-27 DIAGNOSIS — M65311 Trigger thumb, right thumb: Secondary | ICD-10-CM | POA: Insufficient documentation

## 2016-03-02 DIAGNOSIS — M65311 Trigger thumb, right thumb: Secondary | ICD-10-CM | POA: Diagnosis not present

## 2016-03-02 DIAGNOSIS — M654 Radial styloid tenosynovitis [de Quervain]: Secondary | ICD-10-CM | POA: Diagnosis not present

## 2016-03-02 DIAGNOSIS — M65331 Trigger finger, right middle finger: Secondary | ICD-10-CM | POA: Diagnosis not present

## 2016-03-02 DIAGNOSIS — M79642 Pain in left hand: Secondary | ICD-10-CM | POA: Diagnosis not present

## 2016-03-02 DIAGNOSIS — M65332 Trigger finger, left middle finger: Secondary | ICD-10-CM | POA: Diagnosis not present

## 2016-03-02 DIAGNOSIS — M79641 Pain in right hand: Secondary | ICD-10-CM | POA: Diagnosis not present

## 2016-03-12 ENCOUNTER — Telehealth: Payer: Self-pay | Admitting: Family Medicine

## 2016-03-12 NOTE — Telephone Encounter (Signed)
The home number [phone] no longer belongs to pt.

## 2016-03-23 DIAGNOSIS — H548 Legal blindness, as defined in USA: Secondary | ICD-10-CM | POA: Diagnosis not present

## 2016-03-23 DIAGNOSIS — I2510 Atherosclerotic heart disease of native coronary artery without angina pectoris: Secondary | ICD-10-CM | POA: Diagnosis not present

## 2016-03-23 DIAGNOSIS — E669 Obesity, unspecified: Secondary | ICD-10-CM | POA: Diagnosis not present

## 2016-03-23 DIAGNOSIS — M5136 Other intervertebral disc degeneration, lumbar region: Secondary | ICD-10-CM | POA: Diagnosis not present

## 2016-03-23 DIAGNOSIS — K219 Gastro-esophageal reflux disease without esophagitis: Secondary | ICD-10-CM | POA: Diagnosis not present

## 2016-03-23 DIAGNOSIS — I10 Essential (primary) hypertension: Secondary | ICD-10-CM | POA: Diagnosis not present

## 2016-03-23 DIAGNOSIS — E782 Mixed hyperlipidemia: Secondary | ICD-10-CM | POA: Diagnosis not present

## 2016-03-23 DIAGNOSIS — E559 Vitamin D deficiency, unspecified: Secondary | ICD-10-CM | POA: Diagnosis not present

## 2016-04-27 ENCOUNTER — Encounter: Payer: Self-pay | Admitting: *Deleted

## 2016-04-28 ENCOUNTER — Encounter: Payer: Self-pay | Admitting: Cardiovascular Disease

## 2016-04-28 ENCOUNTER — Ambulatory Visit (INDEPENDENT_AMBULATORY_CARE_PROVIDER_SITE_OTHER): Payer: Medicare Other | Admitting: Cardiovascular Disease

## 2016-04-28 ENCOUNTER — Encounter: Payer: Self-pay | Admitting: *Deleted

## 2016-04-28 VITALS — BP 140/100 | HR 96 | Ht 70.0 in | Wt 302.0 lb

## 2016-04-28 DIAGNOSIS — I25118 Atherosclerotic heart disease of native coronary artery with other forms of angina pectoris: Secondary | ICD-10-CM | POA: Diagnosis not present

## 2016-04-28 DIAGNOSIS — I252 Old myocardial infarction: Secondary | ICD-10-CM | POA: Diagnosis not present

## 2016-04-28 DIAGNOSIS — I10 Essential (primary) hypertension: Secondary | ICD-10-CM

## 2016-04-28 DIAGNOSIS — R0609 Other forms of dyspnea: Secondary | ICD-10-CM | POA: Diagnosis not present

## 2016-04-28 DIAGNOSIS — I5032 Chronic diastolic (congestive) heart failure: Secondary | ICD-10-CM

## 2016-04-28 DIAGNOSIS — R0600 Dyspnea, unspecified: Secondary | ICD-10-CM

## 2016-04-28 DIAGNOSIS — E7800 Pure hypercholesterolemia, unspecified: Secondary | ICD-10-CM

## 2016-04-28 MED ORDER — NITROGLYCERIN 0.4 MG SL SUBL: 0.4000 mg | SUBLINGUAL_TABLET | SUBLINGUAL | 3 refills | Status: DC | PRN

## 2016-04-28 NOTE — Patient Instructions (Signed)
Medication Instructions:   Nitroglycerin refilled today.  Continue all other current medications.  Labwork: none  Testing/Procedures: none  Follow-Up: Your physician wants you to follow up in: 6 months.  You will receive a reminder letter in the mail one-two months in advance.  If you don't receive a letter, please call our office to schedule the follow up appointment   Any Other Special Instructions Will Be Listed Below (If Applicable).  If you need a refill on your cardiac medications before your next appointment, please call your pharmacy.
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
## 2016-07-21 ENCOUNTER — Telehealth: Payer: Self-pay | Admitting: Cardiovascular Disease

## 2016-07-21 NOTE — Telephone Encounter (Signed)
Dr. Ronne Binning contacted me about this patient who is hospitalized at [facility]. He had chest pain with a history of coronary artery disease and prior non-STEMI. Coronary angiography was performed in 2016 but a guidewire could not be advanced. He ruled out for an acute coronary syndrome. Nuclear stress test was performed which poorly demonstrated a moderate amount of anterolateral ischemia. I have asked my staff to put him on my schedule to be evaluated tomorrow. He will likely need coronary angiography. I asked Dr. Ronne Binning to increase Imdur to 60 mg daily.

## 2016-07-22 ENCOUNTER — Encounter: Payer: Self-pay | Admitting: Cardiovascular Disease

## 2016-07-22 ENCOUNTER — Ambulatory Visit (INDEPENDENT_AMBULATORY_CARE_PROVIDER_SITE_OTHER): Payer: Medicare Other | Admitting: Cardiovascular Disease

## 2016-07-22 VITALS — BP 122/98 | HR 85 | Ht 70.0 in | Wt 354.0 lb

## 2016-07-22 DIAGNOSIS — Z9289 Personal history of other medical treatment: Secondary | ICD-10-CM | POA: Diagnosis not present

## 2016-07-22 DIAGNOSIS — E7800 Pure hypercholesterolemia, unspecified: Secondary | ICD-10-CM

## 2016-07-22 DIAGNOSIS — I252 Old myocardial infarction: Secondary | ICD-10-CM

## 2016-07-22 DIAGNOSIS — I10 Essential (primary) hypertension: Secondary | ICD-10-CM

## 2016-07-22 DIAGNOSIS — R079 Chest pain, unspecified: Secondary | ICD-10-CM

## 2016-07-22 DIAGNOSIS — R1310 Dysphagia, unspecified: Secondary | ICD-10-CM | POA: Diagnosis not present

## 2016-07-22 DIAGNOSIS — R0600 Dyspnea, unspecified: Secondary | ICD-10-CM

## 2016-07-22 DIAGNOSIS — T17308A Unspecified foreign body in larynx causing other injury, initial encounter: Secondary | ICD-10-CM | POA: Diagnosis not present

## 2016-07-22 DIAGNOSIS — I25118 Atherosclerotic heart disease of native coronary artery with other forms of angina pectoris: Secondary | ICD-10-CM | POA: Diagnosis not present

## 2016-07-22 DIAGNOSIS — R0609 Other forms of dyspnea: Secondary | ICD-10-CM | POA: Diagnosis not present

## 2016-07-22 MED ORDER — ISOSORBIDE MONONITRATE ER 60 MG PO TB24: 60.0000 mg | ORAL_TABLET | Freq: Every day | ORAL | 3 refills | Status: DC

## 2016-07-22 NOTE — Progress Notes (Signed)
SUBJECTIVE: The patient presents for follow-up of coronary artery disease and chest pain. Dr. Ronne Binning contacted me yesterday about this patient who was hospitalized at [facility]. I reviewed all relevant documentation, labs, and studies pertaining to this.  He has a prior history of non-STEMI. Coronary angiography was performed in 2016 but a guidewire could not be advanced across an ostial diagonal lesion at [facility]. He ruled out for an acute coronary syndrome with serial normal troponins.   He just underwent a nuclear stress test which demonstrated a moderate amount of ischemia in the anterolateral myocardium, LVEF 58%. I reviewed the report. I recommended Imdur be increased to 60 mg daily.  I told Dr. Ronne Binning that the patient may require coronary angiography.  I personally reviewed the ECG performed on 07/20/16 which demonstrated normal sinus rhythm, 89 bpm. There were no ischemic ST segment or T-wave abnormalities.  Upon speaking with the patient and his wife, his chest pain developed after eating food which got stuck in his esophagus. He tells me that when he eats steak, he has dysphagia and he has to go make himself vomit. Otherwise, the food will not pass into his stomach.   When he went to the emergency room, his chest discomfort was alleviated with a GI cocktail. He currently takes omeprazole and had been on Protonix in the past. He has never seen gastroenterology. The pain lasted for about an hour before he went to the emergency room.  When he walks up his driveway to take out the trash, he experiences some exertional dyspnea but this has not gotten any worse. He denies exertional chest pain. Has a headache today. Took Imdur at the increased dose of 60 mg for the first time this morning.   Review of Systems: As per "subjective", otherwise negative.  Allergies  Allergen Reactions  . Other Anaphylaxis    [place] nuts    Current Outpatient Prescriptions    Medication Sig Dispense Refill  . amLODipine (NORVASC) 10 MG tablet TAKE 1 TABLET (10 MG TOTAL) BY MOUTH DAILY. 30 tablet 0  . aspirin EC 81 MG tablet Take 81 mg by mouth daily.    Marland Kitchen atorvastatin (LIPITOR) 80 MG tablet Take 80 mg by mouth at bedtime.  2  . docusate sodium (COLACE) 100 MG capsule Take 100 mg by mouth 2 (two) times daily.    . hydrochlorothiazide (HYDRODIURIL) 25 MG tablet TAKE 1 TABLET (25 MG TOTAL) BY MOUTH DAILY. (Patient taking differently: Take 25 mg by mouth. TAKE 1 TABLET (25 MG TOTAL) BY MOUTH three x week) 30 tablet 0  . isosorbide mononitrate (IMDUR) 60 MG 24 hr tablet Take 60 mg by mouth daily.    Marland Kitchen KLOR-CON M20 20 MEQ tablet Take 20 mEq by mouth daily. with food  11  . meloxicam (MOBIC) 15 MG tablet Take 1 tablet (15 mg total) by mouth daily. 30 tablet 3  . metoprolol (LOPRESSOR) 100 MG tablet TAKE 1 TABLET (100 MG TOTAL) BY MOUTH 2 (TWO) TIMES DAILY. 60 tablet 6  . nitroGLYCERIN (NITROSTAT) 0.4 MG SL tablet Place 1 tablet (0.4 mg total) under the tongue every 5 (five) minutes as needed for chest pain. 25 tablet 3  . omeprazole (PRILOSEC) 40 MG capsule Take 1 capsule (40 mg total) by mouth daily. 30 capsule 6  . oxyCODONE-acetaminophen (PERCOCET) 10-325 MG tablet TAKE 1 TABLET BY MOUTH EVERY SIX HOURS **30 DAYS**  0  . tiZANidine (ZANAFLEX) 4 MG capsule Take 1 capsule (4  mg total) by mouth 3 (three) times daily as needed for muscle spasms. 30 capsule 3  . torsemide (DEMADEX) 20 MG tablet TAKE 1 TABLET (20 MG TOTAL) BY MOUTH DAILY AS NEEDED (SWELLING). 30 tablet 3   No current facility-administered medications for this visit.     Past Medical History:  Diagnosis Date  . Carpal tunnel syndrome   . Condylomata acuminata in male   . Coronary artery disease   . GERD (gastroesophageal reflux disease)   . Gout   . Hyperlipidemia   . Hypertension   . Knee pain, bilateral   . Legal blindness   . Lumbar degenerative disc disease   . Obesity   . OSA (obstructive  sleep apnea)   . Seizures ([place])   . Tobacco abuse   . Vitamin D deficiency     Past Surgical History:  Procedure Laterality Date  . CORONARY ANGIOPLASTY  2016  . 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     Social History   Social History  . Marital status: Married    Spouse name: N/A  . Number of children: N/A  . Years of education: N/A   Occupational History  . shipping and recieving     employer: [employer]    Social History Main Topics  . Smoking status: Former Smoker    Packs/day: 0.50    Types: Cigarettes    Quit date: 04/28/2014  . Smokeless tobacco: Never Used     Comment: decreased smoking  . Alcohol use No  . Drug use: No  . Sexual activity: Not on file   Other Topics Concern  . Not on file   Social History Narrative   Pt currently lives with brother and sister in law      Vitals:   07/22/16 1513  BP: (!) 122/98  Pulse: 85  SpO2: 95%  Weight: (!) 354 lb (160.6 kg)  Height: 5\' 10"  (1.778 m)    PHYSICAL EXAM General: NAD HEENT: Blind. Neck: No JVD, no thyromegaly. Lungs: Clear to auscultation bilaterally with normal respiratory effort. CV: Nondisplaced PMI.  Regular rate and rhythm, normal S1/S2, no S3/S4, no murmur. No pretibial or periankle edema.   Abdomen: Firm, obese.  Neurologic: Alert and oriented.  Psych: Normal affect. Skin: Normal. Musculoskeletal: No gross deformities.    ECG: Most recent ECG reviewed.      ASSESSMENT AND PLAN: 1. Chest pain in the context of coronary artery disease with prior history of non-STEMI and ostial diagonal lesion with an abnormal nuclear stress test: Overall he is symptomatically stable. At this time, I will not proceed with coronary angiography as some of his symptoms clearly appear to be gastrointestinal in etiology. I will continue aspirin, Lipitor, metoprolol, and long-acting nitrates at the increased dose of 60 mg daily.  2. Chronic  diastolic heart failure: Euvolemic. No changes to diuretic regimen.  3. Hypertension: Elevated diastolic blood pressure today. I will monitor this.  4. Hyperlipidemia: Continue Lipitor.  5. Dysphagia for solids with associated choking and vomiting: He is currently on omeprazole and had taken Protonix in the past. He may have a stricture which would need dilatation. I will make a referral to gastroenterology as he likely requires an EGD.  Dispo:  Fu 3 months.  Time spent: 40 minutes, of which greater than 50% was spent reviewing symptoms, relevant blood tests and studies, and discussing management plan with the patient.  Kate Sable, M.D., F.A.C.C.

## 2016-07-22 NOTE — Patient Instructions (Signed)
Medication Instructions:  Continue all current medications.  Labwork: none  Testing/Procedures: none  Referrals:   Gastroenterology - Dr. Barney Drain - [facility]   Follow-Up: 3 months   Any Other Special Instructions Will Be Listed Below (If Applicable).  If you need a refill on your cardiac medications before your next appointment, please call your pharmacy.

## 2016-08-02 ENCOUNTER — Encounter: Payer: Self-pay | Admitting: Gastroenterology

## 2016-09-16 ENCOUNTER — Ambulatory Visit (INDEPENDENT_AMBULATORY_CARE_PROVIDER_SITE_OTHER): Payer: Medicare Other | Admitting: Gastroenterology

## 2016-09-16 ENCOUNTER — Encounter: Payer: Self-pay | Admitting: Gastroenterology

## 2016-09-16 ENCOUNTER — Other Ambulatory Visit: Payer: Self-pay

## 2016-09-16 DIAGNOSIS — R1310 Dysphagia, unspecified: Secondary | ICD-10-CM | POA: Insufficient documentation

## 2016-09-16 DIAGNOSIS — R1013 Epigastric pain: Secondary | ICD-10-CM

## 2016-09-16 DIAGNOSIS — K219 Gastro-esophageal reflux disease without esophagitis: Secondary | ICD-10-CM | POA: Diagnosis not present

## 2016-09-16 DIAGNOSIS — I25118 Atherosclerotic heart disease of native coronary artery with other forms of angina pectoris: Secondary | ICD-10-CM | POA: Diagnosis not present

## 2016-09-16 NOTE — Patient Instructions (Signed)
DRINK WATER TO KEEP YOUR URINE LIGHT YELLOW.  CONTINUE YOUR WEIGHT LOSS EFFORTS. YOU SHOULD TRANSITION TO A PLANT BASED DIET-NO MEAT OR DAIRY FOR 6 MOS. AVOID ITEMS THAT CAUSE BLOATING & GAS. I RECOMMEND THE BOOK, "PREVENT AND REVERSE HEART DISEASE". CALDWELL ESSELSTYN JR., MD. PAGE 120-121 CLEARLY STATE THE RULES AND QUICK AND EASY RECIPES FOR BREAKFAST, LUNCH, AND DINNER ARE AFTER P 127.  AVOID REFLUX TRIGGERS. SEE INFO BELOW.  CONTINUE OMEPRAZOLE.  TAKE 30 MINUTES PRIOR TO YOUR FIRST MEAL.  UPPER ENDOSCOPY TO STRETCH YOUR ESOPHAGUS IN 2-3 WEEKS.   FOLLOW UP IN 4 MOS.      Lifestyle and home remedies TO MANAGE REFLUX/CHEST PAIN/REGURGITATION  You may eliminate or reduce the frequency of heartburn by making the following lifestyle changes:  . Control your weight. Being overweight is a major risk factor for heartburn and GERD. Excess pounds put pressure on your abdomen, pushing up your stomach and causing acid to back up into your esophagus.   . Eat smaller meals. 4 TO 6 MEALS A DAY. This reduces pressure on the lower esophageal sphincter, helping to prevent the valve from opening and acid from washing back into your esophagus.   Dolphus Jenny your belt. Clothes that fit tightly around your waist put pressure on your abdomen and the lower esophageal sphincter.   . Eliminate heartburn triggers. Everyone has specific triggers. Common triggers such as fatty or fried foods, spicy food, tomato sauce, carbonated beverages, alcohol, chocolate, mint, garlic, onion, caffeine and nicotine may make heartburn worse.   Marland Kitchen Avoid stooping or bending. Tying your shoes is OK. Bending over for longer periods to weed your garden isn't, especially soon after eating.   . Don't lie down after a meal. Wait at least three to four hours after eating before going to bed, and don't lie down right after eating.    Alternative medicine . Several home remedies exist for treating GERD, but they provide only temporary  relief. They include drinking baking soda (sodium bicarbonate) added to water or drinking other fluids such as baking soda mixed with cream of tartar and water.  . Although these liquids create temporary relief by neutralizing, washing away or buffering acids, eventually they aggravate the situation by adding gas and fluid to your stomach, increasing pressure and causing more acid reflux. Further, adding more sodium to your diet may increase your blood pressure and add stress to your heart, and excessive bicarbonate ingestion can alter the acid-base balance in your body.

## 2016-09-16 NOTE — Progress Notes (Signed)
Subjective:    Patient ID: Mike Davenport, male    DOB: January 01, 1971, 46 y.o.   MRN: [id number]  Octavio Graves, DO  HPI NOTICED SOMETIMES FOOD SITS INHIS CHEST AND HE CAN BUR[P AND SOLID PIECES OF FOOD COMES BACK UP WHEN HE BURPS. [place] DUE TO CHEST PAIN. OFF & ON FOR A COUPLE OF YEARS BUT NOW SEEMS WORSE. SYMPTOMS ONCE A WEEK. IF GETS REAL BAD MAKES HIMSELF THROWS UP. BMs: PRETTY GOOD. USES CPAP. PAIN IN ABDOMEN IF DIVERTICULITIS(X2). BEEN LOSING WEIGHT.   PT DENIES FEVER, CHILLS, HEMATOCHEZIA, HEMATEMESIS, nausea, melena, diarrhea, SHORTNESS OF BREATH,  CHANGE IN BOWEL IN HABITS, abdominal pain, problems with sedation, OR heartburn or indigestion.  Past Medical History:  Diagnosis Date  . Carpal tunnel syndrome   . Condylomata acuminata in male   . Coronary artery disease   . GERD (gastroesophageal reflux disease)   . Gout   . Hyperlipidemia   . Hypertension   . Knee pain, bilateral   . Legal blindness   . Lumbar degenerative disc disease   . Obesity   . OSA (obstructive sleep apnea)   . Seizures ([place])   . Tobacco abuse   . Vitamin D deficiency    Past Surgical History:  Procedure Laterality Date  . CORONARY ANGIOPLASTY  2016  . 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    Allergies  Allergen Reactions  . Other Anaphylaxis    [place] nuts   Current Outpatient Prescriptions  Medication Sig Dispense Refill  . amLODipine (NORVASC) 10 MG tablet TAKE 1 TABLET (10 MG TOTAL) BY MOUTH DAILY.    Marland Kitchen aspirin EC 81 MG tablet Take 81 mg by mouth daily.    Marland Kitchen atorvastatin (LIPITOR) 80 MG tablet Take 80 mg by mouth at bedtime.    . hydrochlorothiazide (HYDRODIURIL) 25 MG tablet TAKE 1 TABLET (25 MG TOTAL) BY MOUTH DAILY.  3X/WEEK   . isosorbide mononitrate (IMDUR) 60 MG 24 hr tablet Take 1 tablet (60 mg total) by mouth daily.    Marland Kitchen KLOR-CON M20 20 MEQ tablet Take 20 mEq by mouth. with food. Takes  twice/day every other day    . metoprolol (LOPRESSOR) 100 MG tablet TAKE 1 TABLET (100 MG TOTAL) BY MOUTH 2 (TWO) TIMES DAILY.    . nitroGLYCERIN (NITROSTAT) 0.4 MG SL tablet Place 1 tablet (0.4 mg total) under the tongue every 5 (five) minutes as needed for chest pain.    Marland Kitchen omeprazole (PRILOSEC) 40 MG capsule Take 1 capsule (40 mg total) by mouth daily.    Marland Kitchen oxyCODONE-acetaminophen (PERCOCET) 10-325 MG tablet TAKE 1 TABLET BY MOUTH EVERY SIX HOURS **30 DAYS**.    Marland Kitchen torsemide (DEMADEX) 20 MG tablet TAKE 1 TABLET (20 MG TOTAL) BY MOUTH DAILY AS NEEDED (SWELLING).    Marland Kitchen      .      .       Family History  Problem Relation Age of Onset  . High blood pressure Mother   . Cancer Maternal Grandmother    Social History   Social History  . Marital status: Married    Spouse name: N/A  . Number of children: N/A  . Years of education: N/A   Occupational History  . shipping and recieving     employer: [employer]    Social History Main Topics  . Smoking status: Former Smoker    Packs/day: 0.50  Types: Cigarettes    Quit date: 04/28/2014  . Smokeless tobacco: Never Used     Comment: decreased smoking  . Alcohol use No  . Drug use: No  . Sexual activity: Not Asked   Other Topics Concern  . None   Social History Narrative   Pt currently lives with brother and sister in law    Review of Systems PER HPI OTHERWISE ALL SYSTEMS ARE NEGATIVE.    Objective:   Physical Exam  Constitutional: He is oriented to person, place, and time. He appears well-developed and well-nourished. No distress.  HENT:  Head: Normocephalic and atraumatic.  Mouth/Throat: Oropharynx is clear and moist. No oropharyngeal exudate.  Eyes: Pupils are equal, round, and reactive to light. No scleral icterus.  Neck: Normal range of motion. Neck supple.  Cardiovascular: Normal rate, regular rhythm and normal heart sounds.   Pulmonary/Chest: Effort normal and breath sounds normal. No respiratory distress.  Abdominal: Soft.  Bowel sounds are normal. He exhibits no distension. There is no tenderness.  Musculoskeletal: He exhibits no edema.  Lymphadenopathy:    He has no cervical adenopathy.  Neurological: He is alert and oriented to person, place, and time.  PT IS BLIND  Psychiatric: He has a normal mood and affect.  Vitals reviewed.     Assessment & Plan:

## 2016-09-16 NOTE — Assessment & Plan Note (Signed)
SYMPTOMS FAIRLY WELL CONTROLLED.  DRINK WATER CONTINUE WEIGHT LOSS EFFORTS. AVOID REFLUX TRIGGERS.  HANDOUT GIVEN. CONTINUE OMEPRAZOLE.  TAKE 30 MINUTES PRIOR TO YOUR FIRST MEAL. FOLLOW UP IN 4 MOS.

## 2016-09-16 NOTE — Patient Instructions (Signed)
PER [facility] ok to take all morning medications on the the day of EGD.

## 2016-09-16 NOTE — Progress Notes (Signed)
cc'ed to pcp °

## 2016-09-16 NOTE — Progress Notes (Signed)
ON RECALL  °

## 2016-09-16 NOTE — Progress Notes (Signed)
REVIEWED. AGREE. NO ADDITIONAL RECOMMENDATIONS. 

## 2016-09-16 NOTE — Assessment & Plan Note (Addendum)
SYMPTOMS NOT CONTROLLED AND MOST LIKELY DUE TO PEPTIC STRICTURE HIATAL HERNIA. DIFFERENTIAL DIAGNOSIS INCLUDES: H PYLORI GASTRITIS, LESS LIKELY PUD, OR ESOPAHGEAL CANCER.  DRINK WATER TO KEEP YOUR URINE LIGHT YELLOW. CONTINUE YOUR WEIGHT LOSS EFFORTS. YOU SHOULD TRANSITION TO A PLANT BASED DIET-NO MEAT OR DAIRY FOR 6 MOS. AVOID ITEMS THAT CAUSE BLOATING & GAS. I RECOMMEND THE BOOK, "PREVENT AND REVERSE HEART DISEASE". CALDWELL ESSELSTYN JR., MD. PAGE 120-121 CLEARLY STATE THE RULES AND QUICK AND EASY RECIPES FOR BREAKFAST, LUNCH, AND DINNER ARE AFTER P 127. UPPER ENDOSCOPY TO STRETCH YOUR ESOPHAGUS IN 2-3 WEEKS. NEEDS CPAP. DISCUSSED PROCEDURE, BENEFITS, & RISKS: < 1% chance of medication reaction, bleeding, OR perforation.  FOLLOW UP IN 4 MOS.

## 2016-09-24 ENCOUNTER — Telehealth: Payer: Self-pay

## 2016-09-24 NOTE — Telephone Encounter (Signed)
Endo Scheduler called and requested if pt could arrive earlier 09/27/16 for EGD/ED. Called and spoke to pt's wife. He will arrive at 9:15am for 10:15am procedure. Advised wife for him to start clear liquids after 6:00pm 09/26/16 and NPO after midnight. Called and informed Endo Scheduler that he will arrive at 9:15am.

## 2016-09-27 ENCOUNTER — Ambulatory Visit (HOSPITAL_COMMUNITY)
Admission: RE | Admit: 2016-09-27 | Discharge: 2016-09-27 | Disposition: A | Discharge status: Home / Self Care | Payer: Medicare Other | Source: Ambulatory Visit | Attending: Gastroenterology | Admitting: Gastroenterology

## 2016-09-27 ENCOUNTER — Encounter (HOSPITAL_COMMUNITY): Payer: Self-pay

## 2016-09-27 ENCOUNTER — Encounter (HOSPITAL_COMMUNITY)
Admission: RE | Disposition: A | Discharge status: Home / Self Care | Payer: Self-pay | Source: Ambulatory Visit | Attending: Gastroenterology

## 2016-09-27 DIAGNOSIS — K317 Polyp of stomach and duodenum: Secondary | ICD-10-CM | POA: Diagnosis not present

## 2016-09-27 DIAGNOSIS — Z8719 Personal history of other diseases of the digestive system: Secondary | ICD-10-CM | POA: Diagnosis not present

## 2016-09-27 DIAGNOSIS — Z91018 Allergy to other foods: Secondary | ICD-10-CM | POA: Insufficient documentation

## 2016-09-27 DIAGNOSIS — M109 Gout, unspecified: Secondary | ICD-10-CM | POA: Insufficient documentation

## 2016-09-27 DIAGNOSIS — Z79899 Other long term (current) drug therapy: Secondary | ICD-10-CM | POA: Insufficient documentation

## 2016-09-27 DIAGNOSIS — K2950 Unspecified chronic gastritis without bleeding: Secondary | ICD-10-CM | POA: Diagnosis not present

## 2016-09-27 DIAGNOSIS — Z87891 Personal history of nicotine dependence: Secondary | ICD-10-CM | POA: Diagnosis not present

## 2016-09-27 DIAGNOSIS — R1013 Epigastric pain: Secondary | ICD-10-CM

## 2016-09-27 DIAGNOSIS — E785 Hyperlipidemia, unspecified: Secondary | ICD-10-CM | POA: Diagnosis not present

## 2016-09-27 DIAGNOSIS — K2970 Gastritis, unspecified, without bleeding: Secondary | ICD-10-CM | POA: Diagnosis not present

## 2016-09-27 DIAGNOSIS — K219 Gastro-esophageal reflux disease without esophagitis: Secondary | ICD-10-CM | POA: Insufficient documentation

## 2016-09-27 DIAGNOSIS — I2510 Atherosclerotic heart disease of native coronary artery without angina pectoris: Secondary | ICD-10-CM | POA: Insufficient documentation

## 2016-09-27 DIAGNOSIS — R1310 Dysphagia, unspecified: Secondary | ICD-10-CM

## 2016-09-27 DIAGNOSIS — Z7982 Long term (current) use of aspirin: Secondary | ICD-10-CM | POA: Insufficient documentation

## 2016-09-27 DIAGNOSIS — Z809 Family history of malignant neoplasm, unspecified: Secondary | ICD-10-CM | POA: Diagnosis not present

## 2016-09-27 DIAGNOSIS — E669 Obesity, unspecified: Secondary | ICD-10-CM | POA: Insufficient documentation

## 2016-09-27 DIAGNOSIS — G4733 Obstructive sleep apnea (adult) (pediatric): Secondary | ICD-10-CM | POA: Diagnosis not present

## 2016-09-27 DIAGNOSIS — H548 Legal blindness, as defined in USA: Secondary | ICD-10-CM | POA: Insufficient documentation

## 2016-09-27 DIAGNOSIS — Z9861 Coronary angioplasty status: Secondary | ICD-10-CM | POA: Diagnosis not present

## 2016-09-27 DIAGNOSIS — E559 Vitamin D deficiency, unspecified: Secondary | ICD-10-CM | POA: Insufficient documentation

## 2016-09-27 DIAGNOSIS — I10 Essential (primary) hypertension: Secondary | ICD-10-CM | POA: Diagnosis not present

## 2016-09-27 HISTORY — PX: SAVORY DILATION: SHX5439

## 2016-09-27 HISTORY — PX: BIOPSY: SHX5522

## 2016-09-27 HISTORY — PX: ESOPHAGOGASTRODUODENOSCOPY: SHX5428

## 2016-09-27 SURGERY — EGD (ESOPHAGOGASTRODUODENOSCOPY)
Anesthesia: Moderate Sedation

## 2016-09-27 MED ORDER — MINERAL OIL PO OIL
TOPICAL_OIL | ORAL | Status: AC
  Filled 2016-09-27: qty 30

## 2016-09-27 MED ORDER — MIDAZOLAM HCL 5 MG/5ML IJ SOLN
INTRAMUSCULAR | Status: DC | PRN
  Administered 2016-09-27: 1 mg via INTRAVENOUS
  Administered 2016-09-27 (×3): 2 mg via INTRAVENOUS

## 2016-09-27 MED ORDER — MEPERIDINE HCL 100 MG/ML IJ SOLN
INTRAMUSCULAR | Status: DC
Start: 2016-09-27 — End: 2016-09-27
  Filled 2016-09-27: qty 2

## 2016-09-27 MED ORDER — LIDOCAINE VISCOUS 2 % MT SOLN
OROMUCOSAL | Status: AC
  Filled 2016-09-27: qty 15

## 2016-09-27 MED ORDER — SODIUM CHLORIDE 0.9 % IV SOLN
INTRAVENOUS | Status: DC
  Administered 2016-09-27: 09:00:00 via INTRAVENOUS

## 2016-09-27 MED ORDER — LIDOCAINE VISCOUS 2 % MT SOLN
OROMUCOSAL | Status: DC | PRN
  Administered 2016-09-27: 1 via OROMUCOSAL

## 2016-09-27 MED ORDER — MEPERIDINE HCL 100 MG/ML IJ SOLN
INTRAMUSCULAR | Status: DC | PRN
  Administered 2016-09-27 (×2): 50 mg via INTRAVENOUS
  Administered 2016-09-27: 25 mg via INTRAVENOUS

## 2016-09-27 MED ORDER — STERILE WATER FOR IRRIGATION IR SOLN
Status: DC | PRN
  Administered 2016-09-27: 10:00:00

## 2016-09-27 MED ORDER — MIDAZOLAM HCL 5 MG/5ML IJ SOLN
INTRAMUSCULAR | Status: AC
  Filled 2016-09-27: qty 10

## 2016-09-27 NOTE — Discharge Instructions (Signed)
I dilated your esophagus. I DID NOT SEE A DEFINITE NARROWING IN YOUR esophagus. You have a small stomach polyps & gastritis. I biopsied your ESOPHAGUS AND stomach.   DRINK WATER TO KEEP YOUR URINE LIGHT YELLOW.  CONTINUE YOUR WEIGHT LOSS EFFORTS.  FOLLOW A LOW FAT DIET. SEE INFO BELOW. MEATS SHOULD BE BAKED, BROILED, OR BOILED. NO FRIED FOODS.  YOUR BIOPSY RESULTS WILL BE AVAILABLE IN MY CHART AFTER JUN 7 AND MY OFFICE WILL CONTACT YOU IN 10-14 DAYS WITH YOUR RESULTS.   FOLLOW UP IN SEP 2018. UPPER ENDOSCOPY AFTER CARE Read the instructions outlined below and refer to this sheet in the next week. These discharge instructions provide you with general information on caring for yourself after you leave the hospital. While your treatment has been planned according to the most current medical practices available, unavoidable complications occasionally occur. If you have any problems or questions after discharge, call DR. Yacine Garriga, [phone].  ACTIVITY  You may resume your regular activity, but move at a slower pace for the next 24 hours.   Take frequent rest periods for the next 24 hours.   Walking will help get rid of the air and reduce the bloated feeling in your belly (abdomen).   No driving for 24 hours (because of the medicine (anesthesia) used during the test).   You may shower.   Do not sign any important legal documents or operate any machinery for 24 hours (because of the anesthesia used during the test).    NUTRITION  Drink plenty of fluids.   You may resume your normal diet as instructed by your doctor.   Begin with a light meal and progress to your normal diet. Heavy or fried foods are harder to digest and may make you feel sick to your stomach (nauseated).   Avoid alcoholic beverages for 24 hours or as instructed.    MEDICATIONS  You may resume your normal medications.   WHAT YOU CAN EXPECT TODAY  Some feelings of bloating in the abdomen.   Passage of more  gas than usual.    IF YOU HAD A BIOPSY TAKEN DURING THE UPPER ENDOSCOPY:  Eat a soft diet IF YOU HAVE NAUSEA, BLOATING, ABDOMINAL PAIN, OR VOMITING.    FINDING OUT THE RESULTS OF YOUR TEST Not all test results are available during your visit. [name] WILL CALL YOU WITHIN 7 DAYS OF YOUR PROCEDUE WITH YOUR RESULTS. Do not assume everything is normal if you have not heard from DR. Aniayah Alaniz IN ONE WEEK, CALL HER OFFICE AT [phone].  SEEK IMMEDIATE MEDICAL ATTENTION AND CALL THE OFFICE: [phone] IF:  You have more than a spotting of blood in your stool.   Your belly is swollen (abdominal distention).   You are nauseated or vomiting.   You have a temperature over 101F.   You have abdominal pain or discomfort that is severe or gets worse throughout the day.  Gastritis  Gastritis is an inflammation (the body's way of reacting to injury and/or infection) of the stomach. It is often caused by viral or bacterial (germ) infections. It can also be caused BY ASPIRIN, BC/GOODY POWDER'S, (IBUPROFEN) MOTRIN, OR ALEVE (NAPROXEN), chemicals (including alcohol), SPICY FOODS, and medications. This illness may be associated with generalized malaise (feeling tired, not well), UPPER ABDOMINAL STOMACH cramps, and fever. One common bacterial cause of gastritis is an organism known as H. Pylori. This can be treated with antibiotics.    DYSPHAGIA DYSPHAGIA can be caused by stomach acid backing up  into the tube that carries food from the mouth down to the stomach (lower esophagus).  TREATMENT There are a number of medicines used to treat DYSPHAGIA including: Antacids.  Proton-pump inhibitors:OMEPRAZOLE  HOME CARE INSTRUCTIONS Eat 2-3 hours before going to bed.  Try to reach and maintain a healthy weight.  Do not eat just a few very large meals. Instead, eat 4 TO 6 smaller meals throughout the day.  Try to identify foods and beverages that make your symptoms worse, and avoid these.  Avoid tight  clothing.  Do not exercise right after eating.   Low-Fat Diet BREADS, CEREALS, PASTA, RICE, DRIED PEAS, AND BEANS These products are high in carbohydrates and most are low in fat. Therefore, they can be increased in the diet as substitutes for fatty foods. They too, however, contain calories and should not be eaten in excess. Cereals can be eaten for snacks as well as for breakfast.   FRUITS AND VEGETABLES It is good to eat fruits and vegetables. Besides being sources of fiber, both are rich in vitamins and some minerals. They help you get the daily allowances of these nutrients. Fruits and vegetables can be used for snacks and desserts.  MEATS Limit lean meat, chicken, [place], and fish to no more than 6 ounces per day. Beef, Pork, and Lamb Use lean cuts of beef, pork, and lamb. Lean cuts include:  Extra-lean ground beef.  Arm roast.  Sirloin tip.  Center-cut ham.  Round steak.  Loin chops.  Rump roast.  Tenderloin.  Trim all fat off the outside of meats before cooking. It is not necessary to severely decrease the intake of red meat, but lean choices should be made. Lean meat is rich in protein and contains a highly absorbable form of iron. Premenopausal women, in particular, should avoid reducing lean red meat because this could increase the risk for low red blood cells (iron-deficiency anemia).  Chicken and [place] These are good sources of protein. The fat of poultry can be reduced by removing the skin and underlying fat layers before cooking. Chicken and [place] can be substituted for lean red meat in the diet. Poultry should not be fried or covered with high-fat sauces. Fish and Shellfish Fish is a good source of protein. Shellfish contain cholesterol, but they usually are low in saturated fatty acids. The preparation of fish is important. Like chicken and [place], they should not be fried or covered with high-fat sauces. EGGS Egg whites contain no fat or cholesterol. They can be  eaten often. Try 1 to 2 egg whites instead of whole eggs in recipes or use egg substitutes that do not contain yolk. MILK AND DAIRY PRODUCTS Use skim or 1% milk instead of 2% or whole milk. Decrease whole milk, natural, and processed cheeses. Use nonfat or low-fat (2%) cottage cheese or low-fat cheeses made from vegetable oils. Choose nonfat or low-fat (1 to 2%) yogurt. Experiment with evaporated skim milk in recipes that call for heavy cream. Substitute low-fat yogurt or low-fat cottage cheese for sour cream in dips and salad dressings. Have at least 2 servings of low-fat dairy products, such as 2 glasses of skim (or 1%) milk each day to help get your daily calcium intake. FATS AND OILS Reduce the total intake of fats, especially saturated fat. Butterfat, lard, and beef fats are high in saturated fat and cholesterol. These should be avoided as much as possible. Vegetable fats do not contain cholesterol, but certain vegetable fats, such as coconut oil, palm oil,  and palm kernel oil are very high in saturated fats. These should be limited. These fats are often used in bakery goods, processed foods, popcorn, oils, and nondairy creamers. Vegetable shortenings and some peanut butters contain hydrogenated oils, which are also saturated fats. Read the labels on these foods and check for saturated vegetable oils. Unsaturated vegetable oils and fats do not raise blood cholesterol. However, they should be limited because they are fats and are high in calories. Total fat should still be limited to 30% of your daily caloric intake. Desirable liquid vegetable oils are corn oil, cottonseed oil, olive oil, canola oil, safflower oil, soybean oil, and sunflower oil. Peanut oil is not as good, but small amounts are acceptable. Buy a heart-healthy tub margarine that has no partially hydrogenated oils in the ingredients. Mayonnaise and salad dressings often are made from unsaturated fats, but they should also be limited because  of their high calorie and fat content. Seeds, nuts, peanut butter, olives, and avocados are high in fat, but the fat is mainly the unsaturated type. These foods should be limited mainly to avoid excess calories and fat. OTHER EATING TIPS Snacks  Most sweets should be limited as snacks. They tend to be rich in calories and fats, and their caloric content outweighs their nutritional value. Some good choices in snacks are graham crackers, melba toast, soda crackers, bagels (no egg), English muffins, fruits, and vegetables. These snacks are preferable to snack crackers, [place] fries, TORTILLA CHIPS, and POTATO chips. Popcorn should be air-popped or cooked in small amounts of liquid vegetable oil. Desserts Eat fruit, low-fat yogurt, and fruit ices instead of pastries, cake, and cookies. Sherbet, angel food cake, gelatin dessert, frozen low-fat yogurt, or other frozen products that do not contain saturated fat (pure fruit juice bars, frozen ice pops) are also acceptable.  COOKING METHODS Choose those methods that use little or no fat. They include: Poaching.  Braising.  Steaming.  Grilling.  Baking.  Stir-frying.  Broiling.  Microwaving.  Foods can be cooked in a nonstick pan without added fat, or use a nonfat cooking spray in regular cookware. Limit fried foods and avoid frying in saturated fat. Add moisture to lean meats by using water, broth, cooking wines, and other nonfat or low-fat sauces along with the cooking methods mentioned above. Soups and stews should be chilled after cooking. The fat that forms on top after a few hours in the refrigerator should be skimmed off. When preparing meals, avoid using excess salt. Salt can contribute to raising blood pressure in some people.  EATING AWAY FROM HOME Order entres, potatoes, and vegetables without sauces or butter. When meat exceeds the size of a deck of cards (3 to 4 ounces), the rest can be taken home for another meal. Choose vegetable or fruit  salads and ask for low-calorie salad dressings to be served on the side. Use dressings sparingly. Limit high-fat toppings, such as bacon, crumbled eggs, cheese, sunflower seeds, and olives. Ask for heart-healthy tub margarine instead of butter.

## 2016-09-27 NOTE — H&P (View-Only) (Signed)
Subjective:    Patient ID: Mike Davenport, male    DOB: 10-04-1970, 46 y.o.   MRN: [id number]  Octavio Graves, DO  HPI NOTICED SOMETIMES FOOD SITS INHIS CHEST AND HE CAN BUR[P AND SOLID PIECES OF FOOD COMES BACK UP WHEN HE BURPS. [place] DUE TO CHEST PAIN. OFF & ON FOR A COUPLE OF YEARS BUT NOW SEEMS WORSE. SYMPTOMS ONCE A WEEK. IF GETS REAL BAD MAKES HIMSELF THROWS UP. BMs: PRETTY GOOD. USES CPAP. PAIN IN ABDOMEN IF DIVERTICULITIS(X2). BEEN LOSING WEIGHT.   PT DENIES FEVER, CHILLS, HEMATOCHEZIA, HEMATEMESIS, nausea, melena, diarrhea, SHORTNESS OF BREATH,  CHANGE IN BOWEL IN HABITS, abdominal pain, problems with sedation, OR heartburn or indigestion.  Past Medical History:  Diagnosis Date  . Carpal tunnel syndrome   . Condylomata acuminata in male   . Coronary artery disease   . GERD (gastroesophageal reflux disease)   . Gout   . Hyperlipidemia   . Hypertension   . Knee pain, bilateral   . Legal blindness   . Lumbar degenerative disc disease   . Obesity   . OSA (obstructive sleep apnea)   . Seizures ([place])   . Tobacco abuse   . Vitamin D deficiency    Past Surgical History:  Procedure Laterality Date  . CORONARY ANGIOPLASTY  2016  . 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    Allergies  Allergen Reactions  . Other Anaphylaxis    [place] nuts   Current Outpatient Prescriptions  Medication Sig Dispense Refill  . amLODipine (NORVASC) 10 MG tablet TAKE 1 TABLET (10 MG TOTAL) BY MOUTH DAILY.    Marland Kitchen aspirin EC 81 MG tablet Take 81 mg by mouth daily.    Marland Kitchen atorvastatin (LIPITOR) 80 MG tablet Take 80 mg by mouth at bedtime.    . hydrochlorothiazide (HYDRODIURIL) 25 MG tablet TAKE 1 TABLET (25 MG TOTAL) BY MOUTH DAILY.  3X/WEEK   . isosorbide mononitrate (IMDUR) 60 MG 24 hr tablet Take 1 tablet (60 mg total) by mouth daily.    Marland Kitchen KLOR-CON M20 20 MEQ tablet Take 20 mEq by mouth. with food. Takes  twice/day every other day    . metoprolol (LOPRESSOR) 100 MG tablet TAKE 1 TABLET (100 MG TOTAL) BY MOUTH 2 (TWO) TIMES DAILY.    . nitroGLYCERIN (NITROSTAT) 0.4 MG SL tablet Place 1 tablet (0.4 mg total) under the tongue every 5 (five) minutes as needed for chest pain.    Marland Kitchen omeprazole (PRILOSEC) 40 MG capsule Take 1 capsule (40 mg total) by mouth daily.    Marland Kitchen oxyCODONE-acetaminophen (PERCOCET) 10-325 MG tablet TAKE 1 TABLET BY MOUTH EVERY SIX HOURS **30 DAYS**.    Marland Kitchen torsemide (DEMADEX) 20 MG tablet TAKE 1 TABLET (20 MG TOTAL) BY MOUTH DAILY AS NEEDED (SWELLING).    Marland Kitchen      .      .       Family History  Problem Relation Age of Onset  . High blood pressure Mother   . Cancer Maternal Grandmother    Social History   Social History  . Marital status: Married    Spouse name: N/A  . Number of children: N/A  . Years of education: N/A   Occupational History  . shipping and recieving     employer: [employer]    Social History Main Topics  . Smoking status: Former Smoker    Packs/day: 0.50  Types: Cigarettes    Quit date: 04/28/2014  . Smokeless tobacco: Never Used     Comment: decreased smoking  . Alcohol use No  . Drug use: No  . Sexual activity: Not Asked   Other Topics Concern  . None   Social History Narrative   Pt currently lives with brother and sister in law    Review of Systems PER HPI OTHERWISE ALL SYSTEMS ARE NEGATIVE.    Objective:   Physical Exam  Constitutional: He is oriented to person, place, and time. He appears well-developed and well-nourished. No distress.  HENT:  Head: Normocephalic and atraumatic.  Mouth/Throat: Oropharynx is clear and moist. No oropharyngeal exudate.  Eyes: Pupils are equal, round, and reactive to light. No scleral icterus.  Neck: Normal range of motion. Neck supple.  Cardiovascular: Normal rate, regular rhythm and normal heart sounds.   Pulmonary/Chest: Effort normal and breath sounds normal. No respiratory distress.  Abdominal: Soft.  Bowel sounds are normal. He exhibits no distension. There is no tenderness.  Musculoskeletal: He exhibits no edema.  Lymphadenopathy:    He has no cervical adenopathy.  Neurological: He is alert and oriented to person, place, and time.  PT IS BLIND  Psychiatric: He has a normal mood and affect.  Vitals reviewed.     Assessment & Plan:

## 2016-09-27 NOTE — Interval H&P Note (Signed)
History and Physical Interval Note:  09/27/2016 9:48 AM  Mike Davenport  has presented today for surgery, with the diagnosis of DYSPHAGIA/DYSPEPSIA  The various methods of treatment have been discussed with the patient and family. After consideration of risks, benefits and other options for treatment, the patient has consented to  Procedure(s) with comments: ESOPHAGOGASTRODUODENOSCOPY (EGD) (N/A) - 300 SAVORY DILATION (N/A) as a surgical intervention .  The patient's history has been reviewed, patient examined, no change in status, stable for surgery.  I have reviewed the patient's chart and labs.  Questions were answered to the patient's satisfaction.     [employer]

## 2016-09-27 NOTE — Op Note (Signed)
[facility] Patient Name: Mike Davenport Procedure Date: 09/27/2016 9:46 AM MRN: [id number] Date of Birth: [birth year] Attending MD: Barney Drain , MD CSN: [id number] Age: 46 Admit Type: Outpatient Procedure:                Upper GI endoscopy WITH COLD FORCEPS                            BIOPSY/ESOPHAGEAL DILATION Indications:              Dysphagia, PMHX: GERD/ONPERCOCET Providers:                Barney Drain, MD, Lurline Del, RN, Tammy Vaught, RN,                            Janeece Riggers, RN Referring MD:             Octavio Graves MD, MD Medicines:                Meperidine 125 mg IV, Midazolam 7 mg IV Complications:            No immediate complications. Estimated Blood Loss:     Estimated blood loss was minimal. Procedure:                Pre-Anesthesia Assessment:                           - Prior to the procedure, a History and Physical                            was performed, and patient medications and                            allergies were reviewed. The patient's tolerance of                            previous anesthesia was also reviewed. The risks                            and benefits of the procedure and the sedation                            options and risks were discussed with the patient.                            All questions were answered, and informed consent                            was obtained. Prior Anticoagulants: The patient has                            taken aspirin, last dose was 1 day prior to                            procedure. ASA Grade Assessment: II - A patient  with mild systemic disease. After reviewing the                            risks and benefits, the patient was deemed in                            satisfactory condition to undergo the procedure.                            After obtaining informed consent, the endoscope was                            passed under direct vision. Throughout the                     procedure, the patient's blood pressure, pulse, and                            oxygen saturations were monitored continuously. The                            [id number]) was introduced through the mouth,                            and advanced to the second part of duodenum. The                            upper GI endoscopy was accomplished without                            difficulty. The patient tolerated the procedure                            well. Scope In: 10:09:48 AM Scope Out: 10:21:09 AM Total Procedure Duration: 0 hours 11 minutes 21 seconds  Findings:      The examined esophagus was normal. Biopsies were obtained from the       proximal and distal esophagus with cold forceps for histology of       suspected eosinophilic esophagitis. Estimated blood loss was minimal. A       guidewire was placed and the scope was withdrawn. Dilation was performed       with a Savary dilator with mild resistance at 14 mm, 15 mm, 16 mm and 17       mm DUE TO POSSIBEL PROXIMAL ESOPHAGEAL WEB. Estimated blood loss was       minimal.      Patchy mild inflammation characterized by congestion (edema) and       erythema was found in the gastric antrum. Biopsies were taken with a       cold forceps for Helicobacter pylori testing.      Multiple small sessile polyps with no bleeding were found in the gastric       body. This was biopsied with a cold forceps for histology.      The examined duodenum was normal. Impression:               - MILD Gastritis. Biopsied.                           -  Multiple gastric polyps. Biopsied.                           - DYSPHAGIA MOST LIKELY DUE TO GERD Moderate Sedation:      Moderate (conscious) sedation was administered by the endoscopy nurse       and supervised by the endoscopist. The following parameters were       monitored: oxygen saturation, heart rate, blood pressure, and response       to care. Total physician intraservice time was  24 minutes. Recommendation:           - Await pathology results.                           - Continue present medications.                           - High fiber diet and low fat diet.                           - Return to my office in 4 months.                           - Patient has a contact number available for                            emergencies. The signs and symptoms of potential                            delayed complications were discussed with the                            patient. Return to normal activities tomorrow.                            Written discharge instructions were provided to the                            patient. Procedure Code(s):        --- Professional ---                           [phone], Esophagogastroduodenoscopy, flexible,                            transoral; with insertion of guide wire followed by                            passage of dilator(s) through esophagus over guide                            wire                           [id number], Esophagogastroduodenoscopy, flexible,  transoral; with biopsy, single or multiple                           [id number], Moderate sedation services provided by the                            same physician or other qualified health care                            professional performing the diagnostic or                            therapeutic service that the sedation supports,                            requiring the presence of an independent trained                            observer to assist in the monitoring of the                            patient's level of consciousness and physiological                            status; initial 15 minutes of intraservice time,                            patient age 70 years or older                           [phone], Moderate sedation services; each additional                            15 minutes intraservice time Diagnosis Code(s):        ---  Professional ---                           K29.70, Gastritis, unspecified, without bleeding                           K31.7, Polyp of stomach and duodenum                           R13.10, Dysphagia, unspecified CPT copyright 2016 American Medical Association. All rights reserved. The codes documented in this report are preliminary and upon coder review may  be revised to meet current compliance requirements. Barney Drain, MD Barney Drain, MD 09/27/2016 10:28:38 AM This report has been signed electronically. Number of Addenda: 0

## 2016-09-27 NOTE — Progress Notes (Signed)
Please excuse Mike Davenport from work today.  She was with her husband for a procedure and he needs to be supervised for 24 hours due to sedation.   If any questions please call.    Charlyne Petrin, RN

## 2016-10-04 ENCOUNTER — Encounter (HOSPITAL_COMMUNITY): Payer: Self-pay | Admitting: Gastroenterology

## 2016-10-06 ENCOUNTER — Telehealth: Payer: Self-pay | Admitting: Gastroenterology

## 2016-10-06 NOTE — Telephone Encounter (Signed)
Please call pt. His stomach Bx shows gastritis. THE ESOPHAGUS BIOPSIES ARE NORMAL. HIS DIFFICULTY SWALLOWING IS DUE TO UNCONTROLLED REFLUX.   DRINK WATER TO KEEP YOUR URINE LIGHT YELLOW. CONTINUE YOUR WEIGHT LOSS EFFORTS. FOLLOW A LOW FAT DIET. SEE INFO BELOW. MEATS SHOULD BE BAKED, BROILED, OR BOILED. NO FRIED FOODS.  FOLLOW UP IN SEP 2018 E30 DYSPHAGIA/GERD.

## 2016-10-07 NOTE — Telephone Encounter (Signed)
OV made °

## 2016-10-07 NOTE — Telephone Encounter (Signed)
Pt is aware of results. 

## 2016-10-22 ENCOUNTER — Ambulatory Visit (INDEPENDENT_AMBULATORY_CARE_PROVIDER_SITE_OTHER): Payer: Medicare Other | Admitting: Cardiovascular Disease

## 2016-10-22 ENCOUNTER — Encounter: Payer: Self-pay | Admitting: Cardiovascular Disease

## 2016-10-22 VITALS — BP 118/78 | HR 77 | Ht 70.0 in | Wt 325.0 lb

## 2016-10-22 DIAGNOSIS — R1310 Dysphagia, unspecified: Secondary | ICD-10-CM | POA: Diagnosis not present

## 2016-10-22 DIAGNOSIS — I252 Old myocardial infarction: Secondary | ICD-10-CM | POA: Diagnosis not present

## 2016-10-22 DIAGNOSIS — I10 Essential (primary) hypertension: Secondary | ICD-10-CM | POA: Diagnosis not present

## 2016-10-22 DIAGNOSIS — I25118 Atherosclerotic heart disease of native coronary artery with other forms of angina pectoris: Secondary | ICD-10-CM | POA: Diagnosis not present

## 2016-10-22 DIAGNOSIS — R079 Chest pain, unspecified: Secondary | ICD-10-CM | POA: Diagnosis not present

## 2016-10-22 DIAGNOSIS — R0609 Other forms of dyspnea: Secondary | ICD-10-CM | POA: Diagnosis not present

## 2016-10-22 DIAGNOSIS — E7800 Pure hypercholesterolemia, unspecified: Secondary | ICD-10-CM

## 2016-10-22 DIAGNOSIS — I5032 Chronic diastolic (congestive) heart failure: Secondary | ICD-10-CM

## 2016-10-22 DIAGNOSIS — R0600 Dyspnea, unspecified: Secondary | ICD-10-CM

## 2016-10-22 NOTE — Patient Instructions (Signed)

## 2016-10-22 NOTE — Progress Notes (Signed)
SUBJECTIVE: The patient presents for follow-up. He underwent EGD earlier this month which demonstrated mild gastritis and several gastric polyps. Dysphagia was felt due to GERD.  He has a prior history of non-STEMI. Coronary angiography was performed in 2016 but a guidewire could not be advanced across an ostial diagonal lesion at [facility]. He ruled out for an acute coronary syndrome with serial normal troponins.   He underwent a nuclear stress test earlier this year which demonstrated a moderate amount of ischemia in the anterolateral myocardium, LVEF 58%. At that time I recommended Imdur be increased to 60 mg daily.  He is feeling much better. He denies chest pain and shortness of breath. He has lost 29 pounds by changing his diet and walking for 1 hour daily. He seldom has palpitations.   Review of Systems: As per "subjective", otherwise negative.  Allergies  Allergen Reactions  . Other Anaphylaxis    [place] nuts    Current Outpatient Prescriptions  Medication Sig Dispense Refill  . amLODipine (NORVASC) 10 MG tablet TAKE 1 TABLET (10 MG TOTAL) BY MOUTH DAILY. 30 tablet 0  . aspirin EC 81 MG tablet Take 81 mg by mouth daily.    Marland Kitchen atorvastatin (LIPITOR) 80 MG tablet Take 80 mg by mouth at bedtime.  2  . docusate sodium (COLACE) 100 MG capsule Take 100 mg by mouth 2 (two) times daily as needed for mild constipation.     . hydrochlorothiazide (HYDRODIURIL) 25 MG tablet TAKE 1 TABLET (25 MG TOTAL) BY MOUTH DAILY. (Patient taking differently: Take 25 mg by mouth every Monday, Wednesday, and Friday. TAKE 1 TABLET (25 MG TOTAL) BY MOUTH three x week) 30 tablet 0  . isosorbide mononitrate (IMDUR) 60 MG 24 hr tablet Take 1 tablet (60 mg total) by mouth daily. 90 tablet 3  . KLOR-CON M20 20 MEQ tablet Take 20 mEq by mouth See admin instructions. Takes twice daily every other day  11  . metoprolol (LOPRESSOR) 100 MG tablet TAKE 1 TABLET (100 MG TOTAL) BY MOUTH 2 (TWO) TIMES DAILY.  60 tablet 6  . nitroGLYCERIN (NITROSTAT) 0.4 MG SL tablet Place 1 tablet (0.4 mg total) under the tongue every 5 (five) minutes as needed for chest pain. 25 tablet 3  . omeprazole (PRILOSEC) 40 MG capsule Take 1 capsule (40 mg total) by mouth daily. 30 capsule 6  . oxyCODONE-acetaminophen (PERCOCET) 10-325 MG tablet TAKE 1 TABLET BY MOUTH EVERY SIX HOURS FOR PAIN  **30 DAYS**.  0  . torsemide (DEMADEX) 20 MG tablet TAKE 1 TABLET (20 MG TOTAL) BY MOUTH DAILY AS NEEDED (SWELLING). 30 tablet 3   No current facility-administered medications for this visit.     Past Medical History:  Diagnosis Date  . Carpal tunnel syndrome   . Condylomata acuminata in male   . Coronary artery disease   . GERD (gastroesophageal reflux disease)   . Gout   . Hyperlipidemia   . Hypertension   . Knee pain, bilateral   . Legal blindness   . Lumbar degenerative disc disease   . Obesity   . OSA (obstructive sleep apnea)   . Seizures ([place])   . Tobacco abuse   . Vitamin D deficiency     Past Surgical History:  Procedure Laterality Date  . BIOPSY  09/27/2016   Procedure: BIOPSY;  Surgeon: [name], MD;  [facility];  Service: Endoscopy;;  esophagus and gastric  . CORONARY ANGIOPLASTY  2016  .  ESOPHAGOGASTRODUODENOSCOPY N/A 09/27/2016   Procedure: ESOPHAGOGASTRODUODENOSCOPY (EGD);  Surgeon: [name], MD;  [facility];  Service: Endoscopy;  Laterality: N/A;  300  . 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   Marland Kitchen SAVORY DILATION N/A 09/27/2016   Procedure: SAVORY DILATION;  Surgeon: [name], MD;  [facility];  Service: Endoscopy;  Laterality: N/A;    Social History   Social History  . Marital status: Married    Spouse name: N/A  . Number of children: N/A  . Years of education: N/A   Occupational History  . shipping and recieving     employer: [employer]    Social History Main Topics  .  Smoking status: Former Smoker    Packs/day: 0.50    Types: Cigarettes    Quit date: 04/28/2014  . Smokeless tobacco: Never Used     Comment: decreased smoking  . Alcohol use No  . Drug use: No  . Sexual activity: Not on file   Other Topics Concern  . Not on file   Social History Narrative   Pt currently lives with brother and sister in law      Vitals:   10/22/16 1107  BP: 118/78  Pulse: 77  SpO2: 98%  Weight: (!) 325 lb (147.4 kg)  Height: 5\' 10"  (1.778 m)    Wt Readings from Last 3 Encounters:  10/22/16 (!) 325 lb (147.4 kg)  09/16/16 (!) 326 lb 9.6 oz (148.1 kg)  07/22/16 (!) 354 lb (160.6 kg)     PHYSICAL EXAM General: NAD HEENT: Blind. Neck: No JVD, no thyromegaly. Lungs: Clear to auscultation bilaterally with normal respiratory effort. CV: Nondisplaced PMI.  Regular rate and rhythm, normal S1/S2, no S3/S4, no murmur. No pretibial or periankle edema.  No carotid bruit.   Abdomen: Firm, protuberant.  Neurologic: Alert and oriented.  Psych: Normal affect. Skin: Normal. Musculoskeletal: No gross deformities.    ECG: Most recent ECG reviewed.   Labs: Lab Results  Component Value Date/Time   K 3.6 01/22/2014 04:07 PM   BUN 17 01/22/2014 04:07 PM   CREATININE 1.37 (H) 01/22/2014 04:07 PM   ALT 22 08/17/2011 12:18 PM   HGB 17.7 (H) 08/19/2013 09:48 AM     Lipids: Lab Results  Component Value Date/Time   LDLCALC 79 04/07/2011 02:56 PM   LDLDIRECT 131 (H) 08/18/2007 10:00 PM   CHOL 145 04/07/2011 02:56 PM   TRIG 155 (H) 04/07/2011 02:56 PM   HDL 35 (L) 04/07/2011 02:56 PM       ASSESSMENT AND PLAN: 1. Chest pain in the context of coronary artery disease with prior history of non-STEMI and ostial diagonal lesion with an abnormal nuclear stress test: Symptoms have significant improved since dietary modification and significant weight loss with exercise. No need for coronary angiography at this time. Continue aspirin, Lipitor, metoprolol, and Imdur 60  mg.  2. Chronic diastolic heart failure: Euvolemic. No changes to diuretic regimen.  3. Hypertension: Controlled. No changes.  4. Hyperlipidemia: Continue Lipitor.  5. Dysphagia for solids with associated choking and vomiting: Symptoms due to GERD. Follows with GI. Recent EGD reviewed above. Has changed dietary habits and has lost a significant amount of weight with exercise.    Disposition: Follow up 4 months.  Kate Sable, M.D., F.A.C.C.

## 2016-12-28 ENCOUNTER — Ambulatory Visit (INDEPENDENT_AMBULATORY_CARE_PROVIDER_SITE_OTHER): Payer: Medicare Other | Admitting: Gastroenterology

## 2016-12-28 ENCOUNTER — Other Ambulatory Visit: Payer: Self-pay

## 2016-12-28 ENCOUNTER — Encounter: Payer: Self-pay | Admitting: Gastroenterology

## 2016-12-28 VITALS — BP 137/87 | HR 74 | Temp 97.0°F | Ht 70.0 in | Wt 293.0 lb

## 2016-12-28 DIAGNOSIS — K219 Gastro-esophageal reflux disease without esophagitis: Secondary | ICD-10-CM

## 2016-12-28 DIAGNOSIS — I25118 Atherosclerotic heart disease of native coronary artery with other forms of angina pectoris: Secondary | ICD-10-CM

## 2016-12-28 DIAGNOSIS — R933 Abnormal findings on diagnostic imaging of other parts of digestive tract: Secondary | ICD-10-CM

## 2016-12-28 DIAGNOSIS — K6289 Other specified diseases of anus and rectum: Secondary | ICD-10-CM

## 2016-12-28 MED ORDER — NA SULFATE-K SULFATE-MG SULF 17.5-3.13-1.6 GM/177ML PO SOLN: 1.0000 | ORAL | 0 refills | Status: DC

## 2016-12-28 NOTE — Assessment & Plan Note (Signed)
CT abdomen pelvis in March suggestive of sigmoid diverticulitis. No prior colonoscopy. Patient with couple month history of intermittent rectal pain. Recommend colonoscopy at this time.  I have discussed the risks, alternatives, benefits with regards to but not limited to the risk of reaction to medication, bleeding, infection, perforation and the patient is agreeable to proceed. Written consent to be obtained.

## 2016-12-28 NOTE — Assessment & Plan Note (Signed)
Symptoms well controlled at this point. No dysphagia. Continue omeprazole. Continue weight loss efforts and antireflux measures.

## 2016-12-28 NOTE — Patient Instructions (Addendum)
1. Continue your weight loss efforts.  2. Continue omeprazole daily for acid reflux. 3. Colonoscopy planned to further evaluate your rectal pain as well as previously noted abnormal colon on CT (likely diverticulitis). Please see separate instructions.

## 2016-12-28 NOTE — Progress Notes (Signed)
Primary Care Physician: Octavio Graves, DO  Primary Gastroenterologist:  Barney Drain, MD   Chief Complaint  Patient presents with  . Diverticulitis  . Rectal Pain    HPI: Mike Davenport is a 46 y.o. male here here for f/u dyspepsia, GERD, dysphagia. Patient EGD back in June with dilation. Esophagus appeared normal. Biopsies taken from the proximal and distal esophagus to look for eosinophilic esophagitis. Esophagus was dilated. He had multiple sessile polyps with no bleeding in the gastric body with benign biopsies. Gastritis but no H. pylori. Dysphagia felt to be due to GERD. Clinically he states his reflux is well-controlled on medication. He has no further dysphagia.  He complains of new onset rectal pain over the last couple of months. Intermittent. Unrelated to bowel function. Just happens whenever he wants to. Sometimes has to strain couple times per week for bowel movement. Has a bowel movement every day. No melena rectal bleeding. No abdominal pain this time. Patient states he was diagnosed with diverticul[facility] at [facility] back in March 2018. CT abdomen pelvis report pulled, he has severe sigmoid colon diverticulosis with superimposed segment of pericolonic fat stranding. Severe canal stenosis L3-L4. Severe L4-L5 neural foraminal narrowing.  Patient is a radical colonoscopy. He turns 46 next month. No family history of colon cancer to his knowledge.  Current Outpatient Prescriptions  Medication Sig Dispense Refill  . amLODipine (NORVASC) 10 MG tablet TAKE 1 TABLET (10 MG TOTAL) BY MOUTH DAILY. 30 tablet 0  . aspirin EC 81 MG tablet Take 81 mg by mouth daily.    Marland Kitchen atorvastatin (LIPITOR) 80 MG tablet Take 80 mg by mouth at bedtime.  2  . docusate sodium (COLACE) 100 MG capsule Take 100 mg by mouth 2 (two) times daily as needed for mild constipation.     . hydrochlorothiazide (HYDRODIURIL) 25 MG tablet TAKE 1 TABLET (25 MG TOTAL) BY MOUTH DAILY. (Patient taking  differently: Take 25 mg by mouth every Monday, Wednesday, and Friday. TAKE 1 TABLET (25 MG TOTAL) BY MOUTH three x week) 30 tablet 0  . isosorbide mononitrate (IMDUR) 60 MG 24 hr tablet Take 1 tablet (60 mg total) by mouth daily. 90 tablet 3  . KLOR-CON M20 20 MEQ tablet Take 20 mEq by mouth See admin instructions. Takes twice daily every other day  11  . metoprolol (LOPRESSOR) 100 MG tablet TAKE 1 TABLET (100 MG TOTAL) BY MOUTH 2 (TWO) TIMES DAILY. 60 tablet 6  . nitroGLYCERIN (NITROSTAT) 0.4 MG SL tablet Place 1 tablet (0.4 mg total) under the tongue every 5 (five) minutes as needed for chest pain. 25 tablet 3  . omeprazole (PRILOSEC) 40 MG capsule Take 1 capsule (40 mg total) by mouth daily. 30 capsule 6  . torsemide (DEMADEX) 20 MG tablet TAKE 1 TABLET (20 MG TOTAL) BY MOUTH DAILY AS NEEDED (SWELLING). 30 tablet 3   No current facility-administered medications for this visit.     Allergies as of 12/28/2016 - Review Complete 12/28/2016  Allergen Reaction Noted  . Other Anaphylaxis 06/07/2011   Past Medical History:  Diagnosis Date  . Carpal tunnel syndrome   . Condylomata acuminata in male   . Coronary artery disease   . GERD (gastroesophageal reflux disease)   . Gout   . Hyperlipidemia   . Hypertension   . Knee pain, bilateral   . Legal blindness   . Lumbar degenerative disc disease   . Obesity   . OSA (obstructive sleep apnea)   .  Seizures ([place])   . Tobacco abuse   . Vitamin D deficiency    Past Surgical History:  Procedure Laterality Date  . BIOPSY  09/27/2016   Procedure: BIOPSY;  Surgeon: [name], MD;  [facility];  Service: Endoscopy;;  esophagus and gastric  . CORONARY ANGIOPLASTY  2016  . ESOPHAGOGASTRODUODENOSCOPY N/A 09/27/2016   Procedure: ESOPHAGOGASTRODUODENOSCOPY (EGD);  Surgeon: [name], MD;  [facility];  Service: Endoscopy;  Laterality: N/A;  300  . 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   Marland Kitchen SAVORY DILATION N/A 09/27/2016   Procedure: SAVORY DILATION;  Surgeon: [name], MD;  [facility];  Service: Endoscopy;  Laterality: N/A;   Family History  Problem Relation Age of Onset  . High blood pressure Mother   . Cancer Maternal Grandmother   . Colon cancer Neg Hx    Social History  Substance Use Topics  . Smoking status: Former Smoker    Packs/day: 0.50    Types: Cigarettes    Quit date: 04/28/2014  . Smokeless tobacco: Never Used     Comment: decreased smoking  . Alcohol use No    ROS:  General: Negative for anorexia, weight loss, fever, chills, fatigue, weakness. ENT: Negative for hoarseness, difficulty swallowing , nasal congestion. CV: Negative for chest pain, angina, palpitations, dyspnea on exertion, peripheral edema.  Respiratory: Negative for dyspnea at rest, dyspnea on exertion, cough, sputum, wheezing.  GI: See history of present illness. GU:  Negative for dysuria, hematuria, urinary incontinence, urinary frequency, nocturnal urination.  Endo: Negative for unusual weight change.    Physical Examination:   BP 137/87   Pulse 74   Temp (!) 97 F (36.1 C) (Oral)   Ht 5\' 10"  (1.778 m)   Wt 293 lb (132.9 kg)   BMI 42.04 kg/m   General: Well-nourished, well-developed in no acute distress.  Eyes: No icterus. Mouth: Oropharyngeal mucosa moist and pink , no lesions erythema or exudate. Lungs: Clear to auscultation bilaterally.  Heart: Regular rate and rhythm, no murmurs rubs or gallops.  Abdomen: Bowel sounds are normal, nontender, nondistended, no hepatosplenomegaly or masses, no abdominal bruits or hernia , no rebound or guarding.   Extremities: No lower extremity edema. No clubbing or deformities. Neuro: Alert and oriented x 4   Skin: Warm and dry, no jaundice.   Psych: Alert and cooperative, normal mood and affect.

## 2016-12-29 NOTE — Progress Notes (Signed)
cc'ed to pcp °

## 2017-01-24 ENCOUNTER — Encounter (HOSPITAL_COMMUNITY)
Admission: RE | Disposition: A | Discharge status: Home / Self Care | Payer: Self-pay | Source: Ambulatory Visit | Attending: Gastroenterology

## 2017-01-24 ENCOUNTER — Ambulatory Visit (HOSPITAL_COMMUNITY)
Admission: RE | Admit: 2017-01-24 | Discharge: 2017-01-24 | Disposition: A | Discharge status: Home / Self Care | Payer: Medicare Other | Source: Ambulatory Visit | Attending: Gastroenterology | Admitting: Gastroenterology

## 2017-01-24 ENCOUNTER — Encounter (HOSPITAL_COMMUNITY): Payer: Self-pay | Admitting: *Deleted

## 2017-01-24 DIAGNOSIS — K635 Polyp of colon: Secondary | ICD-10-CM

## 2017-01-24 DIAGNOSIS — I10 Essential (primary) hypertension: Secondary | ICD-10-CM | POA: Insufficient documentation

## 2017-01-24 DIAGNOSIS — E669 Obesity, unspecified: Secondary | ICD-10-CM | POA: Insufficient documentation

## 2017-01-24 DIAGNOSIS — Z79899 Other long term (current) drug therapy: Secondary | ICD-10-CM | POA: Diagnosis not present

## 2017-01-24 DIAGNOSIS — Z6841 Body Mass Index (BMI) 40.0 and over, adult: Secondary | ICD-10-CM | POA: Diagnosis not present

## 2017-01-24 DIAGNOSIS — Z1211 Encounter for screening for malignant neoplasm of colon: Secondary | ICD-10-CM

## 2017-01-24 DIAGNOSIS — Z7982 Long term (current) use of aspirin: Secondary | ICD-10-CM | POA: Diagnosis not present

## 2017-01-24 DIAGNOSIS — D122 Benign neoplasm of ascending colon: Secondary | ICD-10-CM

## 2017-01-24 DIAGNOSIS — I2510 Atherosclerotic heart disease of native coronary artery without angina pectoris: Secondary | ICD-10-CM | POA: Insufficient documentation

## 2017-01-24 DIAGNOSIS — E785 Hyperlipidemia, unspecified: Secondary | ICD-10-CM | POA: Insufficient documentation

## 2017-01-24 DIAGNOSIS — K219 Gastro-esophageal reflux disease without esophagitis: Secondary | ICD-10-CM | POA: Diagnosis not present

## 2017-01-24 DIAGNOSIS — D123 Benign neoplasm of transverse colon: Secondary | ICD-10-CM | POA: Diagnosis not present

## 2017-01-24 DIAGNOSIS — Z1212 Encounter for screening for malignant neoplasm of rectum: Secondary | ICD-10-CM | POA: Diagnosis not present

## 2017-01-24 DIAGNOSIS — Q438 Other specified congenital malformations of intestine: Secondary | ICD-10-CM | POA: Diagnosis not present

## 2017-01-24 DIAGNOSIS — G4733 Obstructive sleep apnea (adult) (pediatric): Secondary | ICD-10-CM | POA: Diagnosis not present

## 2017-01-24 DIAGNOSIS — Z87891 Personal history of nicotine dependence: Secondary | ICD-10-CM | POA: Insufficient documentation

## 2017-01-24 DIAGNOSIS — R933 Abnormal findings on diagnostic imaging of other parts of digestive tract: Secondary | ICD-10-CM

## 2017-01-24 DIAGNOSIS — K6289 Other specified diseases of anus and rectum: Secondary | ICD-10-CM | POA: Insufficient documentation

## 2017-01-24 DIAGNOSIS — D125 Benign neoplasm of sigmoid colon: Secondary | ICD-10-CM | POA: Diagnosis not present

## 2017-01-24 HISTORY — DX: Diverticulitis of intestine, part unspecified, without perforation or abscess without bleeding: K57.92

## 2017-01-24 HISTORY — PX: COLONOSCOPY: SHX5424

## 2017-01-24 SURGERY — COLONOSCOPY
Anesthesia: Moderate Sedation

## 2017-01-24 MED ORDER — MIDAZOLAM HCL 5 MG/5ML IJ SOLN
INTRAMUSCULAR | Status: AC
  Filled 2017-01-24: qty 10

## 2017-01-24 MED ORDER — ONDANSETRON HCL 4 MG/2ML IJ SOLN
INTRAMUSCULAR | Status: AC
  Filled 2017-01-24: qty 2

## 2017-01-24 MED ORDER — ONDANSETRON HCL 4 MG/2ML IJ SOLN
INTRAMUSCULAR | Status: DC | PRN
  Administered 2017-01-24: 4 mg via INTRAVENOUS

## 2017-01-24 MED ORDER — SODIUM CHLORIDE 0.9 % IV SOLN
INTRAVENOUS | Status: DC
  Administered 2017-01-24: 12:00:00 via INTRAVENOUS

## 2017-01-24 MED ORDER — MIDAZOLAM HCL 5 MG/5ML IJ SOLN
INTRAMUSCULAR | Status: DC | PRN
  Administered 2017-01-24 (×3): 2 mg via INTRAVENOUS

## 2017-01-24 MED ORDER — STERILE WATER FOR IRRIGATION IR SOLN
Status: DC | PRN
  Administered 2017-01-24: 2.5 mL

## 2017-01-24 MED ORDER — MEPERIDINE HCL 100 MG/ML IJ SOLN
INTRAMUSCULAR | Status: AC
  Filled 2017-01-24: qty 2

## 2017-01-24 MED ORDER — MEPERIDINE HCL 100 MG/ML IJ SOLN
INTRAMUSCULAR | Status: DC | PRN
  Administered 2017-01-24 (×2): 25 mg via INTRAVENOUS
  Administered 2017-01-24: 50 mg via INTRAVENOUS

## 2017-01-24 NOTE — H&P (Signed)
Primary Care Physician:  Octavio Graves, DO Primary Gastroenterologist:  [name]  Pre-Procedure History & Physical: HPI:  Mike Davenport is a 46 y.o. male here for Rectal pain/diverticulitis.  Past Medical History:  Diagnosis Date  . Carpal tunnel syndrome   . Condylomata acuminata in male   . Coronary artery disease   . Diverticulitis   . GERD (gastroesophageal reflux disease)   . Gout   . Hyperlipidemia   . Hypertension   . Knee pain, bilateral   . Legal blindness   . Lumbar degenerative disc disease   . Obesity   . OSA (obstructive sleep apnea)   . Seizures ([place])   . Tobacco abuse   . Vitamin D deficiency     Past Surgical History:  Procedure Laterality Date  . BIOPSY  09/27/2016   Procedure: BIOPSY;  Surgeon: [name], MD;  [facility];  Service: Endoscopy;;  esophagus and gastric  . CORONARY ANGIOPLASTY  2016  . ESOPHAGOGASTRODUODENOSCOPY N/A 09/27/2016   Procedure: ESOPHAGOGASTRODUODENOSCOPY (EGD);  Surgeon: [name], MD;  [facility];  Service: Endoscopy;  Laterality: N/A;  300  . 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   Marland Kitchen SAVORY DILATION N/A 09/27/2016   Procedure: SAVORY DILATION;  Surgeon: [name], MD;  [facility];  Service: Endoscopy;  Laterality: N/A;    Prior to Admission medications   Medication Sig Start Date End Date Taking? Authorizing Provider  amLODipine (NORVASC) 10 MG tablet TAKE 1 TABLET (10 MG TOTAL) BY MOUTH DAILY. 09/25/15  Yes Bacigalupo, Dionne Bucy, MD  aspirin EC 81 MG tablet Take 81 mg by mouth daily.   Yes [provider]  atorvastatin (LIPITOR) 40 MG tablet Take 40 mg by mouth at bedtime.  03/04/16  Yes [provider]  docusate sodium (COLACE) 100 MG capsule Take 100 mg by mouth 2 (two) times daily as needed for mild constipation.    Yes [provider]  hydrochlorothiazide (HYDRODIURIL)  25 MG tablet TAKE 1 TABLET (25 MG TOTAL) BY MOUTH DAILY. Patient taking differently: Take 25 mg by mouth every Monday, Wednesday, and Friday.  10/27/15  Yes Bacigalupo, Dionne Bucy, MD  isosorbide mononitrate (IMDUR) 60 MG 24 hr tablet Take 1 tablet (60 mg total) by mouth daily. Patient taking differently: Take 30 mg by mouth daily.  07/22/16  Yes Herminio Commons, MD  KLOR-CON M20 20 MEQ tablet Take 20 mEq by mouth See admin instructions. Take 1 tablet twice daily on Mon, Wed, and Fri 03/04/16  Yes [provider]  metoprolol (LOPRESSOR) 100 MG tablet TAKE 1 TABLET (100 MG TOTAL) BY MOUTH 2 (TWO) TIMES DAILY. 06/25/14  Yes Bacigalupo, Dionne Bucy, MD  Na Sulfate-K Sulfate-Mg Sulf (SUPREP BOWEL PREP KIT) 17.5-3.13-1.6 GM/180ML SOLN Take 1 kit by mouth as directed. 12/28/16  Yes Tashira Torre, Marga Melnick, MD  NON FORMULARY Place 1 each into the nose at bedtime. CPAP   Yes [provider]  omeprazole (PRILOSEC) 40 MG capsule Take 1 capsule (40 mg total) by mouth daily. 02/10/14  Yes Bacigalupo, Dionne Bucy, MD  Vitamin D, Ergocalciferol, (DRISDOL) 50000 units CAPS capsule Take 50,000 Units by mouth 2 (two) times a week. Sunday and Wednesday   Yes [provider]  nitroGLYCERIN (NITROSTAT) 0.4 MG SL tablet Place 1 tablet (0.4 mg total) under the tongue every 5 (five) minutes as needed for chest  pain. 04/28/16   Herminio Commons, MD  torsemide (DEMADEX) 20 MG tablet TAKE 1 TABLET (20 MG TOTAL) BY MOUTH DAILY AS NEEDED (SWELLING). 01/22/14   Virginia Crews, MD    Allergies as of 12/28/2016 - Review Complete 12/28/2016  Allergen Reaction Noted  . Other Anaphylaxis 06/07/2011    Family History  Problem Relation Age of Onset  . High blood pressure Mother   . Cancer Maternal Grandmother   . Colon cancer Neg Hx     Social History   Social History  . Marital status: Married    Spouse name: N/A  . Number of children: N/A  . Years of education: N/A   Occupational History  . shipping  and recieving     employer: [employer]    Social History Main Topics  . Smoking status: Former Smoker    Packs/day: 0.50    Types: Cigarettes    Quit date: 04/28/2014  . Smokeless tobacco: Never Used     Comment: decreased smoking  . Alcohol use No  . Drug use: No  . Sexual activity: Not on file   Other Topics Concern  . Not on file   Social History Narrative   Pt currently lives with brother and sister in law     Review of Systems: See HPI, otherwise negative ROS   Physical Exam: BP 127/85   Pulse 89   Temp 98.1 F (36.7 C) (Oral)   Resp (!) 22   Ht 5' 10"  (1.778 m)   Wt 293 lb (132.9 kg)   SpO2 100%   BMI 42.04 kg/m  General:   Alert,  pleasant and cooperative in NAD Head:  Normocephalic and atraumatic. Neck:  Supple; Lungs:  Clear throughout to auscultation.    Heart:  Regular rate and rhythm. Abdomen:  Soft, nontender and nondistended. Normal bowel sounds, without guarding, and without rebound.   Neurologic:  Alert and  oriented x4;  grossly normal neurologically.  Impression/Plan:    Rectal pain/diverticulitis  Plan: 1. TCS TODAY DISCUSSED PROCEDURE, BENEFITS, & RISKS: < 1% chance of medication reaction, bleeding, perforation, or rupture of spleen/liver.

## 2017-01-24 NOTE — Op Note (Addendum)
[facility] Patient Name: Mike Davenport Procedure Date: 01/24/2017 12:07 PM MRN: [id number] Date of Birth: 1970-10-13 Attending MD: Barney Drain MD, MD CSN: [id number] Age: 46 Admit Type: Outpatient Procedure:                Colonoscopy WITH COLD FORCEPS/SNARE AND SNARE                            CAUTERY POLYPECTOMY Indications:              Screening for colorectal malignant neoplasm Providers:                Barney Drain MD, MD, Lurline Del, RN, Randa Spike, Technician Referring MD:             Octavio Graves MD, MD Medicines:                Ondansetron 4 mg IV, Meperidine 100 mg IV,                            Midazolam 6 mg IV Complications:            No immediate complications. Estimated Blood Loss:     Estimated blood loss was minimal. Procedure:                Pre-Anesthesia Assessment:                           - Prior to the procedure, a History and Physical                            was performed, and patient medications and                            allergies were reviewed. The patient's tolerance of                            previous anesthesia was also reviewed. The risks                            and benefits of the procedure and the sedation                            options and risks were discussed with the patient.                            All questions were answered, and informed consent                            was obtained. Prior Anticoagulants: The patient has                            taken aspirin, last dose was day of procedure. ASA  Grade Assessment: II - A patient with mild systemic                            disease. After reviewing the risks and benefits,                            the patient was deemed in satisfactory condition to                            undergo the procedure. After obtaining informed                            consent, the colonoscope was passed under direct                             vision. Throughout the procedure, the patient's                            blood pressure, pulse, and oxygen saturations were                            monitored continuously. The EC-[id number] ([id number])                            scope was introduced through the anus and advanced                            to the the cecum, identified by appendiceal orifice                            and ileocecal valve. The colonoscopy was somewhat                            difficult due to a tortuous colon. Successful                            completion of the procedure was aided by increasing                            the dose of sedation medication and COLOWRAP. The                            patient tolerated the procedure fairly well. The                            quality of the bowel preparation was good. The                            ileocecal valve, appendiceal orifice, and rectum                            were photographed. Scope In: 1:16:30 PM Scope Out: 1:36:05 PM Scope Withdrawal Time: 0 hours 17 minutes 35  seconds  Total Procedure Duration: 0 hours 19 minutes 35 seconds  Findings:      A 6 mm polyp was found in the ascending colon. The polyp was sessile.       The polyp was removed with a cold snare. Resection and retrieval were       complete.      A 10 mm polyp was found in the mid transverse colon. The polyp was       sessile. The polyp was removed with a hot snare. Resection and retrieval       were complete.      Five sessile polyps were found in the sigmoid colon(BTL #2). The polyps       were 2 to 4 mm in size. These polyps were removed with a cold biopsy       forceps. Resection and retrieval were complete. Impression:               - One 6 mm polyp in the ascending colon, removed                            with a cold snare. Resected and retrieved.                           - One 10 mm polyp in the mid transverse colon,                             removed with a hot snare. Resected and retrieved.                           - Five 2 to 4 mm polyps in the sigmoid colon,                            removed with a cold biopsy forceps. Resected and                            retrieved. Moderate Sedation:      Moderate (conscious) sedation was administered by the endoscopy nurse       and supervised by the endoscopist. The following parameters were       monitored: oxygen saturation, heart rate, blood pressure, and response       to care. Total physician intraservice time was 32 minutes. Recommendation:           - Await pathology results.                           - Repeat colonoscopy in 3 - 5 years for                            surveillance.                           - High fiber diet.                           - Patient has a contact number available for  emergencies. The signs and symptoms of potential                            delayed complications were discussed with the                            patient. Return to normal activities tomorrow.                            Written discharge instructions were provided to the                            patient.                           - Continue present medications. Procedure Code(s):        --- Professional ---                           [phone], Colonoscopy, flexible; with removal of                            tumor(s), polyp(s), or other lesion(s) by snare                            technique                           45380, 59, Colonoscopy, flexible; with biopsy,                            single or multiple                           99152, Moderate sedation services provided by the                            same physician or other qualified health care                            professional performing the diagnostic or                            therapeutic service that the sedation supports,                            requiring the presence of an  independent trained                            observer to assist in the monitoring of the                            patient's level of consciousness and physiological                            status; initial 15 minutes of  intraservice time,                            patient age 66 years or older                           [phone], Moderate sedation services; each additional                            15 minutes intraservice time Diagnosis Code(s):        --- Professional ---                           Z12.11, Encounter for screening for malignant                            neoplasm of colon                           D12.2, Benign neoplasm of ascending colon                           D12.3, Benign neoplasm of transverse colon (hepatic                            flexure or splenic flexure)                           D12.5, Benign neoplasm of sigmoid colon CPT copyright 2016 [employer]. All rights reserved. The codes documented in this report are preliminary and upon coder review may  be revised to meet current compliance requirements. Barney Drain, MD Barney Drain MD, MD 01/24/2017 1:55:18 PM This report has been signed electronically. Number of Addenda: 0

## 2017-01-24 NOTE — Discharge Instructions (Signed)
You have small internal AND MODERATE EXTERNAL HEMORRHOIDS and diverticulosis IN YOUR LEFT AND RIGHT COLON. YOU HAD SEVEN POLYPS REMOVED.   DRINK WATER TO KEEP YOUR URINE LIGHT YELLOW.  FOLLOW A HIGH FIBER DIET. AVOID ITEMS THAT CAUSE BLOATING.   CONTINUE YOUR WEIGHT LOSS EFFORTS.    YOUR BIOPSY RESULTS WILL BE AVAILABLE IN MY CHART AFTER OCT 4 AND MY OFFICE WILL CONTACT YOU IN 10-14 DAYS WITH YOUR RESULTS.   Next colonoscopy in 3-5 years.  Colonoscopy Care After Read the instructions outlined below and refer to this sheet in the next week. These discharge instructions provide you with general information on caring for yourself after you leave the hospital. While your treatment has been planned according to the most current medical practices available, unavoidable complications occasionally occur. If you have any problems or questions after discharge, call DR. Aaleah Hirsch, [phone].  ACTIVITY  You may resume your regular activity, but move at a slower pace for the next 24 hours.   Take frequent rest periods for the next 24 hours.   Walking will help get rid of the air and reduce the bloated feeling in your belly (abdomen).   No driving for 24 hours (because of the medicine (anesthesia) used during the test).   You may shower.   Do not sign any important legal documents or operate any machinery for 24 hours (because of the anesthesia used during the test).    NUTRITION  Drink plenty of fluids.   You may resume your normal diet as instructed by your doctor.   Begin with a light meal and progress to your normal diet. Heavy or fried foods are harder to digest and may make you feel sick to your stomach (nauseated).   Avoid alcoholic beverages for 24 hours or as instructed.    MEDICATIONS  You may resume your normal medications.   WHAT YOU CAN EXPECT TODAY  Some feelings of bloating in the abdomen.   Passage of more gas than usual.   Spotting of blood in your stool or on  the toilet paper  .  IF YOU HAD POLYPS REMOVED DURING THE COLONOSCOPY:  Eat a soft diet IF YOU HAVE NAUSEA, BLOATING, ABDOMINAL PAIN, OR VOMITING.    FINDING OUT THE RESULTS OF YOUR TEST Not all test results are available during your visit. [name] WILL CALL YOU WITHIN 14 DAYS OF YOUR PROCEDUE WITH YOUR RESULTS. Do not assume everything is normal if you have not heard from DR. Stokely Jeancharles, CALL HER OFFICE AT [phone].  SEEK IMMEDIATE MEDICAL ATTENTION AND CALL THE OFFICE: [phone] IF:  You have more than a spotting of blood in your stool.   Your belly is swollen (abdominal distention).   You are nauseated or vomiting.   You have a temperature over 101F.   You have abdominal pain or discomfort that is severe or gets worse throughout the day.  High-Fiber Diet A high-fiber diet changes your normal diet to include more whole grains, legumes, fruits, and vegetables. Changes in the diet involve replacing refined carbohydrates with unrefined foods. The calorie level of the diet is essentially unchanged. The Dietary Reference Intake (recommended amount) for adult males is 38 grams per day. For adult females, it is 25 grams per day. Pregnant and lactating women should consume 28 grams of fiber per day. Fiber is the intact part of a plant that is not broken down during digestion. Functional fiber is fiber that has been isolated from the plant to provide a beneficial  effect in the body. PURPOSE  Increase stool bulk.   Ease and regulate bowel movements.   Lower cholesterol.   REDUCE RISK OF COLON CANCER  INDICATIONS THAT YOU NEED MORE FIBER  Constipation and hemorrhoids.   Uncomplicated diverticulosis (intestine condition) and irritable bowel syndrome.   Weight management.   As a protective measure against hardening of the arteries (atherosclerosis), diabetes, and cancer.   GUIDELINES FOR INCREASING FIBER IN THE DIET  Start adding fiber to the diet slowly. A gradual increase  of about 5 more grams (2 slices of whole-wheat bread, 2 servings of most fruits or vegetables, or 1 bowl of high-fiber cereal) per day is best. Too rapid an increase in fiber may result in constipation, flatulence, and bloating.   Drink enough water and fluids to keep your urine clear or pale yellow. Water, juice, or caffeine-free drinks are recommended. Not drinking enough fluid may cause constipation.   Eat a variety of high-fiber foods rather than one type of fiber.   Try to increase your intake of fiber through using high-fiber foods rather than fiber pills or supplements that contain small amounts of fiber.   The goal is to change the types of food eaten. Do not supplement your present diet with high-fiber foods, but replace foods in your present diet.   INCLUDE A VARIETY OF FIBER SOURCES  Replace refined and processed grains with whole grains, canned fruits with fresh fruits, and incorporate other fiber sources. White rice, white breads, and most bakery goods contain little or no fiber.   Brown whole-grain rice, buckwheat oats, and many fruits and vegetables are all good sources of fiber. These include: broccoli, Brussels sprouts, cabbage, cauliflower, beets, sweet potatoes, white potatoes (skin on), carrots, tomatoes, eggplant, squash, berries, fresh fruits, and dried fruits.   Cereals appear to be the richest source of fiber. Cereal fiber is found in whole grains and bran. Bran is the fiber-rich outer coat of cereal grain, which is largely removed in refining. In whole-grain cereals, the bran remains. In breakfast cereals, the largest amount of fiber is found in those with "bran" in their names. The fiber content is sometimes indicated on the label.   You may need to include additional fruits and vegetables each day.   In baking, for 1 cup white flour, you may use the following substitutions:   1 cup whole-wheat flour minus 2 tablespoons.   1/2 cup white flour plus 1/2 cup whole-wheat  flour.   Polyps, Colon  A polyp is extra tissue that grows inside your body. Colon polyps grow in the large intestine. The large intestine, also called the colon, is part of your digestive system. It is a long, hollow tube at the end of your digestive tract where your body makes and stores stool. Most polyps are not dangerous. They are benign. This means they are not cancerous. But over time, some types of polyps can turn into cancer. Polyps that are smaller than a pea are usually not harmful. But larger polyps could someday become or may already be cancerous. To be safe, doctors remove all polyps and test them.   WHO GETS POLYPS? Anyone can get polyps, but certain people are more likely than others. You may have a greater chance of getting polyps if:  You are over 50.   You have had polyps before.   Someone in your family has had polyps.   Someone in your family has had cancer of the large intestine.   Find out if  someone in your family has had polyps. You may also be more likely to get polyps if you:   Eat a lot of fatty foods   Smoke   Drink alcohol   Do not exercise  Eat too much     PREVENTION There is not one sure way to prevent polyps. You might be able to lower your risk of getting them if you:  Eat more fruits and vegetables and less fatty food.   Do not smoke.   Avoid alcohol.   Exercise every day.   Lose weight if you are overweight.   Eating more calcium and folate can also lower your risk of getting polyps. Some foods that are rich in calcium are milk, cheese, and broccoli. Some foods that are rich in folate are chickpeas, kidney beans, and spinach.    Diverticulosis Diverticulosis is a common condition that develops when small pouches (diverticula) form in the wall of the colon. The risk of diverticulosis increases with age. It happens more often in people who eat a low-fiber diet. Most individuals with diverticulosis have no symptoms. Those individuals with  symptoms usually experience belly (abdominal) pain, constipation, or loose stools (diarrhea).  HOME CARE INSTRUCTIONS  Increase the amount of fiber in your diet as directed by your caregiver or dietician. This may reduce symptoms of diverticulosis.   Drink at least 6 to 8 glasses of water each day to prevent constipation.   Try not to strain when you have a bowel movement.   Avoiding nuts and seeds to prevent complications is NOT NECESSARY.   FOODS HAVING HIGH FIBER CONTENT INCLUDE:  Fruits. Apple, peach, pear, tangerine, raisins, prunes.   Vegetables. Brussels sprouts, asparagus, broccoli, cabbage, carrot, cauliflower, romaine lettuce, spinach, summer squash, tomato, winter squash, zucchini.   Starchy Vegetables. Baked beans, kidney beans, lima beans, split peas, lentils, potatoes (with skin).   Grains. Whole wheat bread, brown rice, bran flake cereal, plain oatmeal, white rice, shredded wheat, bran muffins.    SEEK IMMEDIATE MEDICAL CARE IF:  You develop increasing pain or severe bloating.   You have an oral temperature above 101F.   You develop vomiting or bowel movements that are bloody or black.   Hemorrhoids Hemorrhoids are dilated (enlarged) veins around the rectum. Sometimes clots will form in the veins. This makes them swollen and painful. These are called thrombosed hemorrhoids. Causes of hemorrhoids include:  Constipation.   Straining to have a bowel movement.   HEAVY LIFTING  HOME CARE INSTRUCTIONS  Eat a well balanced diet and drink 6 to 8 glasses of water every day to avoid constipation. You may also use a bulk laxative.   Avoid straining to have bowel movements.   Keep anal area dry and clean.   Do not use a donut shaped pillow or sit on the toilet for long periods. This increases blood pooling and pain.   Move your bowels when your body has the urge; this will require less straining and will decrease pain and pressure.

## 2017-01-26 ENCOUNTER — Encounter (HOSPITAL_COMMUNITY): Payer: Self-pay | Admitting: Gastroenterology

## 2017-02-02 ENCOUNTER — Telehealth: Payer: Self-pay | Admitting: Gastroenterology

## 2017-02-02 NOTE — Telephone Encounter (Signed)
Please call pt. He had SEVEN simple adenomas removed.   DRINK WATER TO KEEP YOUR URINE LIGHT YELLOW.  FOLLOW A HIGH FIBER DIET. AVOID ITEMS THAT CAUSE BLOATING.   CONTINUE YOUR WEIGHT LOSS EFFORTS.    Next colonoscopy in 3 years. YOUR SISTERS, BROTHERS, CHILDREN, AND PARENTS NEED TO HAVE A COLONOSCOPY STARTING AT THE AGE OF 40.

## 2017-02-03 NOTE — Telephone Encounter (Signed)
LMOM to call.

## 2017-02-03 NOTE — Telephone Encounter (Signed)
Reminder in epic °

## 2017-02-07 NOTE — Telephone Encounter (Signed)
Pt is aware.  

## 2017-02-15 ENCOUNTER — Ambulatory Visit (INDEPENDENT_AMBULATORY_CARE_PROVIDER_SITE_OTHER): Payer: Medicare Other | Admitting: Nurse Practitioner

## 2017-02-15 ENCOUNTER — Encounter: Payer: Self-pay | Admitting: Nurse Practitioner

## 2017-02-15 DIAGNOSIS — I25118 Atherosclerotic heart disease of native coronary artery with other forms of angina pectoris: Secondary | ICD-10-CM | POA: Diagnosis not present

## 2017-02-15 DIAGNOSIS — K5732 Diverticulitis of large intestine without perforation or abscess without bleeding: Secondary | ICD-10-CM | POA: Diagnosis not present

## 2017-02-15 NOTE — Assessment & Plan Note (Addendum)
Follow-up for recent admission for acute diverticulitis requiring hospitalization as an inpatient at [facility]. He has done well status post discharge and is currently asymptomatic. This is his second CT confirmed episode of sigmoid diverticulitis. Likely had a previous episode last year he did not present for treatment because his pain was not that severe. Given his repeated diverticulitis, I discussed possible referral to surgeon for opinion on possible sigmoid colectomy. He is wanting to hold off for right now. We will have him follow-up in 3 months, call if any worsening or recurrent symptoms. I will print further information for him related to diverticulitis. If he has another occurrence they will revisit the idea about possible surgical referral.  At least 25 minutes was spent with the patient of which 50% or more was spent in care coordination, education/teaching related to pathophysiology, recurrence of diverticulitis, possible treatment options moving forward.

## 2017-02-15 NOTE — Patient Instructions (Addendum)
1. Continue your current medications. 2. Return for follow-up in 3 months. 3. Call if you have any worsening or recurrent symptoms before then. 4. Call us if you have any questions or concerns.     Diverticulitis Diverticulitis is inflammation or infection of small pouches in your colon that form when you have a condition called diverticulosis. The pouches in your colon are called diverticula. Your colon, or large intestine, is where water is absorbed and stool is formed. Complications of diverticulitis can include:  Bleeding.  Severe infection.  Severe pain.  Perforation of your colon.  Obstruction of your colon.  What are the causes? Diverticulitis is caused by bacteria. Diverticulitis happens when stool becomes trapped in diverticula. This allows bacteria to grow in the diverticula, which can lead to inflammation and infection. What increases the risk? People with diverticulosis are at risk for diverticulitis. Eating a diet that does not include enough fiber from fruits and vegetables may make diverticulitis more likely to develop. What are the signs or symptoms? Symptoms of diverticulitis may include:  Abdominal pain and tenderness. The pain is normally located on the left side of the abdomen, but may occur in other areas.  Fever and chills.  Bloating.  Cramping.  Nausea.  Vomiting.  Constipation.  Diarrhea.  Blood in your stool.  How is this diagnosed? Your health care provider will ask you about your medical history and do a physical exam. You may need to have tests done because many medical conditions can cause the same symptoms as diverticulitis. Tests may include:  Blood tests.  Urine tests.  Imaging tests of the abdomen, including X-rays and CT scans.  When your condition is under control, your health care provider may recommend that you have a colonoscopy. A colonoscopy can show how severe your diverticula are and whether something else is causing  your symptoms. How is this treated? Most cases of diverticulitis are mild and can be treated at home. Treatment may include:  Taking over-the-counter pain medicines.  Following a clear liquid diet.  Taking antibiotic medicines by mouth for 7-10 days.  More severe cases may be treated at a hospital. Treatment may include:  Not eating or drinking.  Taking prescription pain medicine.  Receiving antibiotic medicines through an IV tube.  Receiving fluids and nutrition through an IV tube.  Surgery.  Follow these instructions at home:  Follow your health care provider's instructions carefully.  Follow a full liquid diet or other diet as directed by your health care provider. After your symptoms improve, your health care provider may tell you to change your diet. He or she may recommend you eat a high-fiber diet. Fruits and vegetables are good sources of fiber. Fiber makes it easier to pass stool.  Take fiber supplements or probiotics as directed by your health care provider.  Only take medicines as directed by your health care provider.  Keep all your follow-up appointments. Contact a health care provider if:  Your pain does not improve.  You have a hard time eating food.  Your bowel movements do not return to normal. Get help right away if:  Your pain becomes worse.  Your symptoms do not get better.  Your symptoms suddenly get worse.  You have a fever.  You have repeated vomiting.  You have bloody or black, tarry stools. This information is not intended to replace advice given to you by your health care provider. Make sure you discuss any questions you have with your health care provider. Document  Released: 01/20/2005 Document Revised: 09/18/2015 Document Reviewed: 03/07/2013 Elsevier Interactive Patient Education  2017 [employer].

## 2017-02-15 NOTE — Progress Notes (Signed)
Referring Provider: Octavio Graves, DO Primary Care Physician:  Octavio Graves, DO Primary GI:  [name]  Chief Complaint  Patient presents with  . Diverticulitis    doing well    HPI:   Mike Davenport is a 46 y.o. male who presents  Her post-hospitalization follow-p related to admission at [facility]. The patient was last seen in our office 12/28/2016 for GERD, rectal pain, abnormal CT scan of the colon. Recent EGD completed with esophageal biopsies, gastritis but no H. pylori likely due to GERD. As last visit he complained of new onset rectal pain which is intermittent and unrelated to bowel function, occasional straining for bowel movements. No bleeding or abdominal pain. Diagnosed with diverticulitis at [facility] in March 2018. CT of the abdomen and pelvis report was received which noted severe sigmoid colon diverticulosis with superimposed segment of pericolonic fat stranding, severe canal stenosis at multiple lumbar levels. No prior colonoscopy. Recommended first-ever colonoscopy for evaluation. Continue omeprazole and weight loss efforts.  Colonoscopy completed 01/24/2017 which found a 6 mm polyp in the ascending colon which was sessile, 10 mm polyp in the transverse colon which was sessile, five sessile polyps in the sigmoid colon ranging from 2-4 mm in size. Surgical pathology report reviewed. The polyps are found to be tubular adenoma. Recommended repeat colonoscopy in 3 years (2021). Recommended all primary family members have first colonoscopy starting at age 38.  The patient was recently admitted to [facility] for acute diverticulitis. Hospitalist note reviewed. He presented with significant abdominal pain which woke him in the middle the night and a fever of 102.2. Decreased appetite over the previous 3 months. CT imaging results reviewed and demonstrated acute sigmoid diverticulitis with extensive phlegmonous inflammation in the mid sigmoid mesocolon  without discrete diverticular abscess or definitive signs of frank perforation noted. He was admitted 01/01/2017 and treated with Cipro and Flagyl.  Today he states he's doing well currently. No further abdominal pain. Denies N/V, hematochezia, melena, fever, chills, unintentional weight loss, acute change sin bowel habits. Denies chest pain, dyspnea, dizziness, lightheadedness, syncope, near syncope. Denies any other upper or lower GI symptoms.  Past Medical History:  Diagnosis Date  . Carpal tunnel syndrome   . Condylomata acuminata in male   . Coronary artery disease   . Diverticulitis   . GERD (gastroesophageal reflux disease)   . Gout   . Hyperlipidemia   . Hypertension   . Knee pain, bilateral   . Legal blindness   . Lumbar degenerative disc disease   . Obesity   . OSA (obstructive sleep apnea)   . Seizures ([place])   . Tobacco abuse   . Vitamin D deficiency     Past Surgical History:  Procedure Laterality Date  . BIOPSY  09/27/2016   Procedure: BIOPSY;  Surgeon: [name], MD;  [facility];  Service: Endoscopy;;  esophagus and gastric  . COLONOSCOPY N/A 01/24/2017   Procedure: COLONOSCOPY;  Surgeon: [name], MD;  [facility];  Service: Endoscopy;  Laterality: N/A;  1:00pm  . CORONARY ANGIOPLASTY  2016  . ESOPHAGOGASTRODUODENOSCOPY N/A 09/27/2016   Procedure: ESOPHAGOGASTRODUODENOSCOPY (EGD);  Surgeon: [name], MD;  [facility];  Service: Endoscopy;  Laterality: N/A;  300  . 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   Marland Kitchen SAVORY DILATION N/A 09/27/2016   Procedure: SAVORY DILATION;  Surgeon: [name], MD;  [facility];  Service: Endoscopy;  Laterality: N/A;    Current Outpatient Prescriptions  Medication Sig Dispense Refill  . amLODipine (NORVASC) 10 MG tablet TAKE 1 TABLET (10 MG TOTAL) BY MOUTH DAILY. 30 tablet 0  . aspirin EC 81 MG  tablet Take 81 mg by mouth daily.    Marland Kitchen atorvastatin (LIPITOR) 40 MG tablet Take 40 mg by mouth at bedtime.   2  . docusate sodium (COLACE) 100 MG capsule Take 100 mg by mouth 2 (two) times daily as needed for mild constipation.     . hydrochlorothiazide (HYDRODIURIL) 25 MG tablet TAKE 1 TABLET (25 MG TOTAL) BY MOUTH DAILY. (Patient taking differently: Take 25 mg by mouth every Monday, Wednesday, and Friday. ) 30 tablet 0  . isosorbide mononitrate (IMDUR) 60 MG 24 hr tablet Take 1 tablet (60 mg total) by mouth daily. 90 tablet 3  . KLOR-CON M20 20 MEQ tablet Take 20 mEq by mouth See admin instructions. Take 1 tablet twice daily on Mon, Wed, and Fri  11  . metoprolol (LOPRESSOR) 100 MG tablet TAKE 1 TABLET (100 MG TOTAL) BY MOUTH 2 (TWO) TIMES DAILY. 60 tablet 6  . nitroGLYCERIN (NITROSTAT) 0.4 MG SL tablet Place 1 tablet (0.4 mg total) under the tongue every 5 (five) minutes as needed for chest pain. 25 tablet 3  . NON FORMULARY Place 1 each into the nose at bedtime. CPAP    . omeprazole (PRILOSEC) 40 MG capsule Take 1 capsule (40 mg total) by mouth daily. 30 capsule 6  . torsemide (DEMADEX) 20 MG tablet TAKE 1 TABLET (20 MG TOTAL) BY MOUTH DAILY AS NEEDED (SWELLING). 30 tablet 3  . Vitamin D, Ergocalciferol, (DRISDOL) 50000 units CAPS capsule Take 50,000 Units by mouth 2 (two) times a week. Sunday and Wednesday     No current facility-administered medications for this visit.     Allergies as of 02/15/2017 - Review Complete 02/15/2017  Allergen Reaction Noted  . Other Anaphylaxis 06/07/2011    Family History  Problem Relation Age of Onset  . High blood pressure Mother   . Cancer Maternal Grandmother   . Colon cancer Neg Hx     Social History   Social History  . Marital status: Married    Spouse name: N/A  . Number of children: N/A  . Years of education: N/A   Occupational History  . shipping and recieving     employer: [employer]    Social History Main Topics  . Smoking status:  Former Smoker    Packs/day: 0.50    Types: Cigarettes    Quit date: 04/28/2014  . Smokeless tobacco: Never Used     Comment: decreased smoking  . Alcohol use No  . Drug use: No  . Sexual activity: Not Asked   Other Topics Concern  . None   Social History Narrative   Pt currently lives with brother and sister in law     Review of Systems: Complete ROS negative except as per HPI.   Physical Exam: BP (!) 131/92   Pulse 73   Temp 97.8 F (36.6 C) (Oral)   Ht 5\' 10"  (1.778 m)   Wt 287 lb 9.6 oz (130.5 kg)   BMI 41.27 kg/m  General:   Obese male. Alert and oriented. Pleasant and cooperative. Well-nourished and well-developed.  Eyes:  Without icterus, sclera clear and conjunctiva pink.  Ears:  Normal auditory acuity. Cardiovascular:  S1, S2 present without  murmurs appreciated. Extremities without clubbing or edema. Respiratory:  Clear to auscultation bilaterally. No wheezes, rales, or rhonchi. No distress.  Gastrointestinal:  +BS, obese but soft, non-tender and non-distended. No HSM noted. No guarding or rebound. No masses appreciated.  Rectal:  Deferred  Musculoskalatal:  Symmetrical without gross deformities. Neurologic:  Alert and oriented x4;  grossly normal neurologically. Psych:  Alert and cooperative. Normal mood and affect. Heme/Lymph/Immune: No excessive bruising noted.    02/15/2017 11:27 AM   Disclaimer: This note was dictated with voice recognition software. Similar sounding words can inadvertently be transcribed and may not be corrected upon review.

## 2017-05-18 ENCOUNTER — Encounter: Payer: Self-pay | Admitting: Nurse Practitioner

## 2017-05-18 ENCOUNTER — Other Ambulatory Visit: Payer: Self-pay | Admitting: *Deleted

## 2017-05-18 ENCOUNTER — Ambulatory Visit (INDEPENDENT_AMBULATORY_CARE_PROVIDER_SITE_OTHER): Payer: Medicare Other | Admitting: Nurse Practitioner

## 2017-05-18 VITALS — BP 122/78 | HR 71 | Temp 97.6°F | Ht 70.0 in | Wt 298.6 lb

## 2017-05-18 DIAGNOSIS — K5732 Diverticulitis of large intestine without perforation or abscess without bleeding: Secondary | ICD-10-CM

## 2017-05-18 DIAGNOSIS — K219 Gastro-esophageal reflux disease without esophagitis: Secondary | ICD-10-CM

## 2017-05-18 MED ORDER — PANTOPRAZOLE SODIUM 40 MG PO TBEC: 40.0000 mg | DELAYED_RELEASE_TABLET | Freq: Every day | ORAL | 3 refills | Status: DC

## 2017-05-18 NOTE — Assessment & Plan Note (Signed)
GERD symptoms are currently moderately well managed.  He has been on omeprazole for some time and still has at least once a week breakthrough symptoms.  At this point I have him stop omeprazole and start Protonix 40 mg daily.  Return for follow-up in 6 months.

## 2017-05-18 NOTE — Assessment & Plan Note (Signed)
The patient has had multiple episodes of diverticulitis.  He has had approximately 4 episodes within the past year, at least 3 of which are confirmed by CT imaging.  He has been evaluated in the emergency department and during hospitalization for each of these episodes at [facility].  The most recent episode occurred in mid December 2018.  He had significant abdominal pain and was seen in the emergency room and had diverticulitis in the sigmoid colon based on CT imaging, per the patient.  He was treated with Cipro and Flagyl and improved as he typically does.  It appears that all of his episodes of diverticulitis have been in the sigmoid colon.  Given his frequent recurrences, I again recommended he see a surgeon for evaluation.  He is amendable to this at this time.  We will make the referral to [facility].  Return for follow-up in 6 months.  Colonoscopy is up-to-date 01/24/2017.

## 2017-05-18 NOTE — Progress Notes (Signed)
Referring Provider: Octavio Graves, DO Primary Care Physician:  Octavio Graves, DO Primary GI:  [name]  Chief Complaint  Patient presents with  . diverticulitis    Was seen at [facility] with diverticulitis in 04/09/17.    HPI:   Mike Davenport is a 47 y.o. male who presents for follow-up on diarrhea.  The patient was last seen in for diverticulitis.  Previous diverticulitis diagnosed at [facility] in March 2018 with CT of the abdomen and pelvis noting severe sigmoid colon diverticulosis with superimposed segment of pericolonic fat stranding, severe canal stenosis at multiple lumbar levels.  Colonoscopy up-to-date 01/24/2017 and outlined as per below.  Noted recent admission to [facility] again for acute diverticulitis for which she presented with abdominal pain that woke him in the middle the night and a fever of 102.2.  Decreased appetite over 3 months.  CT imaging demonstrated acute sigmoid diverticulitis with extensive phlegmonous inflammation in the mid sigmoid mesocolon without discrete diverticular abscess or definitive signs of frank perforation noted.  He was treated with Cipro and Flagyl.  At his last visit he was doing well overall, no further symptoms.  We did discuss possible surgical referral due to repeated diverticulitis, at his last visit.  He was wanting to hold off at that time. Recommended continue current medications, follow-up in 3 months, notify us of any worsening symptoms before then.  Colonoscopy completed 01/24/2017 which found a 6 mm polyp in the ascending colon which was sessile, 10 mm polyp in the transverse colon which was sessile, five sessile polyps in the sigmoid colon ranging from 2-4 mm in size. Surgical pathology report reviewed. The polyps are found to be tubular adenoma. Recommended repeat colonoscopy in 3 years (2021). Recommended all primary family members have first colonoscopy starting at age 53.  Today he states he's doing better.  Was again seen at [place] 12/15 and diagnosed with CT confirmed diverticulitis (per the patient). Was having significant pain which trigger his presentation. He was treated with Cipro Flagyl antibiotics which he took to completion. Currently no abdominal pain. Occasional nausea, no vomiting. GERD symptoms moderately well controlled, has been taking omeprazole for "a while." Is having breakthrough symptoms about once a week. Denies hematochezia, melena, fever, chills, unintentional weight loss. Denies chest pain, dyspnea, dizziness, lightheadedness, syncope, near syncope. Denies any other upper or lower GI symptoms.  Past Medical History:  Diagnosis Date  . Carpal tunnel syndrome   . Condylomata acuminata in male   . Coronary artery disease   . Diverticulitis   . GERD (gastroesophageal reflux disease)   . Gout   . Hyperlipidemia   . Hypertension   . Knee pain, bilateral   . Legal blindness   . Lumbar degenerative disc disease   . Obesity   . OSA (obstructive sleep apnea)   . Seizures ([place])   . Tobacco abuse   . Vitamin D deficiency     Past Surgical History:  Procedure Laterality Date  . BIOPSY  09/27/2016   Procedure: BIOPSY;  Surgeon: [name], MD;  [facility];  Service: Endoscopy;;  esophagus and gastric  . COLONOSCOPY N/A 01/24/2017   Procedure: COLONOSCOPY;  Surgeon: [name], MD;  [facility];  Service: Endoscopy;  Laterality: N/A;  1:00pm  . CORONARY ANGIOPLASTY  2016  . ESOPHAGOGASTRODUODENOSCOPY N/A 09/27/2016   Procedure: ESOPHAGOGASTRODUODENOSCOPY (EGD);  Surgeon: [name], MD;  [facility];  Service: Endoscopy;  Laterality: N/A;  300  . 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   Marland Kitchen SAVORY DILATION N/A 09/27/2016   Procedure: SAVORY DILATION;  Surgeon: [name], MD;  [facility];  Service: Endoscopy;  Laterality: N/A;     Current Outpatient Medications  Medication Sig Dispense Refill  . amLODipine (NORVASC) 10 MG tablet TAKE 1 TABLET (10 MG TOTAL) BY MOUTH DAILY. 30 tablet 0  . aspirin EC 81 MG tablet Take 81 mg by mouth daily.    Marland Kitchen atorvastatin (LIPITOR) 40 MG tablet Take 40 mg by mouth at bedtime.   2  . docusate sodium (COLACE) 100 MG capsule Take 100 mg by mouth 2 (two) times daily as needed for mild constipation.     . hydrochlorothiazide (HYDRODIURIL) 25 MG tablet TAKE 1 TABLET (25 MG TOTAL) BY MOUTH DAILY. (Patient taking differently: Take 25 mg by mouth every Monday, Wednesday, and Friday. ) 30 tablet 0  . isosorbide mononitrate (IMDUR) 60 MG 24 hr tablet Take 1 tablet (60 mg total) by mouth daily. 90 tablet 3  . KLOR-CON M20 20 MEQ tablet Take 20 mEq by mouth See admin instructions. Take 1 tablet twice daily on Mon, Wed, and Fri  11  . metoprolol (LOPRESSOR) 100 MG tablet TAKE 1 TABLET (100 MG TOTAL) BY MOUTH 2 (TWO) TIMES DAILY. 60 tablet 6  . nitroGLYCERIN (NITROSTAT) 0.4 MG SL tablet Place 1 tablet (0.4 mg total) under the tongue every 5 (five) minutes as needed for chest pain. 25 tablet 3  . NON FORMULARY Place 1 each into the nose at bedtime. CPAP    . omeprazole (PRILOSEC) 40 MG capsule Take 1 capsule (40 mg total) by mouth daily. 30 capsule 6  . torsemide (DEMADEX) 20 MG tablet TAKE 1 TABLET (20 MG TOTAL) BY MOUTH DAILY AS NEEDED (SWELLING). 30 tablet 3  . Vitamin D, Ergocalciferol, (DRISDOL) 50000 units CAPS capsule Take 50,000 Units by mouth 2 (two) times a week. Sunday and Wednesday     No current facility-administered medications for this visit.     Allergies as of 05/18/2017 - Review Complete 05/18/2017  Allergen Reaction Noted  . Other Anaphylaxis 06/07/2011    Family History  Problem Relation Age of Onset  . High blood pressure Mother   . Cancer Maternal Grandmother   . Colon cancer Neg Hx     Social History   Socioeconomic History  . Marital status: Married    Spouse  name: None  . Number of children: None  . Years of education: None  . Highest education level: None  Social Needs  . Financial resource strain: None  . Food insecurity - worry: None  . Food insecurity - inability: None  . Transportation needs - medical: None  . Transportation needs - non-medical: None  Occupational History  . Occupation: shipping and recieving    Comment: employer: [employer]   Tobacco Use  . Smoking status: Former Smoker    Packs/day: 0.50    Types: Cigarettes    Last attempt to quit: 04/28/2014    Years since quitting: 3.0  . Smokeless tobacco: Never Used  . Tobacco comment: decreased smoking  Substance and Sexual Activity  . Alcohol use: No  . Drug use: No  . Sexual activity: None  Other Topics Concern  . None  Social History Narrative   Pt currently lives with brother and sister in [place]  Review of Systems: Complete ROS negative except as per HPI.   Physical Exam: BP 122/78   Pulse 71   Temp 97.6 F (36.4 C) (Oral)   Ht 5\' 10"  (1.778 m)   Wt 298 lb 9.6 oz (135.4 kg)   BMI 42.84 kg/m  General:   Obese male. Alert and oriented. Pleasant and cooperative. Well-nourished and well-developed.  Eyes:  Without icterus, sclera clear and conjunctiva pink.  Ears:  Normal auditory acuity. Cardiovascular:  S1, S2 present without murmurs appreciated. Extremities without clubbing or edema. Respiratory:  Clear to auscultation bilaterally. No wheezes, rales, or rhonchi. No distress.  Gastrointestinal:  +BS, obese but soft, non-tender and non-distended. No HSM noted. No guarding or rebound. No masses appreciated.  Rectal:  Deferred  Musculoskalatal:  Symmetrical without gross deformities. Neurologic:  Alert and oriented x4;  grossly normal neurologically. Psych:  Alert and cooperative. Normal mood and affect. Heme/Lymph/Immune: No excessive bruising noted.    05/18/2017 11:27 AM   Disclaimer: This note was dictated with voice recognition software. Similar  sounding words can inadvertently be transcribed and may not be corrected upon review.

## 2017-05-18 NOTE — Patient Instructions (Signed)
1. Stop taking omeprazole. 2. I sent a prescription to your pharmacy for Protonix 40 mg once a day.  Take this first thing in the morning, on an empty stomach. 3. We will request previous records from [facility]. 4. We will refer you to a local surgical group.  They should call you regarding an appointment. 5. Return for follow-up in 6 months. 6. Call us if you have any questions or concerns.

## 2017-05-18 NOTE — Progress Notes (Signed)
CC'ED TO PCP 

## 2017-06-09 ENCOUNTER — Encounter: Payer: Self-pay | Admitting: General Surgery

## 2017-06-09 ENCOUNTER — Ambulatory Visit (INDEPENDENT_AMBULATORY_CARE_PROVIDER_SITE_OTHER): Payer: Medicare Other | Admitting: General Surgery

## 2017-06-09 VITALS — BP 128/89 | HR 81 | Temp 98.2°F | Ht 70.0 in | Wt 291.0 lb

## 2017-06-09 DIAGNOSIS — K5732 Diverticulitis of large intestine without perforation or abscess without bleeding: Secondary | ICD-10-CM | POA: Diagnosis not present

## 2017-06-09 MED ORDER — METRONIDAZOLE 250 MG PO TABS: 250.0000 mg | ORAL_TABLET | Freq: Three times a day (TID) | ORAL | 0 refills | Status: DC

## 2017-06-09 MED ORDER — NEOMYCIN SULFATE 500 MG PO TABS: 500.0000 mg | ORAL_TABLET | Freq: Three times a day (TID) | ORAL | 0 refills | Status: DC

## 2017-06-09 MED ORDER — PEG 3350-KCL-NABCB-NACL-NASULF 236 G PO SOLR: 4000.0000 mL | Freq: Once | ORAL | 0 refills | Status: AC

## 2017-06-09 NOTE — H&P (Signed)
Mike Davenport; [id number]; [birth year]  HPI  Patient is a 47 year old black male who was referred to my care by Dr. Melina Copa and Walden Field for evaluation and treatment of recurrent episodes of diverticulitis. He has had approximately 4 episodes of sigmoid diverticulitis treated at [facility] over the past year. His last episode was in December 2018. He was treated with in the hospital antibiotics. He currently has no intermittent abdominal pain, 5 out of 10 when he has the episodes. He denies any diarrhea, constipation, fever, or chills.      Past Medical History:  Diagnosis Date  . Carpal tunnel syndrome   . Condylomata acuminata in male   . Coronary artery disease   . Diverticulitis   . GERD (gastroesophageal reflux disease)   . Gout   . Hyperlipidemia   . Hypertension   . Knee pain, bilateral   . Legal blindness   . Lumbar degenerative disc disease   . Obesity   . OSA (obstructive sleep apnea)   . Seizures ([place])   . Tobacco abuse   . Vitamin D deficiency         Past Surgical History:  Procedure Laterality Date  . BIOPSY  09/27/2016   Procedure: BIOPSY; Surgeon: [name], MD; [facility]; Service: Endoscopy;; esophagus and gastric  . COLONOSCOPY N/A 01/24/2017   Procedure: COLONOSCOPY; Surgeon: [name], MD; [facility]; Service: Endoscopy; Laterality: N/A; 1:00pm  . CORONARY ANGIOPLASTY  2016  . ESOPHAGOGASTRODUODENOSCOPY N/A 09/27/2016   Procedure: ESOPHAGOGASTRODUODENOSCOPY (EGD); Surgeon: [name], MD; [facility]; Service: Endoscopy; Laterality: N/A; 300  . 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   Marland Kitchen SAVORY DILATION N/A 09/27/2016   Procedure: SAVORY DILATION; Surgeon: [name], MD; [facility]; Service: Endoscopy; Laterality: N/A;        Family History  Problem Relation Age of Onset  . High blood pressure Mother   .  Cancer Maternal Grandmother   . Colon cancer Neg Hx          Current Outpatient Medications on File Prior to Visit  Medication Sig Dispense Refill  . amLODipine (NORVASC) 10 MG tablet TAKE 1 TABLET (10 MG TOTAL) BY MOUTH DAILY. 30 tablet 0  . aspirin EC 81 MG tablet Take 81 mg by mouth daily.    Marland Kitchen atorvastatin (LIPITOR) 40 MG tablet Take 40 mg by mouth at bedtime.   2  . docusate sodium (COLACE) 100 MG capsule Take 100 mg by mouth 2 (two) times daily as needed for mild constipation.     . hydrochlorothiazide (HYDRODIURIL) 25 MG tablet TAKE 1 TABLET (25 MG TOTAL) BY MOUTH DAILY. (Patient taking differently: Take 25 mg by mouth every Monday, Wednesday, and Friday. ) 30 tablet 0  . isosorbide mononitrate (IMDUR) 60 MG 24 hr tablet Take 1 tablet (60 mg total) by mouth daily. 90 tablet 3  . KLOR-CON M20 20 MEQ tablet Take 20 mEq by mouth See admin instructions. Take 1 tablet twice daily on Mon, Wed, and Fri  11  . metoprolol (LOPRESSOR) 100 MG tablet TAKE 1 TABLET (100 MG TOTAL) BY MOUTH 2 (TWO) TIMES DAILY. 60 tablet 6  . nitroGLYCERIN (NITROSTAT) 0.4 MG SL tablet Place 1 tablet (0.4 mg total) under the tongue every 5 (five) minutes as needed for chest pain. 25 tablet 3  . NON FORMULARY Place 1  each into the nose at bedtime. CPAP    . pantoprazole (PROTONIX) 40 MG tablet Take 1 tablet (40 mg total) by mouth daily. 30 tablet 3  . torsemide (DEMADEX) 20 MG tablet TAKE 1 TABLET (20 MG TOTAL) BY MOUTH DAILY AS NEEDED (SWELLING). 30 tablet 3  . Vitamin D, Ergocalciferol, (DRISDOL) 50000 units CAPS capsule Take 50,000 Units by mouth 2 (two) times a week. Sunday and Wednesday     No current facility-administered medications on file prior to visit.         Allergies  Allergen Reactions  . Other Anaphylaxis    [place] nuts   Social History      Substance and Sexual Activity  Alcohol Use No   Social History       Tobacco Use  Smoking Status Former Smoker  . Packs/day: 0.50  . Types:  Cigarettes  . Last attempt to quit: 04/28/2014  . Years since quitting: 3.1  Smokeless Tobacco Never Used  Tobacco Comment   decreased smoking   Review of Systems  Constitutional: Negative.  HENT: Negative.  Eyes:  Legally blind  Respiratory: Negative.  Cardiovascular: Negative.  Gastrointestinal: Positive for abdominal pain.  Genitourinary: Negative.  Musculoskeletal: Negative.  Skin: Negative.  Neurological: Negative.  Endo/Heme/Allergies: Negative.  Psychiatric/Behavioral: Negative.   Objective     Vitals:   06/09/17 0926  BP: 128/89  Pulse: 81  Temp: 98.2 F (36.8 C)   Physical Exam  Constitutional: He is oriented to person, place, and time and well-developed, well-nourished, and in no distress.  HENT:  Head: Normocephalic and atraumatic.  Cardiovascular: Normal rate, regular rhythm and normal heart sounds. Exam reveals no gallop and no friction rub.  No murmur heard.  Pulmonary/Chest: Effort normal and breath sounds normal. No respiratory distress. He has no wheezes. He has no rales.  Abdominal: Soft. Bowel sounds are normal. He exhibits no distension. There is no tenderness. There is no rebound.  Neurological: He is alert and oriented to person, place, and time.  Skin: Skin is warm and dry.  Vitals reviewed.   [facility] notes reviewed. CT report reviewed.  Assessment  Recurrent sigmoid diverticulitis  Plan  Patient is scheduled for a partial colectomy on 06/27/2017. The risks and benefits of the procedure including bleeding, infection, anastomotic leak, the possibility of a blood transfusion, and the possibility of recurrence of the diverticulitis were fully explained to the patient, who gave informed consent.

## 2017-06-09 NOTE — Patient Instructions (Signed)
Open Colectomy An open colectomy is surgery to remove part or all of the large intestine (colon). This procedure may be used to treat several conditions, including:  Inflammation and infection of the colon (diverticulitis).  Tumors or masses in the colon.  Inflammatory bowel disease, such as Crohn disease or ulcerative colitis.  Bleeding from the colon.  Blockage or obstruction of the colon.  Tell a health care provider about:  Any allergies you have.  All medicines you are taking, including vitamins, herbs, eye drops, creams, and over-the-counter medicines.  Any problems you or family members have had with anesthetic medicines.  Any blood disorders you have.  Any surgeries you have had.  Any medical conditions you have.  Whether you are pregnant or may be pregnant.  Whether you smoke or use tobacco products. These can affect your body's reaction to anesthesia. What are the risks? Generally, this is a safe procedure. However, problems may occur, including:  Infection.  Bleeding.  Allergic reactions to medicines.  Damage to other structures or organs.  Pneumonia.  The incision opening up.  Tissues from inside the abdomen bulging through the incision (hernia).  Reopening of the colon where it was stitched or stapled together.  A blood clot forming in a vein and traveling to the lungs.  Future blockage of the small intestine from scar tissue.  What happens before the procedure? Staying hydrated Follow instructions from your health care provider about hydration, which may include:  Up to 2 hours before the procedure - you may continue to drink clear liquids, such as water, clear fruit juice, black coffee, and plain tea.  Eating and drinking restrictions Follow instructions from your health care provider about eating and drinking, which may include:  8 hours before the procedure - stop eating heavy meals or foods such as meat, fried foods, or fatty foods.  6  hours before the procedure - stop eating light meals or foods, such as toast or cereal.  6 hours before the procedure - stop drinking milk or drinks that contain milk.  2 hours before the procedure - stop drinking clear liquids.  Bowel prep In some cases, you may be prescribed an oral bowel prep to clean out your colon. If so:  Take it as told by your health care provider. Starting the day before your procedure, you may need to drink a large amount of medicated liquid. The liquid will cause you to have multiple loose stools until your stool is almost clear or light green.  Follow instructions from your health care provider about eating and drinking restrictions during bowel prep.  Medicines  Ask your health care provider about: ? Changing or stopping your regular medicines or vitamins. This is especially important if you are taking diabetes medicines, blood thinners, or vitamin E. ? Taking medicines such as aspirin and ibuprofen. These medicines can thin your blood. Do not take these medicines before your procedure if your health care provider instructs you not to.  If you were prescribed an antibiotic medicine, take it as told by your health care provider. General instructions  Bring loose-fitting, comfortable clothing and slip-on shoes that you can put on without bending over.  Make sure to see your health care provider for any tests that you need before the procedure, such as: ? Blood tests. ? A test to check the heart's rhythm (electrocardiogram, ECG). ? A CT scan of your abdomen. ? Urine tests. ? Colonoscopy.  Plan to have someone take you home from the   hospital or clinic.  Arrange for someone to help you with your activities during your recovery. What happens during the procedure?  To reduce your risk of infection: ? Your health care team will wash or sanitize their hands. ? Your skin will be washed with soap. ? Hair may be removed from the surgical area.  An IV tube  will be inserted into one of your veins. The tube will be used to give you medicines and fluids.  You will be given a medicine to make you fall asleep (general anesthetic). You may also be given a medicine to help you relax (sedative).  Small monitors will be connected to your body. They will be used to check your heart, blood pressure, and oxygen level.  A breathing tube may be placed into your lungs during the procedure.  A thin, flexible tube (catheter) will be placed into your bladder to drain urine.  A tube may be inserted through your nose and into your stomach (nasogastric tube, or NG tube). The tube is used to remove stomach fluids after surgery until the intestines start working again.  An incision will be made in your abdomen.  Clamps or staples will be put on your colon.  The part of the colon between the clamps or staples will be removed.  The ends of the colon that remain will be stitched or stapled together.  The incision in your abdomen will be closed with stitches (sutures) or staples.  The incision will be covered with a bandage (dressing).  A small opening (stoma) may be created in your lower abdomen. A removable, external pouch (ostomy pouch) will be attached to the stoma. This pouch will collect stool outside of your body. Stool passes through the stoma and into the pouch instead of through your anus. The procedure may vary among health care providers and hospitals. What happens after the procedure?  Your blood pressure, heart rate, breathing rate, and blood oxygen level will be monitored until the medicines you were given have worn off.  You may continue to receive fluids and medicines through an IV tube.  You will start on a clear liquid diet and gradually go back to a normal diet.  Do not drive until your health care provider approves.  You may have some pain in your abdomen. You will be given pain medicine to control the pain.  You will be encouraged to  do the following: ? Do breathing exercises to prevent pneumonia. ? Get up and start walking within a day after surgery. You should try to get up 5-6 times a day. This information is not intended to replace advice given to you by your health care provider. Make sure you discuss any questions you have with your health care provider. Document Released: 02/07/2009 Document Revised: 01/12/2016 Document Reviewed: 01/12/2016 Elsevier Interactive Patient Education  2018 [employer]  

## 2017-06-09 NOTE — Progress Notes (Signed)
Mike Davenport; [id number]; 1970-12-10   HPI Patient is a 47 year old black male who was referred to my care by Dr. Melina Copa and Walden Field for evaluation and treatment of recurrent episodes of diverticulitis.  He has had approximately 4 episodes of sigmoid diverticulitis treated at [facility] over the past year.  His last episode was in December 2018.  He was treated with in the hospital antibiotics.  He currently has no intermittent abdominal pain, 5 out of 10 when he has the episodes.  He denies any diarrhea, constipation, fever, or chills. Past Medical History:  Diagnosis Date  . Carpal tunnel syndrome   . Condylomata acuminata in male   . Coronary artery disease   . Diverticulitis   . GERD (gastroesophageal reflux disease)   . Gout   . Hyperlipidemia   . Hypertension   . Knee pain, bilateral   . Legal blindness   . Lumbar degenerative disc disease   . Obesity   . OSA (obstructive sleep apnea)   . Seizures ([place])   . Tobacco abuse   . Vitamin D deficiency     Past Surgical History:  Procedure Laterality Date  . BIOPSY  09/27/2016   Procedure: BIOPSY;  Surgeon: [name], MD;  Location: [facility];  Service: Endoscopy;;  esophagus and gastric  . COLONOSCOPY N/A 01/24/2017   Procedure: COLONOSCOPY;  Surgeon: [name], MD;  Location: [facility];  Service: Endoscopy;  Laterality: N/A;  1:00pm  . CORONARY ANGIOPLASTY  2016  . ESOPHAGOGASTRODUODENOSCOPY N/A 09/27/2016   Procedure: ESOPHAGOGASTRODUODENOSCOPY (EGD);  Surgeon: [name], MD;  Location: [facility];  Service: Endoscopy;  Laterality: N/A;  300  . 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   Marland Kitchen SAVORY DILATION N/A 09/27/2016   Procedure: SAVORY DILATION;  Surgeon: [name], MD;  Location: [facility];  Service: Endoscopy;  Laterality: N/A;    Family History  Problem Relation Age of Onset  . High blood pressure  Mother   . Cancer Maternal Grandmother   . Colon cancer Neg Hx     Current Outpatient Medications on File Prior to Visit  Medication Sig Dispense Refill  . amLODipine (NORVASC) 10 MG tablet TAKE 1 TABLET (10 MG TOTAL) BY MOUTH DAILY. 30 tablet 0  . aspirin EC 81 MG tablet Take 81 mg by mouth daily.    Marland Kitchen atorvastatin (LIPITOR) 40 MG tablet Take 40 mg by mouth at bedtime.   2  . docusate sodium (COLACE) 100 MG capsule Take 100 mg by mouth 2 (two) times daily as needed for mild constipation.     . hydrochlorothiazide (HYDRODIURIL) 25 MG tablet TAKE 1 TABLET (25 MG TOTAL) BY MOUTH DAILY. (Patient taking differently: Take 25 mg by mouth every Monday, Wednesday, and Friday. ) 30 tablet 0  . isosorbide mononitrate (IMDUR) 60 MG 24 hr tablet Take 1 tablet (60 mg total) by mouth daily. 90 tablet 3  . KLOR-CON M20 20 MEQ tablet Take 20 mEq by mouth See admin instructions. Take 1 tablet twice daily on Mon, Wed, and Fri  11  . metoprolol (LOPRESSOR) 100 MG tablet TAKE 1 TABLET (100 MG TOTAL) BY MOUTH 2 (TWO) TIMES DAILY. 60 tablet 6  . nitroGLYCERIN (NITROSTAT) 0.4 MG SL tablet Place 1 tablet (0.4 mg total) under the tongue every 5 (five) minutes as needed for chest pain. 25 tablet 3  .  NON FORMULARY Place 1 each into the nose at bedtime. CPAP    . pantoprazole (PROTONIX) 40 MG tablet Take 1 tablet (40 mg total) by mouth daily. 30 tablet 3  . torsemide (DEMADEX) 20 MG tablet TAKE 1 TABLET (20 MG TOTAL) BY MOUTH DAILY AS NEEDED (SWELLING). 30 tablet 3  . Vitamin D, Ergocalciferol, (DRISDOL) 50000 units CAPS capsule Take 50,000 Units by mouth 2 (two) times a week. Sunday and Wednesday     No current facility-administered medications on file prior to visit.     Allergies  Allergen Reactions  . Other Anaphylaxis    [place] nuts    Social History   Substance and Sexual Activity  Alcohol Use No    Social History   Tobacco Use  Smoking Status Former Smoker  . Packs/day: 0.50  . Types: Cigarettes   . Last attempt to quit: 04/28/2014  . Years since quitting: 3.1  Smokeless Tobacco Never Used  Tobacco Comment   decreased smoking    Review of Systems  Constitutional: Negative.   HENT: Negative.   Eyes:       Legally blind  Respiratory: Negative.   Cardiovascular: Negative.   Gastrointestinal: Positive for abdominal pain.  Genitourinary: Negative.   Musculoskeletal: Negative.   Skin: Negative.   Neurological: Negative.   Endo/Heme/Allergies: Negative.   Psychiatric/Behavioral: Negative.     Objective   Vitals:   06/09/17 0926  BP: 128/89  Pulse: 81  Temp: 98.2 F (36.8 C)    Physical Exam  Constitutional: He is oriented to person, place, and time and well-developed, well-nourished, and in no distress.  HENT:  Head: Normocephalic and atraumatic.  Cardiovascular: Normal rate, regular rhythm and normal heart sounds. Exam reveals no gallop and no friction rub.  No murmur heard. Pulmonary/Chest: Effort normal and breath sounds normal. No respiratory distress. He has no wheezes. He has no rales.  Abdominal: Soft. Bowel sounds are normal. He exhibits no distension. There is no tenderness. There is no rebound.  Neurological: He is alert and oriented to person, place, and time.  Skin: Skin is warm and dry.  Vitals reviewed.  [facility] notes reviewed.  CT report reviewed. Assessment  Recurrent sigmoid diverticulitis Plan   Patient is scheduled for a partial colectomy on 06/27/2017.  The risks and benefits of the procedure including bleeding, infection, anastomotic leak, the possibility of a blood transfusion, and the possibility of recurrence of the diverticulitis were fully explained to the patient, who gave informed consent.

## 2017-06-20 NOTE — Patient Instructions (Signed)
Mike Davenport  06/20/2017     @PREFPERIOPPHARMACY @   Your procedure is scheduled on  06/27/2017   Report to [facility] at  29   A.M.  Call this number if you have problems the morning of surgery:  [phone]   Remember:  Do not eat food or drink liquids after midnight.  Take these medicines the morning of surgery with A SIP OF WATER  Amlodipine, imdur, metoprolol, protonix.   Do not wear jewelry, make-up or nail polish.  Do not wear lotions, powders, or perfumes, or deodorant.  Do not shave 48 hours prior to surgery.  Men may shave face and neck.  Do not bring valuables to the hospital.  [facility] is not responsible for any belongings or valuables.  Contacts, dentures or bridgework may not be worn into surgery.  Leave your suitcase in the car.  After surgery it may be brought to your room.  For patients admitted to the hospital, discharge time will be determined by your treatment team.  Patients discharged the day of surgery will not be allowed to drive home.   Name and phone number of your driver:   family Special instructions:  Follow any prep and diet instructions given to you by Dr Arnoldo Morale.  Please read over the following fact sheets that you were given. Anesthesia Post-op Instructions and Care and Recovery After Surgery       Open Colectomy An open colectomy is surgery to remove part or all of the large intestine (colon). This procedure may be used to treat several conditions, including:  Inflammation and infection of the colon (diverticulitis).  Tumors or masses in the colon.  Inflammatory bowel disease, such as Crohn disease or ulcerative colitis.  Bleeding from the colon.  Blockage or obstruction of the colon.  Tell a health care provider about:  Any allergies you have.  All medicines you are taking, including vitamins, herbs, eye drops, creams, and over-the-counter medicines.  Any problems you or family members have had with  anesthetic medicines.  Any blood disorders you have.  Any surgeries you have had.  Any medical conditions you have.  Whether you are pregnant or may be pregnant.  Whether you smoke or use tobacco products. These can affect your body's reaction to anesthesia. What are the risks? Generally, this is a safe procedure. However, problems may occur, including:  Infection.  Bleeding.  Allergic reactions to medicines.  Damage to other structures or organs.  Pneumonia.  The incision opening up.  Tissues from inside the abdomen bulging through the incision (hernia).  Reopening of the colon where it was stitched or stapled together.  A blood clot forming in a vein and traveling to the lungs.  Future blockage of the small intestine from scar tissue.  What happens before the procedure? Staying hydrated Follow instructions from your health care provider about hydration, which may include:  Up to 2 hours before the procedure - you may continue to drink clear liquids, such as water, clear fruit juice, black coffee, and plain tea.  Eating and drinking restrictions Follow instructions from your health care provider about eating and drinking, which may include:  8 hours before the procedure - stop eating heavy meals or foods such as meat, fried foods, or fatty foods.  6 hours before the procedure - stop eating light meals or foods, such as toast or cereal.  6 hours before the procedure - stop drinking milk  or drinks that contain milk.  2 hours before the procedure - stop drinking clear liquids.  Bowel prep In some cases, you may be prescribed an oral bowel prep to clean out your colon. If so:  Take it as told by your health care provider. Starting the day before your procedure, you may need to drink a large amount of medicated liquid. The liquid will cause you to have multiple loose stools until your stool is almost clear or light green.  Follow instructions from your health care  provider about eating and drinking restrictions during bowel prep.  Medicines  Ask your health care provider about: ? Changing or stopping your regular medicines or vitamins. This is especially important if you are taking diabetes medicines, blood thinners, or vitamin E. ? Taking medicines such as aspirin and ibuprofen. These medicines can thin your blood. Do not take these medicines before your procedure if your health care provider instructs you not to.  If you were prescribed an antibiotic medicine, take it as told by your health care provider. General instructions  Bring loose-fitting, comfortable clothing and slip-on shoes that you can put on without bending over.  Make sure to see your health care provider for any tests that you need before the procedure, such as: ? Blood tests. ? A test to check the heart's rhythm (electrocardiogram, ECG). ? A CT scan of your abdomen. ? Urine tests. ? Colonoscopy.  Plan to have someone take you home from the hospital or clinic.  Arrange for someone to help you with your activities during your recovery. What happens during the procedure?  To reduce your risk of infection: ? Your health care team will wash or sanitize their hands. ? Your skin will be washed with soap. ? Hair may be removed from the surgical area.  An IV tube will be inserted into one of your veins. The tube will be used to give you medicines and fluids.  You will be given a medicine to make you fall asleep (general anesthetic). You may also be given a medicine to help you relax (sedative).  Small monitors will be connected to your body. They will be used to check your heart, blood pressure, and oxygen level.  A breathing tube may be placed into your lungs during the procedure.  A thin, flexible tube (catheter) will be placed into your bladder to drain urine.  A tube may be inserted through your nose and into your stomach (nasogastric tube, or NG tube). The tube is used to  remove stomach fluids after surgery until the intestines start working again.  An incision will be made in your abdomen.  Clamps or staples will be put on your colon.  The part of the colon between the clamps or staples will be removed.  The ends of the colon that remain will be stitched or stapled together.  The incision in your abdomen will be closed with stitches (sutures) or staples.  The incision will be covered with a bandage (dressing).  A small opening (stoma) may be created in your lower abdomen. A removable, external pouch (ostomy pouch) will be attached to the stoma. This pouch will collect stool outside of your body. Stool passes through the stoma and into the pouch instead of through your anus. The procedure may vary among health care providers and hospitals. What happens after the procedure?  Your blood pressure, heart rate, breathing rate, and blood oxygen level will be monitored until the medicines you were given have worn  off.  You may continue to receive fluids and medicines through an IV tube.  You will start on a clear liquid diet and gradually go back to a normal diet.  Do not drive until your health care provider approves.  You may have some pain in your abdomen. You will be given pain medicine to control the pain.  You will be encouraged to do the following: ? Do breathing exercises to prevent pneumonia. ? Get up and start walking within a day after surgery. You should try to get up 5-6 times a day. This information is not intended to replace advice given to you by your health care provider. Make sure you discuss any questions you have with your health care provider. Document Released: 02/07/2009 Document Revised: 01/12/2016 Document Reviewed: 01/12/2016 Elsevier Interactive Patient Education  2018 [employer].  Open Colectomy, Care After This sheet gives you information about how to care for yourself after your procedure. Your health care provider may  also give you more specific instructions. If you have problems or questions, contact your health care provider. What can I expect after the procedure? After the procedure, it is common to have:  Pain in your abdomen, especially along your incision.  Tiredness. Your energy level will return to normal over the next several weeks.  Constipation.  Nausea.  Difficulty urinating.  Follow these instructions at home: Activity  You may be able to return to most of your normal activities within 1-2 weeks, such as working, walking up stairs, and sexual activity.  Avoid activities that require a lot of energy for 4-6 weeks after surgery, such as running, climbing, and lifting heavy objects. Ask your health care provider what activities are safe for you.  Take rest breaks during the day as needed.  Do not drive for 1-2 weeks or until your health care provider says that it is safe.  Do not drive or use heavy machinery while taking prescription pain medicines.  Do not lift anything that is heavier than 10 lb (4.3 kg) until your health care provider says that it is safe. Incision care  Follow instructions from your health care provider about how to take care of your incision. Make sure you: ? Wash your hands with soap and water before you change your bandage (dressing). If soap and water are not available, use hand sanitizer. ? Change your dressing as told by your health care provider. ? Leave stitches (sutures) or staples in place. These skin closures may need to stay in place for 2 weeks or longer.  Avoid wearing tight clothing around your incision.  Protect your incision area from the sun.  Check your incision area every day for signs of infection. Check for: ? More redness, swelling, or pain. ? More fluid or blood. ? Warmth. ? Pus or a bad smell. General instructions  Do not take baths, swim, or use a hot tub until your health care provider approves. Ask your health care provider  when you may shower.  Take over-the-counter and prescription medicines, including stool softeners, only as told by your health care provider.  Eat a low-fat and low-fiber diet for the first 4 weeks after surgery.  Keep all follow-up visits as told by your health care provider. This is important. Contact a health care provider if:  You have more redness, swelling, or pain around your incision.  You have more fluid or blood coming from your incision.  Your incision feels warm to the touch.  You have pus or  a bad smell coming from your incision.  You have a fever or chills.  You do not have a bowel movement 2-3 days after surgery.  You cannot eat or drink for 24 hours or more.  You have persistent nausea and vomiting.  You have abdominal pain that gets worse and does not get better with medicine. Get help right away if:  You have chest pain.  You have shortness of breath.  You have pain or swelling in your legs.  Your incision breaks open after your sutures or staples have been removed.  You have bleeding from the rectum. This information is not intended to replace advice given to you by your health care provider. Make sure you discuss any questions you have with your health care provider. Document Released: 11/03/2010 Document Revised: 01/12/2016 Document Reviewed: 01/12/2016 Elsevier Interactive Patient Education  2018 [place] Anesthesia, Adult General anesthesia is the use of medicines to make a person "go to sleep" (be unconscious) for a medical procedure. General anesthesia is often recommended when a procedure:  Is long.  Requires you to be still or in an unusual position.  Is major and can cause you to lose blood.  Is impossible to do without general anesthesia.  The medicines used for general anesthesia are called general anesthetics. In addition to making you sleep, the medicines:  Prevent pain.  Control your blood pressure.  Relax your  muscles.  Tell a health care provider about:  Any allergies you have.  All medicines you are taking, including vitamins, herbs, eye drops, creams, and over-the-counter medicines.  Any problems you or family members have had with anesthetic medicines.  Types of anesthetics you have had in the past.  Any bleeding disorders you have.  Any surgeries you have had.  Any medical conditions you have.  Any history of heart or lung conditions, such as heart failure, sleep apnea, or chronic obstructive pulmonary disease (COPD).  Whether you are pregnant or may be pregnant.  Whether you use tobacco, alcohol, marijuana, or street drugs.  Any history of Armed forces logistics/support/administrative officer.  Any history of depression or anxiety. What are the risks? Generally, this is a safe procedure. However, problems may occur, including:  Allergic reaction to anesthetics.  Lung and heart problems.  Inhaling food or liquids from your stomach into your lungs (aspiration).  Injury to nerves.  Waking up during your procedure and being unable to move (rare).  Extreme agitation or a state of mental confusion (delirium) when you wake up from the anesthetic.  Air in the bloodstream, which can lead to stroke.  These problems are more likely to develop if you are having a major surgery or if you have an advanced medical condition. You can prevent some of these complications by answering all of your health care provider's questions thoroughly and by following all pre-procedure instructions. General anesthesia can cause side effects, including:  Nausea or vomiting  A sore throat from the breathing tube.  Feeling cold or shivery.  Feeling tired, washed out, or achy.  Sleepiness or drowsiness.  Confusion or agitation.  What happens before the procedure? Staying hydrated Follow instructions from your health care provider about hydration, which may include:  Up to 2 hours before the procedure - you may continue to  drink clear liquids, such as water, clear fruit juice, black coffee, and plain tea.  Eating and drinking restrictions Follow instructions from your health care provider about eating and drinking, which may include:  8 hours  before the procedure - stop eating heavy meals or foods such as meat, fried foods, or fatty foods.  6 hours before the procedure - stop eating light meals or foods, such as toast or cereal.  6 hours before the procedure - stop drinking milk or drinks that contain milk.  2 hours before the procedure - stop drinking clear liquids.  Medicines  Ask your health care provider about: ? Changing or stopping your regular medicines. This is especially important if you are taking diabetes medicines or blood thinners. ? Taking medicines such as aspirin and ibuprofen. These medicines can thin your blood. Do not take these medicines before your procedure if your health care provider instructs you not to. ? Taking new dietary supplements or medicines. Do not take these during the week before your procedure unless your health care provider approves them.  If you are told to take a medicine or to continue taking a medicine on the day of the procedure, take the medicine with sips of water. General instructions   Ask if you will be going home the same day, the following day, or after a longer hospital stay. ? Plan to have someone take you home. ? Plan to have someone stay with you for the first 24 hours after you leave the hospital or clinic.  For 3-6 weeks before the procedure, try not to use any tobacco products, such as cigarettes, chewing tobacco, and e-cigarettes.  You may brush your teeth on the morning of the procedure, but make sure to spit out the toothpaste. What happens during the procedure?  You will be given anesthetics through a mask and through an IV tube in one of your veins.  You may receive medicine to help you relax (sedative).  As soon as you are asleep, a  breathing tube may be used to help you breathe.  An anesthesia specialist will stay with you throughout the procedure. He or she will help keep you comfortable and safe by continuing to give you medicines and adjusting the amount of medicine that you get. He or she will also watch your blood pressure, pulse, and oxygen levels to make sure that the anesthetics do not cause any problems.  If a breathing tube was used to help you breathe, it will be removed before you wake up. The procedure may vary among health care providers and hospitals. What happens after the procedure?  You will wake up, often slowly, after the procedure is complete, usually in a recovery area.  Your blood pressure, heart rate, breathing rate, and blood oxygen level will be monitored until the medicines you were given have worn off.  You may be given medicine to help you calm down if you feel anxious or agitated.  If you will be going home the same day, your health care provider may check to make sure you can stand, drink, and urinate.  Your health care providers will treat your pain and side effects before you go home.  Do not drive for 24 hours if you received a sedative.  You may: ? Feel nauseous and vomit. ? Have a sore throat. ? Have mental slowness. ? Feel cold or shivery. ? Feel sleepy. ? Feel tired. ? Feel sore or achy, even in parts of your body where you did not have surgery. This information is not intended to replace advice given to you by your health care provider. Make sure you discuss any questions you have with your health care provider. Document Released: 07/20/2007  Document Revised: 09/23/2015 Document Reviewed: 03/27/2015 Elsevier Interactive Patient Education  2018 [place] Anesthesia, Adult, Care After These instructions provide you with information about caring for yourself after your procedure. Your health care provider may also give you more specific instructions. Your  treatment has been planned according to current medical practices, but problems sometimes occur. Call your health care provider if you have any problems or questions after your procedure. What can I expect after the procedure? After the procedure, it is common to have:  Vomiting.  A sore throat.  Mental slowness.  It is common to feel:  Nauseous.  Cold or shivery.  Sleepy.  Tired.  Sore or achy, even in parts of your body where you did not have surgery.  Follow these instructions at home: For at least 24 hours after the procedure:  Do not: ? Participate in activities where you could fall or become injured. ? Drive. ? Use heavy machinery. ? Drink alcohol. ? Take sleeping pills or medicines that cause drowsiness. ? Make important decisions or sign legal documents. ? Take care of children on your own.  Rest. Eating and drinking  If you vomit, drink water, juice, or soup when you can drink without vomiting.  Drink enough fluid to keep your urine clear or pale yellow.  Make sure you have little or no nausea before eating solid foods.  Follow the diet recommended by your health care provider. General instructions  Have a responsible adult stay with you until you are awake and alert.  Return to your normal activities as told by your health care provider. Ask your health care provider what activities are safe for you.  Take over-the-counter and prescription medicines only as told by your health care provider.  If you smoke, do not smoke without supervision.  Keep all follow-up visits as told by your health care provider. This is important. Contact a health care provider if:  You continue to have nausea or vomiting at home, and medicines are not helpful.  You cannot drink fluids or start eating again.  You cannot urinate after 8-12 hours.  You develop a skin rash.  You have fever.  You have increasing redness at the site of your procedure. Get help right  away if:  You have difficulty breathing.  You have chest pain.  You have unexpected bleeding.  You feel that you are having a life-threatening or urgent problem. This information is not intended to replace advice given to you by your health care provider. Make sure you discuss any questions you have with your health care provider. Document Released: 07/19/2000 Document Revised: 09/15/2015 Document Reviewed: 03/27/2015 Elsevier Interactive Patient Education  [employer].

## 2017-06-22 ENCOUNTER — Encounter (HOSPITAL_COMMUNITY)
Admission: RE | Admit: 2017-06-22 | Discharge: 2017-06-22 | Disposition: A | Discharge status: Home / Self Care | Payer: Medicare Other | Source: Ambulatory Visit | Attending: General Surgery | Admitting: General Surgery

## 2017-06-22 ENCOUNTER — Other Ambulatory Visit: Payer: Self-pay

## 2017-06-22 ENCOUNTER — Encounter (HOSPITAL_COMMUNITY): Payer: Self-pay

## 2017-06-22 DIAGNOSIS — Z01810 Encounter for preprocedural cardiovascular examination: Secondary | ICD-10-CM | POA: Insufficient documentation

## 2017-06-22 DIAGNOSIS — Z01812 Encounter for preprocedural laboratory examination: Secondary | ICD-10-CM | POA: Insufficient documentation

## 2017-06-22 HISTORY — DX: Acute myocardial infarction, unspecified: I21.9

## 2017-06-22 LAB — CBC WITH DIFFERENTIAL/PLATELET
BASOS PCT: 0 %
Basophils Absolute: 0 10*3/uL (ref 0.0–0.1)
EOS ABS: 0.1 10*3/uL (ref 0.0–0.7)
Eosinophils Relative: 1 %
HCT: 40.5 % (ref 39.0–52.0)
HEMOGLOBIN: 13.1 g/dL (ref 13.0–17.0)
Lymphocytes Relative: 48 %
Lymphs Abs: 3 10*3/uL (ref 0.7–4.0)
MCH: 24.7 pg — ABNORMAL LOW (ref 26.0–34.0)
MCHC: 32.3 g/dL (ref 30.0–36.0)
MCV: 76.4 fL — ABNORMAL LOW (ref 78.0–100.0)
MONOS PCT: 7 %
Monocytes Absolute: 0.5 10*3/uL (ref 0.1–1.0)
NEUTROS PCT: 44 %
Neutro Abs: 2.8 10*3/uL (ref 1.7–7.7)
Platelets: 209 10*3/uL (ref 150–400)
RBC: 5.3 MIL/uL (ref 4.22–5.81)
RDW: 15.2 % (ref 11.5–15.5)
WBC: 6.3 10*3/uL (ref 4.0–10.5)

## 2017-06-22 LAB — BASIC METABOLIC PANEL
Anion gap: 8 (ref 5–15)
BUN: 8 mg/dL (ref 6–20)
CALCIUM: 9.4 mg/dL (ref 8.9–10.3)
CO2: 25 mmol/L (ref 22–32)
CREATININE: 0.84 mg/dL (ref 0.61–1.24)
Chloride: 105 mmol/L (ref 101–111)
Glucose, Bld: 95 mg/dL (ref 65–99)
Potassium: 3.9 mmol/L (ref 3.5–5.1)
SODIUM: 138 mmol/L (ref 135–145)

## 2017-06-22 LAB — PREPARE RBC (CROSSMATCH)

## 2017-06-22 LAB — ABO/RH: ABO/RH(D): B POS

## 2017-06-27 ENCOUNTER — Encounter (HOSPITAL_COMMUNITY)
Admission: RE | Disposition: A | Discharge status: Home / Self Care | Payer: Self-pay | Source: Ambulatory Visit | Attending: General Surgery

## 2017-06-27 ENCOUNTER — Inpatient Hospital Stay (HOSPITAL_COMMUNITY)
Admission: RE | Admit: 2017-06-27 | Discharge: 2017-06-29 | DRG: 330 | Disposition: A | Discharge status: Home / Self Care | Payer: Medicare Other | Source: Ambulatory Visit | Attending: General Surgery | Admitting: General Surgery

## 2017-06-27 ENCOUNTER — Inpatient Hospital Stay (HOSPITAL_COMMUNITY): Payer: Medicare Other | Admitting: Anesthesiology

## 2017-06-27 ENCOUNTER — Encounter (HOSPITAL_COMMUNITY): Payer: Self-pay | Admitting: Anesthesiology

## 2017-06-27 ENCOUNTER — Other Ambulatory Visit: Payer: Self-pay

## 2017-06-27 DIAGNOSIS — I2510 Atherosclerotic heart disease of native coronary artery without angina pectoris: Secondary | ICD-10-CM | POA: Diagnosis present

## 2017-06-27 DIAGNOSIS — G4733 Obstructive sleep apnea (adult) (pediatric): Secondary | ICD-10-CM | POA: Diagnosis present

## 2017-06-27 DIAGNOSIS — Z91018 Allergy to other foods: Secondary | ICD-10-CM | POA: Diagnosis not present

## 2017-06-27 DIAGNOSIS — I10 Essential (primary) hypertension: Secondary | ICD-10-CM | POA: Diagnosis present

## 2017-06-27 DIAGNOSIS — E559 Vitamin D deficiency, unspecified: Secondary | ICD-10-CM | POA: Diagnosis present

## 2017-06-27 DIAGNOSIS — E785 Hyperlipidemia, unspecified: Secondary | ICD-10-CM | POA: Diagnosis present

## 2017-06-27 DIAGNOSIS — H548 Legal blindness, as defined in USA: Secondary | ICD-10-CM | POA: Diagnosis present

## 2017-06-27 DIAGNOSIS — Z6841 Body Mass Index (BMI) 40.0 and over, adult: Secondary | ICD-10-CM

## 2017-06-27 DIAGNOSIS — I252 Old myocardial infarction: Secondary | ICD-10-CM | POA: Diagnosis not present

## 2017-06-27 DIAGNOSIS — Z79899 Other long term (current) drug therapy: Secondary | ICD-10-CM

## 2017-06-27 DIAGNOSIS — Z7982 Long term (current) use of aspirin: Secondary | ICD-10-CM | POA: Diagnosis not present

## 2017-06-27 DIAGNOSIS — Z87891 Personal history of nicotine dependence: Secondary | ICD-10-CM | POA: Diagnosis not present

## 2017-06-27 DIAGNOSIS — R569 Unspecified convulsions: Secondary | ICD-10-CM | POA: Diagnosis present

## 2017-06-27 DIAGNOSIS — Z9049 Acquired absence of other specified parts of digestive tract: Secondary | ICD-10-CM

## 2017-06-27 DIAGNOSIS — K219 Gastro-esophageal reflux disease without esophagitis: Secondary | ICD-10-CM | POA: Diagnosis present

## 2017-06-27 DIAGNOSIS — K5732 Diverticulitis of large intestine without perforation or abscess without bleeding: Secondary | ICD-10-CM | POA: Diagnosis present

## 2017-06-27 DIAGNOSIS — K5730 Diverticulosis of large intestine without perforation or abscess without bleeding: Secondary | ICD-10-CM

## 2017-06-27 HISTORY — PX: PARTIAL COLECTOMY: SHX5273

## 2017-06-27 SURGERY — COLECTOMY, PARTIAL
Anesthesia: General | Site: Abdomen

## 2017-06-27 MED ORDER — ROCURONIUM BROMIDE 50 MG/5ML IV SOLN
INTRAVENOUS | Status: AC
  Filled 2017-06-27: qty 1

## 2017-06-27 MED ORDER — CHLORHEXIDINE GLUCONATE CLOTH 2 % EX PADS: 6.0000 | MEDICATED_PAD | Freq: Once | CUTANEOUS | Status: DC

## 2017-06-27 MED ORDER — ONDANSETRON 4 MG PO TBDP: 4.0000 mg | ORAL_TABLET | Freq: Four times a day (QID) | ORAL | Status: DC | PRN

## 2017-06-27 MED ORDER — SODIUM CHLORIDE 0.9 % IV SOLN
INTRAVENOUS | Status: AC
  Filled 2017-06-27: qty 1

## 2017-06-27 MED ORDER — DIPHENHYDRAMINE HCL 25 MG PO CAPS
25.0000 mg | ORAL_CAPSULE | Freq: Four times a day (QID) | ORAL | Status: DC | PRN
  Administered 2017-06-27 – 2017-06-28 (×3): 25 mg via ORAL
  Filled 2017-06-27 (×4): qty 1

## 2017-06-27 MED ORDER — SODIUM CHLORIDE 0.9 % IV SOLN
1.0000 g | INTRAVENOUS | Status: AC
  Administered 2017-06-27: 1 g via INTRAVENOUS

## 2017-06-27 MED ORDER — PANTOPRAZOLE SODIUM 40 MG PO TBEC
40.0000 mg | DELAYED_RELEASE_TABLET | Freq: Every day | ORAL | Status: DC
  Administered 2017-06-28 – 2017-06-29 (×2): 40 mg via ORAL
  Filled 2017-06-27 (×2): qty 1

## 2017-06-27 MED ORDER — 0.9 % SODIUM CHLORIDE (POUR BTL) OPTIME
TOPICAL | Status: DC | PRN
Start: 2017-06-27 — End: 2017-06-27
  Administered 2017-06-27: 1000 mL

## 2017-06-27 MED ORDER — ALVIMOPAN 12 MG PO CAPS
12.0000 mg | ORAL_CAPSULE | ORAL | Status: AC
  Administered 2017-06-27: 12 mg via ORAL

## 2017-06-27 MED ORDER — ALVIMOPAN 12 MG PO CAPS
ORAL_CAPSULE | ORAL | Status: AC
  Filled 2017-06-27: qty 1

## 2017-06-27 MED ORDER — PROPOFOL 10 MG/ML IV BOLUS
INTRAVENOUS | Status: AC
  Filled 2017-06-27: qty 40

## 2017-06-27 MED ORDER — GLYCOPYRROLATE 0.2 MG/ML IJ SOLN
INTRAMUSCULAR | Status: DC | PRN
  Administered 2017-06-27: .6 mg via INTRAVENOUS

## 2017-06-27 MED ORDER — MIDAZOLAM HCL 2 MG/2ML IJ SOLN
INTRAMUSCULAR | Status: AC
  Filled 2017-06-27: qty 2

## 2017-06-27 MED ORDER — KETOROLAC TROMETHAMINE 30 MG/ML IJ SOLN: 30.0000 mg | Freq: Four times a day (QID) | INTRAMUSCULAR | Status: DC | PRN

## 2017-06-27 MED ORDER — DEXAMETHASONE SODIUM PHOSPHATE 4 MG/ML IJ SOLN
4.0000 mg | Freq: Once | INTRAMUSCULAR | Status: AC
  Administered 2017-06-27: 4 mg via INTRAVENOUS
  Filled 2017-06-27: qty 1

## 2017-06-27 MED ORDER — LORAZEPAM 2 MG/ML IJ SOLN: 1.0000 mg | INTRAMUSCULAR | Status: DC | PRN

## 2017-06-27 MED ORDER — MIDAZOLAM HCL 2 MG/2ML IJ SOLN
1.0000 mg | INTRAMUSCULAR | Status: DC
  Administered 2017-06-27: 2 mg via INTRAVENOUS

## 2017-06-27 MED ORDER — SIMETHICONE 80 MG PO CHEW: 40.0000 mg | CHEWABLE_TABLET | Freq: Four times a day (QID) | ORAL | Status: DC | PRN

## 2017-06-27 MED ORDER — NEOSTIGMINE METHYLSULFATE 10 MG/10ML IV SOLN
INTRAVENOUS | Status: DC | PRN
  Administered 2017-06-27: 3.5 mg via INTRAVENOUS

## 2017-06-27 MED ORDER — KETOROLAC TROMETHAMINE 30 MG/ML IJ SOLN
30.0000 mg | Freq: Four times a day (QID) | INTRAMUSCULAR | Status: DC
  Administered 2017-06-27 – 2017-06-29 (×8): 30 mg via INTRAVENOUS
  Filled 2017-06-27 (×8): qty 1

## 2017-06-27 MED ORDER — ALVIMOPAN 12 MG PO CAPS
12.0000 mg | ORAL_CAPSULE | Freq: Two times a day (BID) | ORAL | Status: DC
  Administered 2017-06-28 – 2017-06-29 (×3): 12 mg via ORAL
  Filled 2017-06-27 (×3): qty 1

## 2017-06-27 MED ORDER — NITROGLYCERIN 0.4 MG SL SUBL: 0.4000 mg | SUBLINGUAL_TABLET | SUBLINGUAL | Status: DC | PRN

## 2017-06-27 MED ORDER — ATORVASTATIN CALCIUM 40 MG PO TABS
40.0000 mg | ORAL_TABLET | Freq: Every day | ORAL | Status: DC
  Administered 2017-06-27 – 2017-06-28 (×2): 40 mg via ORAL
  Filled 2017-06-27 (×2): qty 1

## 2017-06-27 MED ORDER — FENTANYL CITRATE (PF) 250 MCG/5ML IJ SOLN
INTRAMUSCULAR | Status: AC
  Filled 2017-06-27: qty 5

## 2017-06-27 MED ORDER — LACTATED RINGERS IV SOLN
INTRAVENOUS | Status: DC
  Administered 2017-06-27 (×3): via INTRAVENOUS

## 2017-06-27 MED ORDER — ENOXAPARIN SODIUM 40 MG/0.4ML ~~LOC~~ SOLN
40.0000 mg | Freq: Once | SUBCUTANEOUS | Status: AC
  Administered 2017-06-27: 40 mg via SUBCUTANEOUS

## 2017-06-27 MED ORDER — HYDROMORPHONE HCL 1 MG/ML IJ SOLN
1.0000 mg | INTRAMUSCULAR | Status: DC | PRN
  Administered 2017-06-27: 1 mg via INTRAVENOUS
  Filled 2017-06-27: qty 1

## 2017-06-27 MED ORDER — PROPOFOL 10 MG/ML IV BOLUS
INTRAVENOUS | Status: DC | PRN
  Administered 2017-06-27: 200 mg via INTRAVENOUS

## 2017-06-27 MED ORDER — LACTATED RINGERS IV SOLN
INTRAVENOUS | Status: DC
  Administered 2017-06-27 – 2017-06-28 (×2): via INTRAVENOUS

## 2017-06-27 MED ORDER — DIPHENHYDRAMINE HCL 50 MG/ML IJ SOLN
25.0000 mg | Freq: Four times a day (QID) | INTRAMUSCULAR | Status: DC | PRN
  Administered 2017-06-28: 25 mg via INTRAVENOUS
  Filled 2017-06-27: qty 1

## 2017-06-27 MED ORDER — FENTANYL CITRATE (PF) 100 MCG/2ML IJ SOLN
INTRAMUSCULAR | Status: AC
  Filled 2017-06-27: qty 2

## 2017-06-27 MED ORDER — OXYCODONE-ACETAMINOPHEN 5-325 MG PO TABS
1.0000 | ORAL_TABLET | ORAL | Status: DC | PRN
  Administered 2017-06-27 – 2017-06-29 (×7): 2 via ORAL
  Filled 2017-06-27 (×7): qty 2

## 2017-06-27 MED ORDER — AMLODIPINE BESYLATE 5 MG PO TABS
10.0000 mg | ORAL_TABLET | Freq: Every day | ORAL | Status: DC
  Administered 2017-06-28 – 2017-06-29 (×2): 10 mg via ORAL
  Filled 2017-06-27 (×2): qty 2

## 2017-06-27 MED ORDER — ONDANSETRON HCL 4 MG/2ML IJ SOLN: 4.0000 mg | Freq: Four times a day (QID) | INTRAMUSCULAR | Status: DC | PRN

## 2017-06-27 MED ORDER — ENOXAPARIN SODIUM 40 MG/0.4ML ~~LOC~~ SOLN
SUBCUTANEOUS | Status: AC
  Filled 2017-06-27: qty 0.4

## 2017-06-27 MED ORDER — BUPIVACAINE LIPOSOME 1.3 % IJ SUSP
INTRAMUSCULAR | Status: DC | PRN
  Administered 2017-06-27: 20 mL

## 2017-06-27 MED ORDER — ROCURONIUM 10MG/ML (10ML) SYRINGE FOR MEDFUSION PUMP - OPTIME
INTRAVENOUS | Status: DC | PRN
  Administered 2017-06-27: 5 mg via INTRAVENOUS
  Administered 2017-06-27: 10 mg via INTRAVENOUS
  Administered 2017-06-27: 30 mg via INTRAVENOUS
  Administered 2017-06-27 (×2): 10 mg via INTRAVENOUS

## 2017-06-27 MED ORDER — HYDROMORPHONE HCL 1 MG/ML IJ SOLN
0.2500 mg | INTRAMUSCULAR | Status: DC | PRN
  Administered 2017-06-27 (×3): 0.5 mg via INTRAVENOUS
  Filled 2017-06-27 (×3): qty 0.5

## 2017-06-27 MED ORDER — LIDOCAINE HCL (PF) 1 % IJ SOLN
INTRAMUSCULAR | Status: AC
  Filled 2017-06-27: qty 5

## 2017-06-27 MED ORDER — BUPIVACAINE LIPOSOME 1.3 % IJ SUSP
INTRAMUSCULAR | Status: AC
  Filled 2017-06-27: qty 20

## 2017-06-27 MED ORDER — ACETAMINOPHEN 650 MG RE SUPP: 650.0000 mg | Freq: Four times a day (QID) | RECTAL | Status: DC | PRN

## 2017-06-27 MED ORDER — LIDOCAINE HCL (CARDIAC) 10 MG/ML IV SOLN
INTRAVENOUS | Status: DC | PRN
  Administered 2017-06-27: 50 mg via INTRAVENOUS

## 2017-06-27 MED ORDER — EPHEDRINE SULFATE 50 MG/ML IJ SOLN
INTRAMUSCULAR | Status: DC | PRN
  Administered 2017-06-27: 5 mg via INTRAVENOUS

## 2017-06-27 MED ORDER — HEMOSTATIC AGENTS (NO CHARGE) OPTIME
TOPICAL | Status: DC | PRN
  Administered 2017-06-27: 1 via TOPICAL

## 2017-06-27 MED ORDER — ACETAMINOPHEN 325 MG PO TABS: 650.0000 mg | ORAL_TABLET | Freq: Four times a day (QID) | ORAL | Status: DC | PRN

## 2017-06-27 MED ORDER — GLYCOPYRROLATE 0.2 MG/ML IJ SOLN
INTRAMUSCULAR | Status: AC
  Filled 2017-06-27: qty 3

## 2017-06-27 MED ORDER — ISOSORBIDE MONONITRATE ER 60 MG PO TB24
60.0000 mg | ORAL_TABLET | Freq: Every day | ORAL | Status: DC
  Administered 2017-06-28 – 2017-06-29 (×2): 60 mg via ORAL
  Filled 2017-06-27 (×2): qty 1

## 2017-06-27 MED ORDER — ONDANSETRON HCL 4 MG/2ML IJ SOLN
4.0000 mg | Freq: Once | INTRAMUSCULAR | Status: AC
  Administered 2017-06-27: 4 mg via INTRAVENOUS
  Filled 2017-06-27: qty 2

## 2017-06-27 MED ORDER — ENOXAPARIN SODIUM 40 MG/0.4ML ~~LOC~~ SOLN
40.0000 mg | SUBCUTANEOUS | Status: DC
  Administered 2017-06-28 – 2017-06-29 (×2): 40 mg via SUBCUTANEOUS
  Filled 2017-06-27 (×2): qty 0.4

## 2017-06-27 MED ORDER — POVIDONE-IODINE 10 % OINT PACKET
TOPICAL_OINTMENT | CUTANEOUS | Status: DC | PRN
  Administered 2017-06-27: 1 via TOPICAL

## 2017-06-27 MED ORDER — POVIDONE-IODINE 10 % EX OINT
TOPICAL_OINTMENT | CUTANEOUS | Status: AC
  Filled 2017-06-27: qty 1

## 2017-06-27 MED ORDER — SUCCINYLCHOLINE CHLORIDE 20 MG/ML IJ SOLN
INTRAMUSCULAR | Status: AC
  Filled 2017-06-27: qty 1

## 2017-06-27 MED ORDER — METOPROLOL TARTRATE 50 MG PO TABS
100.0000 mg | ORAL_TABLET | Freq: Two times a day (BID) | ORAL | Status: DC
  Administered 2017-06-27 – 2017-06-29 (×4): 100 mg via ORAL
  Filled 2017-06-27 (×4): qty 2

## 2017-06-27 MED ORDER — NEOSTIGMINE METHYLSULFATE 10 MG/10ML IV SOLN
INTRAVENOUS | Status: AC
  Filled 2017-06-27: qty 1

## 2017-06-27 MED ORDER — FENTANYL CITRATE (PF) 100 MCG/2ML IJ SOLN
INTRAMUSCULAR | Status: DC | PRN
  Administered 2017-06-27 (×7): 50 ug via INTRAVENOUS

## 2017-06-27 SURGICAL SUPPLY — 50 items
BAG HAMPER (MISCELLANEOUS) ×2 IMPLANT
BLADE HEX COATED 2.75 (ELECTRODE) ×1 IMPLANT
CHLORAPREP W/TINT 26ML (MISCELLANEOUS) ×2 IMPLANT
CLOTH BEACON ORANGE TIMEOUT ST (SAFETY) ×2 IMPLANT
COVER LIGHT HANDLE STERIS (MISCELLANEOUS) ×4 IMPLANT
COVER MAYO STAND XLG (DRAPE) ×2 IMPLANT
DRAPE UTILITY W/TAPE 26X15 (DRAPES) ×4 IMPLANT
DRAPE WARM FLUID 44X44 (DRAPE) ×2 IMPLANT
DRSG OPSITE POSTOP 4X10 (GAUZE/BANDAGES/DRESSINGS) ×2 IMPLANT
ELECT BLADE 6 FLAT ULTRCLN (ELECTRODE) ×1 IMPLANT
ELECT REM PT RETURN 9FT ADLT (ELECTROSURGICAL) ×2
ELECTRODE REM PT RTRN 9FT ADLT (ELECTROSURGICAL) ×1 IMPLANT
GLOVE BIOGEL PI IND STRL 6.5 (GLOVE) IMPLANT
GLOVE BIOGEL PI IND STRL 7.0 (GLOVE) ×4 IMPLANT
GLOVE BIOGEL PI INDICATOR 6.5 (GLOVE) ×2
GLOVE BIOGEL PI INDICATOR 7.0 (GLOVE) ×4
GLOVE ECLIPSE 6.5 STRL STRAW (GLOVE) ×1 IMPLANT
GLOVE SURG SS PI 6.5 STRL IVOR (GLOVE) ×2 IMPLANT
GLOVE SURG SS PI 7.5 STRL IVOR (GLOVE) ×4 IMPLANT
GOWN STRL REUS W/ TWL XL LVL3 (GOWN DISPOSABLE) ×2 IMPLANT
GOWN STRL REUS W/TWL LRG LVL3 (GOWN DISPOSABLE) ×8 IMPLANT
GOWN STRL REUS W/TWL XL LVL3 (GOWN DISPOSABLE) ×4
HANDLE SUCTION POOLE (INSTRUMENTS) ×1 IMPLANT
INST SET MAJOR GENERAL (KITS) ×2 IMPLANT
KIT BLADEGUARD II DBL (SET/KITS/TRAYS/PACK) ×2 IMPLANT
KIT TURNOVER KIT A (KITS) ×2 IMPLANT
LIGASURE IMPACT 36 18CM CVD LR (INSTRUMENTS) ×2 IMPLANT
MANIFOLD NEPTUNE II (INSTRUMENTS) ×2 IMPLANT
NDL HYPO 21X1.5 SAFETY (NEEDLE) ×1 IMPLANT
NEEDLE HYPO 21X1.5 SAFETY (NEEDLE) ×2 IMPLANT
NS IRRIG 1000ML POUR BTL (IV SOLUTION) ×4 IMPLANT
PACK ABDOMINAL MAJOR (CUSTOM PROCEDURE TRAY) ×2 IMPLANT
PAD ARMBOARD 7.5X6 YLW CONV (MISCELLANEOUS) ×2 IMPLANT
PENCIL HANDSWITCHING (ELECTRODE) ×2 IMPLANT
RELOAD PROXIMATE 75MM BLUE (ENDOMECHANICALS) ×4 IMPLANT
RELOAD STAPLE 75 3.8 BLU REG (ENDOMECHANICALS) IMPLANT
RETRACTOR WND ALEXIS 25 LRG (MISCELLANEOUS) IMPLANT
RTRCTR WOUND ALEXIS 25CM LRG (MISCELLANEOUS) ×2
SET BASIN LINEN APH (SET/KITS/TRAYS/PACK) ×2 IMPLANT
SPONGE LAP 18X18 X RAY DECT (DISPOSABLE) ×2 IMPLANT
STAPLER GUN LINEAR PROX 60 (STAPLE) ×2 IMPLANT
STAPLER PROXIMATE 75MM BLUE (STAPLE) ×2 IMPLANT
STAPLER VISISTAT (STAPLE) ×2 IMPLANT
SUCTION POOLE HANDLE (INSTRUMENTS) ×2
SUCTION YANKAUER HANDLE (MISCELLANEOUS) ×2 IMPLANT
SUT NOVA NAB GS-26 0 60 (SUTURE) ×2 IMPLANT
SUT SILK 3 0 SH CR/8 (SUTURE) ×2 IMPLANT
SYR 20CC LL (SYRINGE) ×2 IMPLANT
TOWEL OR 17X26 4PK STRL BLUE (TOWEL DISPOSABLE) ×2 IMPLANT
TRAY FOLEY CATH SILVER 16FR (SET/KITS/TRAYS/PACK) ×2 IMPLANT

## 2017-06-27 NOTE — Transfer of Care (Signed)
Immediate Anesthesia Transfer of Care Note  Patient: Mike Davenport  Procedure(s) Performed: PARTIAL COLECTOMY (N/A Abdomen)  Patient Location: PACU  Anesthesia Type:General  Level of Consciousness: awake and alert   Airway & Oxygen Therapy: Patient Spontanous Breathing and Patient connected to face mask oxygen  Post-op Assessment: Report given to RN  Post vital signs: Reviewed and stable  Last Vitals:  Vitals:   06/27/17 0710 06/27/17 0720  BP: 109/72 110/70  Pulse:    Resp: (!) 25 16  Temp:    SpO2: 99% 99%    Last Pain:  Vitals:   06/27/17 0639  TempSrc: Oral      Patients Stated Pain Goal: 9 (38/17/71 1657)  Complications: No apparent anesthesia complications

## 2017-06-27 NOTE — Progress Notes (Signed)
Pt ambulated approx. 200 feet. Tolerated well. Pt is passing gas. Will continue to monitor.

## 2017-06-27 NOTE — Interval H&P Note (Signed)
History and Physical Interval Note:  06/27/2017 7:05 AM  Mike Davenport  has presented today for surgery, with the diagnosis of diverticulitis  The various methods of treatment have been discussed with the patient and family. After consideration of risks, benefits and other options for treatment, the patient has consented to  Procedure(s): PARTIAL COLECTOMY (N/A) as a surgical intervention .  The patient's history has been reviewed, patient examined, no change in status, stable for surgery.  I have reviewed the patient's chart and labs.  Questions were answered to the patient's satisfaction.     [name]

## 2017-06-27 NOTE — Anesthesia Preprocedure Evaluation (Signed)
Anesthesia Evaluation  Patient identified by MRN, date of birth, ID band Patient awake    Reviewed: Allergy & Precautions, NPO status , Patient's Chart, lab work & pertinent test results  Airway Mallampati: I  TM Distance: >3 FB Neck ROM: Full    Dental  (+) Teeth Intact, Dental Advisory Given   Pulmonary sleep apnea , former smoker,    breath sounds clear to auscultation       Cardiovascular hypertension, Pt. on medications + CAD and + Past MI   Rhythm:Regular Rate:Normal     Neuro/Psych  Headaches, Seizures -, Well Controlled,  PSYCHIATRIC DISORDERS Depression  Neuromuscular disease    GI/Hepatic GERD  ,  Endo/Other  Morbid obesity  Renal/GU      Musculoskeletal  (+) Arthritis ,   Abdominal   Peds  Hematology   Anesthesia Other Findings   Reproductive/Obstetrics                             Anesthesia Physical Anesthesia Plan  ASA: III  Anesthesia Plan: General   Post-op Pain Management:    Induction: Intravenous, Rapid sequence and Cricoid pressure planned  PONV Risk Score and Plan:   Airway Management Planned: Oral ETT  Additional Equipment:   Intra-op Plan:   Post-operative Plan: Extubation in OR  Informed Consent: I have reviewed the patients History and Physical, chart, labs and discussed the procedure including the risks, benefits and alternatives for the proposed anesthesia with the patient or authorized representative who has indicated his/her understanding and acceptance.     Plan Discussed with:   Anesthesia Plan Comments:         Anesthesia Quick Evaluation

## 2017-06-27 NOTE — Anesthesia Postprocedure Evaluation (Signed)
Anesthesia Post Note  Patient: Mike Davenport  Procedure(s) Performed: PARTIAL COLECTOMY (N/A Abdomen)  Patient location during evaluation: PACU Anesthesia Type: General Level of consciousness: awake and alert and oriented Pain management: pain level controlled Vital Signs Assessment: post-procedure vital signs reviewed and stable Respiratory status: spontaneous breathing Cardiovascular status: blood pressure returned to baseline and stable Postop Assessment: no apparent nausea or vomiting Anesthetic complications: no     Last Vitals:  Vitals:   06/27/17 0720 06/27/17 0924  BP: 110/70 123/82  Pulse:  79  Resp: 16 16  Temp:  36.7 C  SpO2: 99% 100%    Last Pain:  Vitals:   06/27/17 0942  TempSrc:   PainSc: 7                  Ronelle Michie

## 2017-06-27 NOTE — Anesthesia Procedure Notes (Signed)
Procedure Name: Intubation Date/Time: 06/27/2017 7:38 AM Performed by: Ollen Bowl, CRNA Pre-anesthesia Checklist: Patient identified, Patient being monitored, Timeout performed, Emergency Drugs available and Suction available Patient Re-evaluated:Patient Re-evaluated prior to induction Oxygen Delivery Method: Circle system utilized Preoxygenation: Pre-oxygenation with 100% oxygen Induction Type: IV induction Ventilation: Mask ventilation without difficulty Laryngoscope Size: Mac, 3 and 4 Grade View: Grade I Tube type: Oral Tube size: 7.0 mm Number of attempts: 2 Airway Equipment and Method: Stylet Placement Confirmation: ETT inserted through vocal cords under direct vision,  positive ETCO2 and breath sounds checked- equal and bilateral Secured at: 22 cm Tube secured with: Tape Dental Injury: Teeth and Oropharynx as per pre-operative assessment  Comments: Good view with Mac 4, Mac 3 little too short.

## 2017-06-27 NOTE — Op Note (Signed)
Patient:  Mike Davenport  DOB:  [birth year]  MRN:  [id number]   Preop Diagnosis: Sigmoid diverticulosis  Postop Diagnosis: Same  Procedure: Partial colectomy  Surgeon: [name], MD  Anes: General endotracheal  Indications: Patient is a 47 year old black male who has had recurrent episodes of sigmoid diverticulitis and now presents for a partial colectomy.  The risks and benefits of the procedure including bleeding, infection, anastomotic leak, and recurrence of the disease were fully explained to the patient, who gave informed consent.  Procedure note: The patient was placed in the low lithotomy position after induction of general endotracheal anesthesia.  The abdomen and perineum were prepped and draped using usual sterile technique with ChloraPrep.  Surgical site confirmation was performed.  A midline incision was made from the umbilicus to the suprapubic region.  The peritoneal cavity was entered into without difficulty.  The sigmoid colon was mobilized along its peritoneal reflection.  Care was taken to avoid the left ureter which was identified.  The distal sigmoid colon was noted to be involved with thickened tissue, consistent with his previous episodes of diverticulitis.  No purulent fluid was found.  A GIA stapler was placed across the distal sigmoid colon and fired.  The this was likewise done to the proximal sigmoid colon.  The mesentery was then divided using the LigaSure.  A side to side anastomosis was performed using a GIA 75 stapler.  The colotomy was closed using a TA 60 stapler.  The staple line was bolstered using 3-0 silk sutures.  A patent anastomosis was palpated.  The abdominal cavity was copiously irrigated with normal saline.  A small amount of Arista was used at the peritoneal reflection of the sigmoid colon.  No tension was noted on the anastomosis.  The bowel was returned into the abdominal cavity in an orderly fashion.  All operating personnel then changed her  gown and gloves.  A new setup was used for closure.  The fascia was reapproximated using a looped 0 Novafil running suture.  Subcutaneous layer was irrigated with normal saline.  Exparel was instilled into the surrounding wound.  The skin was closed using staples.  Betadine ointment and dry sterile dressings were applied.  All tape and needle counts were correct at the end of the procedure.  Patient was extubated in the operating room and transferred to PACU in stable condition.  Complications: None  EBL: 50 cc  Specimen: Sigmoid colon

## 2017-06-27 NOTE — Addendum Note (Signed)
Addendum  created 06/27/17 1101 by Ollen Bowl, CRNA   Intraprocedure Flowsheets edited

## 2017-06-27 NOTE — Progress Notes (Signed)
Pt had a large BM. Consistency was liquid and it was bloody. Re-assured patient that it was normal post- op. Will continue to monitor.

## 2017-06-28 ENCOUNTER — Encounter (HOSPITAL_COMMUNITY): Payer: Self-pay | Admitting: General Surgery

## 2017-06-28 LAB — CBC
HCT: 36.1 % — ABNORMAL LOW (ref 39.0–52.0)
HEMOGLOBIN: 11.8 g/dL — AB (ref 13.0–17.0)
MCH: 24.8 pg — ABNORMAL LOW (ref 26.0–34.0)
MCHC: 32.7 g/dL (ref 30.0–36.0)
MCV: 76 fL — ABNORMAL LOW (ref 78.0–100.0)
PLATELETS: 195 10*3/uL (ref 150–400)
RBC: 4.75 MIL/uL (ref 4.22–5.81)
RDW: 15.5 % (ref 11.5–15.5)
WBC: 19 10*3/uL — ABNORMAL HIGH (ref 4.0–10.5)

## 2017-06-28 LAB — BASIC METABOLIC PANEL
Anion gap: 11 (ref 5–15)
BUN: 12 mg/dL (ref 6–20)
CALCIUM: 9 mg/dL (ref 8.9–10.3)
CHLORIDE: 100 mmol/L — AB (ref 101–111)
CO2: 22 mmol/L (ref 22–32)
CREATININE: 0.99 mg/dL (ref 0.61–1.24)
Glucose, Bld: 104 mg/dL — ABNORMAL HIGH (ref 65–99)
Potassium: 3.6 mmol/L (ref 3.5–5.1)
SODIUM: 133 mmol/L — AB (ref 135–145)

## 2017-06-28 LAB — MAGNESIUM: MAGNESIUM: 1.4 mg/dL — AB (ref 1.7–2.4)

## 2017-06-28 LAB — PHOSPHORUS: PHOSPHORUS: 3.9 mg/dL (ref 2.5–4.6)

## 2017-06-28 MED ORDER — MAGNESIUM SULFATE 2 GM/50ML IV SOLN
2.0000 g | Freq: Once | INTRAVENOUS | Status: AC
Start: 2017-06-28 — End: 2017-06-28
  Administered 2017-06-28: 2 g via INTRAVENOUS
  Filled 2017-06-28: qty 50

## 2017-06-28 MED ORDER — TORSEMIDE 20 MG PO TABS
20.0000 mg | ORAL_TABLET | Freq: Every day | ORAL | Status: DC | PRN
  Administered 2017-06-28: 20 mg via ORAL
  Filled 2017-06-28: qty 1

## 2017-06-28 NOTE — Progress Notes (Signed)
1 Day Post-Op  Subjective: Patient had bloody bowel movement last night.  States he is passing gas.  Minimal incisional pain.  Objective: Vital signs in last 24 hours: Temp:  [97.5 F (36.4 C)-98.5 F (36.9 C)] 98.5 F (36.9 C) (03/05 0400) Pulse Rate:  [76-104] 98 (03/05 0400) Resp:  [15-19] 16 (03/05 0400) BP: (114-129)/(70-83) 123/70 (03/05 0400) SpO2:  [92 %-100 %] 100 % (03/05 0400) Last BM Date: 06/27/17  Intake/Output from previous day: 03/04 0701 - 03/05 0700 In: 4865 [P.O.:960; I.V.:3905] Out: 650 [Urine:600; Blood:50] Intake/Output this shift: No intake/output data recorded.  General appearance: alert, cooperative and no distress Resp: clear to auscultation bilaterally Cardio: regular rate and rhythm, S1, S2 normal, no murmur, click, rub or gallop GI: Soft with occasional bowel sounds appreciated.  Incision healing well.  Lab Results:  Recent Labs    06/28/17 0546  WBC 19.0*  HGB 11.8*  HCT 36.1*  PLT 195   BMET Recent Labs    06/28/17 0546  NA 133*  K 3.6  CL 100*  CO2 22  GLUCOSE 104*  BUN 12  CREATININE 0.99  CALCIUM 9.0   PT/INR No results for input(s): LABPROT, INR in the last 72 hours.  Studies/Results: No results found.  Anti-infectives: Anti-infectives (From admission, onward)   Start     Dose/Rate Route Frequency Ordered Stop   06/27/17 0629  ertapenem [facility]) 1 g in sodium chloride 0.9 % 100 mL IVPB     1 g 200 mL/hr over 30 Minutes Intravenous On call to O.R. 06/27/17 3007 06/27/17 0740      Assessment/Plan: s/p Procedure(s): PARTIAL COLECTOMY Impression: Stable on postoperative day 1.  Does have hypomagnesemia which will be addressed.  The Foley has been removed.  Will advance to full liquid diet.  Will continue Entereg for now and discontinue when patient has another good bowel movement.  LOS: 1 day    [name] 06/28/2017

## 2017-06-29 LAB — BASIC METABOLIC PANEL
Anion gap: 9 (ref 5–15)
BUN: 12 mg/dL (ref 6–20)
CALCIUM: 8.7 mg/dL — AB (ref 8.9–10.3)
CO2: 26 mmol/L (ref 22–32)
CREATININE: 0.98 mg/dL (ref 0.61–1.24)
Chloride: 101 mmol/L (ref 101–111)
GFR calc Af Amer: >60 mL/min (ref >60)
GFR calc non Af Amer: >60 mL/min (ref >60)
GLUCOSE: 77 mg/dL (ref 65–99)
Potassium: 3.6 mmol/L (ref 3.5–5.1)
Sodium: 136 mmol/L (ref 135–145)

## 2017-06-29 LAB — CBC
HEMATOCRIT: 35.1 % — AB (ref 39.0–52.0)
Hemoglobin: 11.3 g/dL — ABNORMAL LOW (ref 13.0–17.0)
MCH: 24.5 pg — ABNORMAL LOW (ref 26.0–34.0)
MCHC: 32.2 g/dL (ref 30.0–36.0)
MCV: 76 fL — AB (ref 78.0–100.0)
Platelets: 180 10*3/uL (ref 150–400)
RBC: 4.62 MIL/uL (ref 4.22–5.81)
RDW: 15.4 % (ref 11.5–15.5)
WBC: 10.7 10*3/uL — AB (ref 4.0–10.5)

## 2017-06-29 LAB — PHOSPHORUS: Phosphorus: 3.8 mg/dL (ref 2.5–4.6)

## 2017-06-29 LAB — MAGNESIUM: Magnesium: 1.7 mg/dL (ref 1.7–2.4)

## 2017-06-29 MED ORDER — OXYCODONE-ACETAMINOPHEN 5-325 MG PO TABS: 1.0000 | ORAL_TABLET | ORAL | 0 refills | Status: DC | PRN

## 2017-06-29 NOTE — Progress Notes (Signed)
Patient IV removed, tolerated well. Patient given discharge instructions.

## 2017-06-29 NOTE — Addendum Note (Signed)
Addendum  created 06/29/17 0802 by Mickel Baas, CRNA   Sign clinical note

## 2017-06-29 NOTE — Anesthesia Postprocedure Evaluation (Signed)
Anesthesia Post Note  Patient: Mike Davenport  Procedure(s) Performed: PARTIAL COLECTOMY (N/A Abdomen)  Patient location during evaluation: Nursing Unit Anesthesia Type: General Level of consciousness: awake and alert, oriented and patient cooperative Pain management: pain level controlled Vital Signs Assessment: post-procedure vital signs reviewed and stable Respiratory status: spontaneous breathing and respiratory function stable Cardiovascular status: stable Postop Assessment: no apparent nausea or vomiting Anesthetic complications: no     Last Vitals:  Vitals:   06/28/17 2205 06/29/17 0618  BP: 119/67 119/71  Pulse: 76 88  Resp: 18 18  Temp: 36.8 C 36.7 C  SpO2: 100% 100%    Last Pain:  Vitals:   06/29/17 0618  TempSrc: Oral  PainSc:                  Judyann Casasola A

## 2017-06-29 NOTE — Discharge Summary (Signed)
Physician Discharge Summary  Patient ID: Mike Davenport MRN: [id number] DOB/AGE: 02-Jul-1970 47 y.o.  Admit date: 06/27/2017 Discharge date: 06/29/2017  Admission Diagnoses: Recurrent sigmoid diverticulitis  Discharge Diagnoses: Same Active Problems:   Sigmoid diverticulosis   S/P partial colectomy   Discharged Condition: good  Hospital Course: Patient is a 47 year old black male who presented for an elective sigmoid colectomy due to recurrent episodes of sigmoid diverticulitis.  He underwent a partial colectomy on 06/27/2017.  He tolerated the procedure well.  His postoperative course was unremarkable.  His diet was advanced without difficulty.  Final pathology still pending.  He is being discharged home on 06/29/2017 in good and improving condition.  Treatments: surgery: Partial colectomy on 06/27/2017  Discharge Exam: Blood pressure 119/71, pulse 88, temperature 98 F (36.7 C), temperature source Oral, resp. rate 18, height 5\' 10"  (1.778 m), weight 293 lb (132.9 kg), SpO2 100 %. General appearance: alert, cooperative and no distress Resp: clear to auscultation bilaterally Cardio: regular rate and rhythm, S1, S2 normal, no murmur, click, rub or gallop GI: Soft, incision healing well.  Bowel sounds active.  Disposition: 01-Home or Self Care  Discharge Instructions    Diet - low sodium heart healthy   Complete by:  As directed    Increase activity slowly   Complete by:  As directed      Allergies as of 06/29/2017      Reactions   Other Anaphylaxis   [place] nuts      Medication List    STOP taking these medications   docusate sodium 100 MG capsule Commonly known as:  COLACE   metroNIDAZOLE 250 MG tablet Commonly known as:  FLAGYL   neomycin 500 MG tablet Commonly known as:  MYCIFRADIN     TAKE these medications   amLODipine 10 MG tablet Commonly known as:  NORVASC TAKE 1 TABLET (10 MG TOTAL) BY MOUTH DAILY.   aspirin EC 81 MG tablet Take 81 mg by mouth daily.    atorvastatin 40 MG tablet Commonly known as:  LIPITOR Take 40 mg by mouth at bedtime.   hydrochlorothiazide 25 MG tablet Commonly known as:  HYDRODIURIL TAKE 1 TABLET (25 MG TOTAL) BY MOUTH DAILY. What changed:    how much to take  how to take this  when to take this  additional instructions   isosorbide mononitrate 60 MG 24 hr tablet Commonly known as:  IMDUR Take 1 tablet (60 mg total) by mouth daily.   KLOR-CON M20 20 MEQ tablet Generic drug:  potassium chloride SA Take 20 mEq by mouth every Monday, Wednesday, and Friday.   metoprolol tartrate 100 MG tablet Commonly known as:  LOPRESSOR TAKE 1 TABLET (100 MG TOTAL) BY MOUTH 2 (TWO) TIMES DAILY.   nitroGLYCERIN 0.4 MG SL tablet Commonly known as:  NITROSTAT Place 1 tablet (0.4 mg total) under the tongue every 5 (five) minutes as needed for chest pain.   NON FORMULARY Place 1 each into the nose at bedtime. CPAP   oxyCODONE-acetaminophen 5-325 MG tablet Commonly known as:  PERCOCET/ROXICET Take 1 tablet by mouth every 4 (four) hours as needed for moderate pain.   pantoprazole 40 MG tablet Commonly known as:  PROTONIX Take 1 tablet (40 mg total) by mouth daily.   torsemide 20 MG tablet Commonly known as:  DEMADEX TAKE 1 TABLET (20 MG TOTAL) BY MOUTH DAILY AS NEEDED (SWELLING).   Vitamin D (Ergocalciferol) 50000 units Caps capsule Commonly known as:  DRISDOL Take 50,000 Units by mouth  2 (two) times a week. Sunday and Wednesday      Follow-up Information    [name], MD. Schedule an appointment as soon as possible for a visit on 07/07/2017.   Specialty:  General Surgery Contact information: [address] [place] [address] [phone]           Signed: [name] 06/29/2017, 9:10 AM

## 2017-06-29 NOTE — Discharge Instructions (Signed)
Open Colectomy, Care After °This sheet gives you information about how to care for yourself after your procedure. Your health care provider may also give you more specific instructions. If you have problems or questions, contact your health care provider. °What can I expect after the procedure? °After the procedure, it is common to have: °· Pain in your abdomen, especially along your incision. °· Tiredness. Your energy level will return to normal over the next several weeks. °· Constipation. °· Nausea. °· Difficulty urinating. ° °Follow these instructions at home: °Activity °· You may be able to return to most of your normal activities within 1-2 weeks, such as working, walking up stairs, and sexual activity. °· Avoid activities that require a lot of energy for 4-6 weeks after surgery, such as running, climbing, and lifting heavy objects. Ask your health care provider what activities are safe for you. °· Take rest breaks during the day as needed. °· Do not drive for 1-2 weeks or until your health care provider says that it is safe. °· Do not drive or use heavy machinery while taking prescription pain medicines. °· Do not lift anything that is heavier than 10 lb (4.3 kg) until your health care provider says that it is safe. °Incision care °· Follow instructions from your health care provider about how to take care of your incision. Make sure you: °? Wash your hands with soap and water before you change your bandage (dressing). If soap and water are not available, use hand sanitizer. °? Change your dressing as told by your health care provider. °? Leave stitches (sutures) or staples in place. These skin closures may need to stay in place for 2 weeks or longer. °· Avoid wearing tight clothing around your incision. °· Protect your incision area from the sun. °· Check your incision area every day for signs of infection. Check for: °? More redness, swelling, or pain. °? More fluid or blood. °? Warmth. °? Pus or a bad  smell. °General instructions °· Do not take baths, swim, or use a hot tub until your health care provider approves. Ask your health care provider when you may shower. °· Take over-the-counter and prescription medicines, including stool softeners, only as told by your health care provider. °· Eat a low-fat and low-fiber diet for the first 4 weeks after surgery. °· Keep all follow-up visits as told by your health care provider. This is important. °Contact a health care provider if: °· You have more redness, swelling, or pain around your incision. °· You have more fluid or blood coming from your incision. °· Your incision feels warm to the touch. °· You have pus or a bad smell coming from your incision. °· You have a fever or chills. °· You do not have a bowel movement 2-3 days after surgery. °· You cannot eat or drink for 24 hours or more. °· You have persistent nausea and vomiting. °· You have abdominal pain that gets worse and does not get better with medicine. °Get help right away if: °· You have chest pain. °· You have shortness of breath. °· You have pain or swelling in your legs. °· Your incision breaks open after your sutures or staples have been removed. °· You have bleeding from the rectum. °This information is not intended to replace advice given to you by your health care provider. Make sure you discuss any questions you have with your health care provider. °Document Released: 11/03/2010 Document Revised: 01/12/2016 Document Reviewed: 01/12/2016 °Elsevier Interactive Patient   Education © 2018 [employer] ° °

## 2017-06-30 LAB — TYPE AND SCREEN
ABO/RH(D): B POS
Antibody Screen: NEGATIVE
UNIT DIVISION: 0
Unit division: 0

## 2017-06-30 LAB — BPAM RBC
BLOOD PRODUCT EXPIRATION DATE: 201903192359
Blood Product Expiration Date: 201903282359
ISSUE DATE / TIME: 201903070831
ISSUE DATE / TIME: 201903071100
UNIT TYPE AND RH: 5100
UNIT TYPE AND RH: 5100

## 2017-07-07 ENCOUNTER — Ambulatory Visit (INDEPENDENT_AMBULATORY_CARE_PROVIDER_SITE_OTHER): Payer: Self-pay | Admitting: General Surgery

## 2017-07-07 ENCOUNTER — Encounter: Payer: Self-pay | Admitting: General Surgery

## 2017-07-07 VITALS — BP 130/83 | HR 72 | Temp 98.4°F | Ht 70.0 in | Wt 294.0 lb

## 2017-07-07 DIAGNOSIS — Z09 Encounter for follow-up examination after completed treatment for conditions other than malignant neoplasm: Secondary | ICD-10-CM

## 2017-07-07 NOTE — Progress Notes (Signed)
Subjective:     Mike Davenport  Status post partial colectomy.  Patient doing well.  Does have incisional pain, but his preoperative left lower quadrant pain has resolved.  He denies any fever or chills.  He is tolerating a regular diet well.  He is having regular bowel movements. Objective:    BP 130/83   Pulse 72   Temp 98.4 F (36.9 C)   Ht 5\' 10"  (1.778 m)   Wt 294 lb (133.4 kg)   BMI 42.18 kg/m   General:  alert, cooperative and no distress  Abdomen soft, incision healing well.  Staples removed, Steri-Strips applied. Final pathology consistent with diagnosis.     Assessment:    Doing well postoperatively.    Plan:   Increase activity as able.  Follow-up here as needed.

## 2017-07-16 ENCOUNTER — Other Ambulatory Visit: Payer: Self-pay | Admitting: Cardiovascular Disease

## 2017-07-21 ENCOUNTER — Ambulatory Visit (INDEPENDENT_AMBULATORY_CARE_PROVIDER_SITE_OTHER): Payer: Self-pay | Admitting: General Surgery

## 2017-07-21 ENCOUNTER — Encounter: Payer: Self-pay | Admitting: General Surgery

## 2017-07-21 VITALS — BP 131/92 | HR 88 | Temp 97.5°F | Ht 70.0 in | Wt 289.0 lb

## 2017-07-21 DIAGNOSIS — Z09 Encounter for follow-up examination after completed treatment for conditions other than malignant neoplasm: Secondary | ICD-10-CM

## 2017-07-21 MED ORDER — OXYCODONE-ACETAMINOPHEN 5-325 MG PO TABS: 1.0000 | ORAL_TABLET | ORAL | 0 refills | Status: DC | PRN

## 2017-07-21 NOTE — Addendum Note (Signed)
Addended by: [name] A on: 07/21/2017 12:21 PM   Modules accepted: Orders

## 2017-07-21 NOTE — Progress Notes (Signed)
Subjective:     Mike Davenport  Status post partial colectomy approximately 3 weeks ago.  Patient over the last 2 days started having serosanguineous drainage from upper portion of the incision.  He denies any fevers. Objective:    BP (!) 131/92   Pulse 88   Temp (!) 97.5 F (36.4 C)   Ht 5\' 10"  (1.778 m)   Wt 289 lb (131.1 kg)   BMI 41.47 kg/m   General:  alert, cooperative and no distress  Abdomen soft.  Superficial skin bulla with serosanguineous drainage present in upper wound.  This was cleaned with a Q-tip and peroxide.  Fascia intact.     Assessment:    Superficial wound along the incision secondary to hematoma, resolving.  No evidence of infection.    Plan:  Clean wound with Q-tip and peroxide daily.  May use soap and water.  Will see patient again in 1 week for follow-up.

## 2017-07-28 ENCOUNTER — Encounter: Payer: Self-pay | Admitting: General Surgery

## 2017-07-28 ENCOUNTER — Ambulatory Visit: Payer: Medicare Other | Admitting: General Surgery

## 2017-07-28 ENCOUNTER — Ambulatory Visit (INDEPENDENT_AMBULATORY_CARE_PROVIDER_SITE_OTHER): Payer: Self-pay | Admitting: General Surgery

## 2017-07-28 VITALS — BP 129/85 | HR 78 | Temp 98.0°F | Ht 70.0 in | Wt 289.0 lb

## 2017-07-28 DIAGNOSIS — Z09 Encounter for follow-up examination after completed treatment for conditions other than malignant neoplasm: Secondary | ICD-10-CM

## 2017-07-28 NOTE — Progress Notes (Signed)
Subjective:     Mike Davenport  Here for wound follow-up.  Patient states he still has some yellowish drainage from the wound.  He denies any fevers.  He has minimal pain along the incision. Objective:    BP 129/85   Pulse 78   Temp 98 F (36.7 C)   Ht 5\' 10"  (1.778 m)   Wt 289 lb (131.1 kg)   BMI 41.47 kg/m   General:  alert, cooperative and no distress  Abdomen is soft.  The upper portion of the incision has healed over by secondary intention.  Minimal serous drainage noted.  No purulent drainage present.  No erythema present.     Assessment:    Superficial wound dehiscence healing well by secondary intention.    Plan:   May stop Q-tip and peroxide.  Keep wound clean and dry with soap and water.  Follow-up here in 2 weeks.

## 2017-08-04 ENCOUNTER — Encounter: Payer: Self-pay | Admitting: Nurse Practitioner

## 2017-08-09 ENCOUNTER — Ambulatory Visit (INDEPENDENT_AMBULATORY_CARE_PROVIDER_SITE_OTHER): Payer: Self-pay | Admitting: General Surgery

## 2017-08-09 ENCOUNTER — Encounter: Payer: Self-pay | Admitting: General Surgery

## 2017-08-09 VITALS — BP 152/89 | HR 90 | Temp 97.8°F | Ht 70.0 in | Wt 284.0 lb

## 2017-08-09 DIAGNOSIS — Z09 Encounter for follow-up examination after completed treatment for conditions other than malignant neoplasm: Secondary | ICD-10-CM

## 2017-08-09 NOTE — Progress Notes (Signed)
Subjective:     Mike Davenport  Here for wound check.  Patient was seen in the emergency room at [facility] 4 days ago for fever of unknown etiology.  A CT scan was done at that time which revealed no intra-abdominal infection or abscess.  He still has an opening along the superior aspect of incision, but no purulent drainage has been noted.  He was started on an antibiotic by the ER.  He states the drainage from his wound has been serosanguineous in nature, but decreasing.  He has been applying Q-tip and peroxide to the wound.  He still has a sore throat and cough. Objective:    BP (!) 152/89   Pulse 90   Temp 97.8 F (36.6 C)   Ht 5\' 10"  (1.778 m)   Wt 284 lb (128.8 kg)   BMI 40.75 kg/m   General:  alert, cooperative and no distress  Abdomen is soft, nontender, nondistended.  A small blister along the superior aspect of the surgical wound is present with granulation tissue present.  No induration or purulent drainage is noted.     Assessment:    Superficial wound granulation, resolving.  No evidence of infection.    Plan:  Continue current wound care until wound is closed.  Follow-up here as needed.

## 2017-08-11 ENCOUNTER — Ambulatory Visit: Payer: Medicare Other | Admitting: General Surgery

## 2017-08-23 ENCOUNTER — Other Ambulatory Visit: Payer: Self-pay | Admitting: Cardiovascular Disease

## 2017-09-21 ENCOUNTER — Other Ambulatory Visit: Payer: Self-pay | Admitting: Nurse Practitioner

## 2017-09-21 DIAGNOSIS — K219 Gastro-esophageal reflux disease without esophagitis: Secondary | ICD-10-CM

## 2017-09-21 DIAGNOSIS — K5732 Diverticulitis of large intestine without perforation or abscess without bleeding: Secondary | ICD-10-CM

## 2017-10-24 ENCOUNTER — Ambulatory Visit: Payer: Medicare Other | Admitting: Nurse Practitioner

## 2017-10-24 NOTE — Progress Notes (Signed)
Referring Provider: Octavio Graves, DO Primary Care Physician:  Octavio Graves, DO Primary GI:  [name]  Chief Complaint  Patient presents with  . Abdominal Pain    llq, had partial colectomy    HPI:   Mike Davenport is a 47 y.o. male who presents for 6 months follow-up.  The patient was last seen in our office 05/18/2017 for diverticulitis and GERD.  Previously diagnosed with diverticulitis at [facility] in March 2018 with CT of the abdomen and pelvis noting of your sigmoid colon diverticulosis with superimposed segment of pericolonic fat stranding, severe canal stenosis at multiple lumbar levels.  Colonoscopy up-to-date 2018.  She was again admitted to [facility] at a later date for diverticulitis with CT imaging demonstrating acute sigmoid diverticulitis with extensive phlegmonous inflammation in the mid sigmoid mesocolon without discrete diverticular abscess or frank perforation.  She was treated with Cipro and Flagyl.  Previous colonoscopy 01/24/2017 with tubular adenoma polyps and recommended repeat in 2021.  At his last visit he was doing well  At her last visit she stated currently no abdominal pain.  Occasional nausea but no vomiting.  GERD moderately well controlled and taking omeprazole "for a while."  Some breakthrough symptoms about once a week.  No other GI symptoms.  Recommended stop omeprazole, start Protonix 40 mg daily, previous records requested from [facility], referral to local surgeon for possible colectomy, follow-up in 6 months.  Patient saw Dr. Arnoldo Morale in surgery and scheduled for partial colectomy 06/27/2017.  This was completed as planned.  His postop care is complicated by admission to [facility] for fever of unknown etiology with no intra-abdominal abscess noted.  Incision draining well but still opening along the superior aspect without purulent drainage.  He was started on antibiotics by the emergency department.  Drainage decreasing and  serosanguineous in nature.  Recommended continue wound care and follow-up as needed.  Today he states he's doing ok overall. He has intermittent "every now and then" pain in his LLQ which he states he can deal with. Denies other abdominal pain, N/V, fever, chills, hematochezia, melena, unintentional weight loss. Drainage has stopped and he states it appears to have completely closed up. GERD is doing well on daily PPI. Denies GERD breakthrough. Denies chest pain, dyspnea, dizziness, lightheadedness, syncope, near syncope. Denies any other upper or lower GI symptoms.  Past Medical History:  Diagnosis Date  . Carpal tunnel syndrome   . Condylomata acuminata in male   . Coronary artery disease   . Diverticulitis   . GERD (gastroesophageal reflux disease)   . Gout   . Hyperlipidemia   . Hypertension   . Knee pain, bilateral   . Legal blindness   . Lumbar degenerative disc disease   . Myocardial infarction ([place]) 11/2014  . Obesity   . OSA (obstructive sleep apnea)   . Seizures ([place])    from accident; but on no meds and no more seizures since then.  . Tobacco abuse   . Vitamin D deficiency     Past Surgical History:  Procedure Laterality Date  . BIOPSY  09/27/2016   Procedure: BIOPSY;  Surgeon: [name], MD;  [facility];  Service: Endoscopy;;  esophagus and gastric  . COLONOSCOPY N/A 01/24/2017   Procedure: COLONOSCOPY;  Surgeon: [name], MD;  [facility];  Service: Endoscopy;  Laterality: N/A;  1:00pm  . CORONARY ANGIOPLASTY  2016  . ESOPHAGOGASTRODUODENOSCOPY N/A 09/27/2016   Procedure: ESOPHAGOGASTRODUODENOSCOPY (  EGD);  Surgeon: [name], MD;  [facility];  Service: Endoscopy;  Laterality: N/A;  300  . 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   Marland Kitchen PARTIAL COLECTOMY N/A 06/27/2017   Procedure: PARTIAL COLECTOMY;  Surgeon: [name], MD;  Location: [facility];   Service: General;  Laterality: N/A;  . SAVORY DILATION N/A 09/27/2016   Procedure: SAVORY DILATION;  Surgeon: [name], MD;  [facility];  Service: Endoscopy;  Laterality: N/A;    Current Outpatient Medications  Medication Sig Dispense Refill  . amLODipine (NORVASC) 10 MG tablet TAKE 1 TABLET (10 MG TOTAL) BY MOUTH DAILY. 30 tablet 0  . aspirin EC 81 MG tablet Take 81 mg by mouth daily.    Marland Kitchen atorvastatin (LIPITOR) 40 MG tablet Take 40 mg by mouth at bedtime.   2  . hydrochlorothiazide (HYDRODIURIL) 25 MG tablet TAKE 1 TABLET (25 MG TOTAL) BY MOUTH DAILY. (Patient taking differently: Take 25 mg by mouth every Monday, Wednesday, and Friday. IN THE MORNING) 30 tablet 0  . isosorbide mononitrate (IMDUR) 60 MG 24 hr tablet TAKE 1 TABLET BY MOUTH EVERY DAY 15 tablet 0  . KLOR-CON M20 20 MEQ tablet Take 20 mEq by mouth every Monday, Wednesday, and Friday.   11  . metoprolol (LOPRESSOR) 100 MG tablet TAKE 1 TABLET (100 MG TOTAL) BY MOUTH 2 (TWO) TIMES DAILY. 60 tablet 6  . nitroGLYCERIN (NITROSTAT) 0.4 MG SL tablet Place 1 tablet (0.4 mg total) under the tongue every 5 (five) minutes as needed for chest pain. 25 tablet 3  . NON FORMULARY Place 1 each into the nose at bedtime. CPAP    . pantoprazole (PROTONIX) 40 MG tablet Take 1 tablet (40 mg total) by mouth daily. 90 tablet 3  . torsemide (DEMADEX) 20 MG tablet TAKE 1 TABLET (20 MG TOTAL) BY MOUTH DAILY AS NEEDED (SWELLING). 30 tablet 3   No current facility-administered medications for this visit.     Allergies as of 10/25/2017 - Review Complete 10/25/2017  Allergen Reaction Noted  . Other Anaphylaxis 06/07/2011    Family History  Problem Relation Age of Onset  . High blood pressure Mother   . Cancer Maternal Grandmother   . Colon cancer Neg Hx     Social History   Socioeconomic History  . Marital status: Married    Spouse name: Not on file  . Number of children: Not on file  . Years of education: Not on file  .  Highest education level: Not on file  Occupational History  . Occupation: shipping and recieving    Comment: employer: [employer]   Social Needs  . Financial resource strain: Not on file  . Food insecurity:    Worry: Not on file    Inability: Not on file  . Transportation needs:    Medical: Not on file    Non-medical: Not on file  Tobacco Use  . Smoking status: Former Smoker    Packs/day: 0.50    Types: Cigarettes    Last attempt to quit: 04/28/2014    Years since quitting: 3.4  . Smokeless tobacco: Never Used  . Tobacco comment: decreased smoking  Substance and Sexual Activity  . Alcohol use: No  . Drug use: No  . Sexual activity: Yes    Birth control/protection: None  Lifestyle  . Physical activity:    Days per week: Not on  file    Minutes per session: Not on file  . Stress: Not on file  Relationships  . Social connections:    Talks on phone: Not on file    Gets together: Not on file    Attends religious service: Not on file    Active member of club or organization: Not on file    Attends meetings of clubs or organizations: Not on file    Relationship status: Not on file  Other Topics Concern  . Not on file  Social History Narrative   Pt currently lives with brother and sister in law     Review of Systems: Complete ROS negative except as per HPI.   Physical Exam: BP 135/84   Pulse 73   Temp (!) 97.3 F (36.3 C) (Oral)   Ht 5\' 10"  (1.778 m)   Wt 291 lb (132 kg)   BMI 41.75 kg/m  General:   Alert and oriented. Pleasant and cooperative. Well-nourished and well-developed.  Eyes:  Without icterus, sclera clear and conjunctiva pink.  Ears:  Normal auditory acuity. Cardiovascular:  S1, S2 present without murmurs appreciated. Extremities without clubbing or edema. Respiratory:  Clear to auscultation bilaterally. No wheezes, rales, or rhonchi. No distress.  Gastrointestinal:  +BS, soft, non-tender and non-distended. No HSM noted. No guarding or rebound. No masses  appreciated.  Rectal:  Deferred  Musculoskalatal:  Symmetrical without gross deformities. Skin: infraumbilical incisional scar appears well healed, completely closed and without edema, erythema, drainage. Neurologic:  Alert and oriented x4;  grossly normal neurologically. Psych:  Alert and cooperative. Normal mood and affect. Heme/Lymph/Immune: No excessive bruising noted.    10/25/2017 9:00 AM   Disclaimer: This note was dictated with voice recognition software. Similar sounding words can inadvertently be transcribed and may not be corrected upon review.

## 2017-10-25 ENCOUNTER — Ambulatory Visit (INDEPENDENT_AMBULATORY_CARE_PROVIDER_SITE_OTHER): Payer: Medicare Other | Admitting: Nurse Practitioner

## 2017-10-25 ENCOUNTER — Encounter: Payer: Self-pay | Admitting: Nurse Practitioner

## 2017-10-25 VITALS — BP 135/84 | HR 73 | Temp 97.3°F | Ht 70.0 in | Wt 291.0 lb

## 2017-10-25 DIAGNOSIS — K219 Gastro-esophageal reflux disease without esophagitis: Secondary | ICD-10-CM

## 2017-10-25 DIAGNOSIS — K5732 Diverticulitis of large intestine without perforation or abscess without bleeding: Secondary | ICD-10-CM

## 2017-10-25 MED ORDER — PANTOPRAZOLE SODIUM 40 MG PO TBEC: 40.0000 mg | DELAYED_RELEASE_TABLET | Freq: Every day | ORAL | 3 refills | Status: DC

## 2017-10-25 NOTE — Progress Notes (Signed)
cc'ed to pcp °

## 2017-10-25 NOTE — Assessment & Plan Note (Deleted)
Repeated bouts of diverticulitis in the sigmoid colon.  He has since undergone sigmoid colectomy.  This should prevent further episodes of diverticulitis.  Follow-up in 1 year or sooner if needed.

## 2017-10-25 NOTE — Assessment & Plan Note (Signed)
GERD symptoms doing well.  PPI manages his heartburn, no breakthrough symptoms.  Recommend he continue his PPI.  I refilled this for him today.  Follow-up in 1 year.  Call if any worsening symptoms or concerns.

## 2017-10-25 NOTE — Patient Instructions (Signed)
1. Keep taking your acid blocker. 2. Continue your other medications. 3. I have sent a refill of your pantoprazole (Protonix) to your pharmacy. 4. Return for follow-up in 1 year. 5. Call us if you have any questions or concerns.  At [facility] we value your feedback. You may receive a survey about your visit today. Please share your experience as we strive to create trusting relationships with our patients to provide genuine, compassionate, quality care.  It was great seeing you both today!  I hope you have a wonderful 4th of July!

## 2017-10-25 NOTE — Assessment & Plan Note (Addendum)
Patient recently underwent sigmoid colectomy for repeated bouts of sigmoid diverticulitis.  He had a mildly complicated postoperative state with persistent drainage and delayed healing.  However, his incisional scars are completely healed and no further drainage.  There is no sign of infection.  Overall he states he is glad he had the surgery done.  Recommend he continue surgical recommendations, follow-up in 1 year.  Call us if any worsening symptoms before then.

## 2018-04-17 ENCOUNTER — Other Ambulatory Visit: Payer: Self-pay | Admitting: Gastroenterology

## 2018-04-17 DIAGNOSIS — K219 Gastro-esophageal reflux disease without esophagitis: Secondary | ICD-10-CM

## 2018-04-17 DIAGNOSIS — K5732 Diverticulitis of large intestine without perforation or abscess without bleeding: Secondary | ICD-10-CM

## 2018-10-17 ENCOUNTER — Encounter: Payer: Self-pay | Admitting: Gastroenterology

## 2019-03-18 ENCOUNTER — Other Ambulatory Visit: Payer: Self-pay | Admitting: Gastroenterology

## 2019-03-18 DIAGNOSIS — K219 Gastro-esophageal reflux disease without esophagitis: Secondary | ICD-10-CM

## 2019-03-18 DIAGNOSIS — K5732 Diverticulitis of large intestine without perforation or abscess without bleeding: Secondary | ICD-10-CM

## 2019-06-29 ENCOUNTER — Other Ambulatory Visit: Payer: Self-pay | Admitting: Family

## 2019-06-29 ENCOUNTER — Other Ambulatory Visit (HOSPITAL_COMMUNITY): Payer: Self-pay | Admitting: Family

## 2019-06-29 DIAGNOSIS — R109 Unspecified abdominal pain: Secondary | ICD-10-CM

## 2019-07-12 ENCOUNTER — Ambulatory Visit (HOSPITAL_COMMUNITY)
Admission: RE | Admit: 2019-07-12 | Discharge: 2019-07-12 | Disposition: A | Discharge status: Home / Self Care | Payer: Medicare Other | Source: Ambulatory Visit | Attending: Family | Admitting: Family

## 2019-07-12 ENCOUNTER — Other Ambulatory Visit: Payer: Self-pay

## 2019-07-12 ENCOUNTER — Ambulatory Visit (HOSPITAL_COMMUNITY): Payer: Medicare Other

## 2019-07-12 DIAGNOSIS — R109 Unspecified abdominal pain: Secondary | ICD-10-CM | POA: Diagnosis present

## 2019-07-16 ENCOUNTER — Ambulatory Visit: Payer: Medicare Other | Attending: Internal Medicine

## 2019-07-16 DIAGNOSIS — Z23 Encounter for immunization: Secondary | ICD-10-CM

## 2019-07-16 NOTE — Progress Notes (Signed)
   Covid-19 Vaccination Clinic  Name:  Mike Davenport    MRN: [id number] DOB: Feb 23, 1971  07/16/2019  Mike Davenport was observed post Covid-19 immunization for 15 minutes without incident. He was provided with Vaccine Information Sheet and instruction to access the V-Safe system.   Mike Davenport was instructed to call 911 with any severe reactions post vaccine: Marland Kitchen Difficulty breathing  . Swelling of face and throat  . A fast heartbeat  . A bad rash all over body  . Dizziness and weakness   Immunizations Administered    Name Date Dose VIS Date Route   Pfizer COVID-19 Vaccine 07/16/2019  8:57 AM 0.3 mL 04/06/2019 Intramuscular   Manufacturer: [place]   Lot: [id number]   [place]: [id number]

## 2019-08-08 ENCOUNTER — Ambulatory Visit: Payer: Medicare Other

## 2019-08-13 ENCOUNTER — Ambulatory Visit: Payer: Medicare Other | Attending: Internal Medicine

## 2019-08-13 DIAGNOSIS — Z23 Encounter for immunization: Secondary | ICD-10-CM

## 2019-08-13 NOTE — Progress Notes (Signed)
   Covid-19 Vaccination Clinic  Name:  BREWER LESHKO    MRN: [id number] DOB: Apr 19, 1971  08/13/2019  Mr. Fluharty was observed post Covid-19 immunization for 15 minutes without incident. He was provided with Vaccine Information Sheet and instruction to access the V-Safe system.   Mr. Mozley was instructed to call 911 with any severe reactions post vaccine: Marland Kitchen Difficulty breathing  . Swelling of face and throat  . A fast heartbeat  . A bad rash all over body  . Dizziness and weakness   Immunizations Administered    Name Date Dose VIS Date Route   Pfizer COVID-19 Vaccine 08/13/2019  9:03 AM 0.3 mL 06/20/2018 Intramuscular   Manufacturer: [place]   Lot: [id number]   [place]: [id number]

## 2020-02-04 ENCOUNTER — Encounter: Payer: Self-pay | Admitting: Internal Medicine

## 2020-02-24 DIAGNOSIS — K5730 Diverticulosis of large intestine without perforation or abscess without bleeding: Secondary | ICD-10-CM

## 2020-03-10 LAB — LIPID PANEL
Cholesterol: 115 (ref 0–200)
HDL: 49 (ref 35–70)
LDL Cholesterol: 52
Triglycerides: 67 (ref 40–160)

## 2020-03-10 LAB — TSH: TSH: 0.01 — AB (ref 0.41–5.90)

## 2020-03-24 ENCOUNTER — Encounter: Payer: Self-pay | Admitting: "Endocrinology

## 2020-03-24 ENCOUNTER — Ambulatory Visit (INDEPENDENT_AMBULATORY_CARE_PROVIDER_SITE_OTHER): Payer: Medicare Other | Admitting: "Endocrinology

## 2020-03-24 ENCOUNTER — Other Ambulatory Visit: Payer: Self-pay

## 2020-03-24 VITALS — BP 126/78 | HR 91 | Temp 97.7°F | Resp 16 | Ht 69.0 in

## 2020-03-24 DIAGNOSIS — E0590 Thyrotoxicosis, unspecified without thyrotoxic crisis or storm: Secondary | ICD-10-CM | POA: Diagnosis not present

## 2020-03-24 NOTE — Progress Notes (Signed)
03/24/2020     Endocrinology Consult Note    Subjective:    Patient ID: Mike Davenport, male    DOB: December 31, 1970, PCP [place] Nation, MD.   Past Medical History:  Diagnosis Date  . Carpal tunnel syndrome   . Condylomata acuminata in male   . Coronary artery disease   . Diverticulitis   . GERD (gastroesophageal reflux disease)   . Gout   . Hyperlipidemia   . Hypertension   . Knee pain, bilateral   . Legal blindness   . Lumbar degenerative disc disease   . Myocardial infarction ([place]) 11/2014  . Obesity   . OSA (obstructive sleep apnea)   . Seizures ([place])    from accident; but on no meds and no more seizures since then.  . Tobacco abuse   . Vitamin D deficiency     Past Surgical History:  Procedure Laterality Date  . BIOPSY  09/27/2016   Procedure: BIOPSY;  Surgeon: [name], MD;  [facility];  Service: Endoscopy;;  esophagus and gastric  . COLONOSCOPY N/A 01/24/2017   Procedure: COLONOSCOPY;  Surgeon: [name], MD;  [facility];  Service: Endoscopy;  Laterality: N/A;  1:00pm  . CORONARY ANGIOPLASTY  2016  . ESOPHAGOGASTRODUODENOSCOPY N/A 09/27/2016   Procedure: ESOPHAGOGASTRODUODENOSCOPY (EGD);  Surgeon: [name], MD;  [facility];  Service: Endoscopy;  Laterality: N/A;  300  . 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   Marland Kitchen PARTIAL COLECTOMY N/A 06/27/2017   Procedure: PARTIAL COLECTOMY;  Surgeon: [name], MD;  Location: [facility];  Service: General;  Laterality: N/A;  . SAVORY DILATION N/A 09/27/2016   Procedure: SAVORY DILATION;  Surgeon: [name], MD;  [facility];  Service: Endoscopy;  Laterality: N/A;    Social History   Socioeconomic History  . Marital status: Married    Spouse name: Not on file  . Number of children: Not on file  . Years of education: Not on file  . Highest education level: Not on file   Occupational History  . Occupation: shipping and recieving    Comment: employer: [employer]   Tobacco Use  . Smoking status: Former Smoker    Packs/day: 0.50    Types: Cigarettes    Quit date: 04/28/2014    Years since quitting: 5.9  . Smokeless tobacco: Never Used  . Tobacco comment: decreased smoking  Vaping Use  . Vaping Use: Never used  Substance and Sexual Activity  . Alcohol use: No  . Drug use: No  . Sexual activity: Yes    Birth control/protection: None  Other Topics Concern  . Not on file  Social History Narrative   Pt currently lives with brother and sister in law    Social Determinants of Health   Financial Resource Strain:   . Difficulty of Paying Living Expenses: Not on file  Food Insecurity:   . Worried About Charity fundraiser in the Last Year: Not on file  . Ran Out of Food in the Last Year: Not on file  Transportation Needs:   . Lack of Transportation (Medical): Not on file  . Lack of Transportation (Non-Medical): Not on file  Physical Activity:   . Days of Exercise per Week: Not on file  . Minutes of Exercise per Session: Not on file  Stress:   . Feeling  of Stress : Not on file  Social Connections:   . Frequency of Communication with Friends and Family: Not on file  . Frequency of Social Gatherings with Friends and Family: Not on file  . Attends Religious Services: Not on file  . Active Member of Clubs or Organizations: Not on file  . Attends Archivist Meetings: Not on file  . Marital Status: Not on file    Family History  Problem Relation Age of Onset  . High blood pressure Mother   . Cancer Maternal Grandmother   . Colon cancer Neg Hx     Outpatient Encounter Medications as of 03/24/2020  Medication Sig  . amLODipine (NORVASC) 10 MG tablet TAKE 1 TABLET (10 MG TOTAL) BY MOUTH DAILY.  Marland Kitchen aspirin EC 81 MG tablet Take 81 mg by mouth daily.  Marland Kitchen atorvastatin (LIPITOR) 40 MG tablet Take 40 mg by mouth at bedtime.   . fluticasone (FLONASE) 50  MCG/ACT nasal spray Place into both nostrils.  . furosemide (LASIX) 80 MG tablet Take 80 mg by mouth daily.  . hydrochlorothiazide (HYDRODIURIL) 25 MG tablet TAKE 1 TABLET (25 MG TOTAL) BY MOUTH DAILY. (Patient taking differently: Take 25 mg by mouth every Monday, Wednesday, and Friday. IN THE MORNING)  . isosorbide mononitrate (IMDUR) 60 MG 24 hr tablet TAKE 1 TABLET BY MOUTH EVERY DAY  . KLOR-CON M20 20 MEQ tablet Take 20 mEq by mouth every Monday, Wednesday, and Friday.   . metoprolol (LOPRESSOR) 100 MG tablet TAKE 1 TABLET (100 MG TOTAL) BY MOUTH 2 (TWO) TIMES DAILY.  . nitroGLYCERIN (NITROSTAT) 0.4 MG SL tablet Place 1 tablet (0.4 mg total) under the tongue every 5 (five) minutes as needed for chest pain.  . NON FORMULARY Place 1 each into the nose at bedtime. CPAP  . pantoprazole (PROTONIX) 40 MG tablet TAKE 1 TABLET BY MOUTH EVERY DAY  . [DISCONTINUED] torsemide (DEMADEX) 20 MG tablet TAKE 1 TABLET (20 MG TOTAL) BY MOUTH DAILY AS NEEDED (SWELLING). (Patient not taking: Reported on 03/24/2020)   No facility-administered encounter medications on file as of 03/24/2020.    ALLERGIES: Allergies  Allergen Reactions  . Other Anaphylaxis    [place] nuts    VACCINATION STATUS: Immunization History  Administered Date(s) Administered  . PFIZER SARS-COV-2 Vaccination 07/16/2019, 08/13/2019  . Td 06/25/2003     HPI  Mike Davenport is 49 y.o. male who presents today with a medical history as above. he is being seen in consultation for hyperthyroidism requested by Magna Nation, MD.  he has been dealing with symptoms of weight loss of up to 60 pounds over the last year, tremors, heat intolerance, anxiety, palpitations. These symptoms are progressively worsening and troubling to him. his most recent thyroid labs revealed March 10, 2020 showed significantly suppressed TSH and elevated T3.  He is not on any antithyroid intervention at this time. he denies dysphagia, choking,  shortness of breath, no recent voice change.    he denies family history of thyroid dysfunction nor thyroid malignancy.  - he denies personal history of goiter. he is not on any anti-thyroid medications nor on any thyroid hormone supplements. he  is willing to proceed with appropriate work up and therapy for thyrotoxicosis. His medical history includes legal blindness, coronary artery disease.                          Review of systems  Constitutional: + weight loss, + fatigue, +  subjective hyperthermia Eyes: no blurry vision, - xerophthalmia ENT: no sore throat, no nodules palpated in throat, no dysphagia/odynophagia, nor hoarseness Cardiovascular: no Chest Pain, no Shortness of Breath, +  palpitations, no leg swelling Respiratory: no cough, no SOB Gastrointestinal: no Nausea, no Vomiting, no Diarhhea Musculoskeletal: no muscle/joint aches Skin: no rashes Neurological: ++  tremors, no numbness, no tingling, no dizziness Psychiatric: no depression, ++  anxiety   Objective:    BP 126/78 (BP Location: Left Arm, Patient Position: Sitting, Cuff Size: Normal)   Pulse 91   Temp 97.7 F (36.5 C) (Temporal)   Resp 16   Ht 5\' 9"  (1.753 m)   SpO2 99%   BMI 42.97 kg/m   Wt Readings from Last 3 Encounters:  10/25/17 291 lb (132 kg)  08/09/17 284 lb (128.8 kg)  07/28/17 289 lb (131.1 kg)                                                Physical exam  Constitutional: Body mass index is 42.97 kg/m., not in acute distress, + normal state of mind Eyes: PERRLA, EOMI, - exophthalmos ENT: moist mucous membranes, +  thyromegaly, no cervical lymphadenopathy Cardiovascular: + Normal precordial activity, +tachycardic,  no Murmur/Rubs/Gallops Respiratory:  adequate breathing efforts, no gross chest deformity, Clear to auscultation bilaterally Gastrointestinal: abdomen soft, Non -tender, No distension, Bowel Sounds present Musculoskeletal: no gross deformities, strength intact in all four  extremities Skin: moist, warm, no rashes Neurological: ++  tremor with outstretched hands,  + Deep Tendon Reflexes  on both lower extremities.   CMP     Component Value Date/Time   NA 136 06/29/2017 0555   K 3.6 06/29/2017 0555   CL 101 06/29/2017 0555   CO2 26 06/29/2017 0555   GLUCOSE 77 06/29/2017 0555   BUN 12 06/29/2017 0555   CREATININE 0.98 06/29/2017 0555   CREATININE 1.37 (H) 01/22/2014 1607   CALCIUM 8.7 (L) 06/29/2017 0555   PROT 6.3 08/17/2011 1218   ALBUMIN 3.9 08/17/2011 1218   AST 25 08/17/2011 1218   ALT 22 08/17/2011 1218   ALKPHOS 49 08/17/2011 1218   BILITOT 0.6 08/17/2011 1218   GFRNONAA >60 06/29/2017 0555   GFRAA >60 06/29/2017 0555     CBC    Component Value Date/Time   WBC 10.7 (H) 06/29/2017 0555   RBC 4.62 06/29/2017 0555   HGB 11.3 (L) 06/29/2017 0555   HCT 35.1 (L) 06/29/2017 0555   PLT 180 06/29/2017 0555   MCV 76.0 (L) 06/29/2017 0555   MCH 24.5 (L) 06/29/2017 0555   MCHC 32.2 06/29/2017 0555   RDW 15.4 06/29/2017 0555   LYMPHSABS 3.0 06/22/2017 1437   MONOABS 0.5 06/22/2017 1437   EOSABS 0.1 06/22/2017 1437   BASOSABS 0.0 06/22/2017 1437    Lipid Panel     Component Value Date/Time   CHOL 115 03/10/2020 0000   TRIG 67 03/10/2020 0000   HDL 49 03/10/2020 0000   CHOLHDL 4.1 04/07/2011 1456   VLDL 31 04/07/2011 1456   LDLCALC 52 03/10/2020 0000   LDLDIRECT 131 (H) 08/18/2007 2200     Lab Results  Component Value Date   TSH 0.01 (A) 03/10/2020      Assessment & Plan:   1. Hyperthyroidism  he is being seen at a kind request of Herbst Nation, MD. his history and  most recent labs are reviewed, and he was examined clinically. Subjective and objective findings are consistent with thyrotoxicosis likely from primary hyperthyroidism. The potential risks of untreated thyrotoxicosis and the need for definitive therapy have been discussed in detail with him, and he agrees to proceed with diagnostic workup and treatment plan.    I like to repeat full profile thyroid function tests today and confirmatory thyroid uptake and scan will be scheduled to be done as soon as possible.   Options of therapy are discussed with him.  We discussed the option of treating it with medications including methimazole or PTU which may have side effects including rash, transaminitis, and bone marrow suppression.  We  also discussed the option of definitive therapy with RAI ablation of the thyroid.   If  he is found to have primary hyperthyroidism from Graves' disease , toxic multinodular goiter or toxic nodular goiter the preferred modality of treatment would be I-131 thyroid ablation.    -Patient is made aware of the high likelihood of post ablative hypothyroidism with subsequent need for lifelong thyroid hormone replacement. heunderstands this outcome  and he is  willing to proceed.  Although surgery is one other choice of treatment in some cases, in his case surgery is not a good fit for presentation with only mild goiter.    he will return in 7-10 days for treatment decision.  He is already on beta-blocker-metoprolol 100 mg p.o. daily, advised to continue.   -Patient is advised to maintain close follow up with [place] Nation, MD for primary care needs.   - Time spent with the patient: 60 minutes, of which >50% was spent in obtaining information about his symptoms, reviewing his previous labs, evaluations, and treatments, counseling him about his hyperthyroidism, and developing a plan to confirm the diagnosis and long term treatment as necessary. Please refer to " Patient Self Inventory" in the Media  tab for reviewed elements of pertinent patient history.  Mike Davenport participated in the discussions, expressed understanding, and voiced agreement with the above plans.  All questions were answered to his satisfaction. he is encouraged to contact clinic should he have any questions or concerns prior to his return visit.   Follow  up plan: Return in about 1 week (around 03/31/2020) for Labs Today- Non-Fasting Ok, F/U with Thyroid Uptake and Scan.   Thank you for involving me in the care of this pleasant patient, and I will continue to update you with his progress.  Glade Lloyd, MD [facility] Phone: [phone]  Fax: [phone]   03/24/2020, 12:41 PM  This note was partially dictated with voice recognition software. Similar sounding words can be transcribed inadequately or may not  be corrected upon review.

## 2020-03-25 LAB — T3, FREE: T3, Free: 21.3 pg/mL (ref 2.0–4.4)

## 2020-03-25 LAB — THYROGLOBULIN ANTIBODY: Thyroglobulin Antibody: <1 IU/mL (ref 0.0–0.9)

## 2020-03-25 LAB — T4, FREE: Free T4: 5.27 ng/dL — ABNORMAL HIGH (ref 0.82–1.77)

## 2020-03-25 LAB — TSH: TSH: <0.005 u[IU]/mL — ABNORMAL LOW (ref 0.450–4.500)

## 2020-03-25 LAB — THYROID PEROXIDASE ANTIBODY: Thyroperoxidase Ab SerPl-aCnc: 143 IU/mL — ABNORMAL HIGH (ref 0–34)

## 2020-03-31 ENCOUNTER — Encounter: Payer: Self-pay | Admitting: "Endocrinology

## 2020-03-31 ENCOUNTER — Other Ambulatory Visit: Payer: Self-pay

## 2020-03-31 ENCOUNTER — Ambulatory Visit (INDEPENDENT_AMBULATORY_CARE_PROVIDER_SITE_OTHER): Payer: Medicare Other | Admitting: "Endocrinology

## 2020-03-31 VITALS — BP 120/86 | HR 76 | Ht 69.0 in | Wt 271.5 lb

## 2020-03-31 DIAGNOSIS — E0590 Thyrotoxicosis, unspecified without thyrotoxic crisis or storm: Secondary | ICD-10-CM | POA: Diagnosis not present

## 2020-03-31 DIAGNOSIS — E0500 Thyrotoxicosis with diffuse goiter without thyrotoxic crisis or storm: Secondary | ICD-10-CM | POA: Diagnosis not present

## 2020-03-31 NOTE — Progress Notes (Signed)
03/31/2020      Endocrinology follow-up note    Subjective:    Patient ID: Mike Davenport, male    DOB: November 27, 1970, PCP [place] Nation, MD.   Past Medical History:  Diagnosis Date  . Carpal tunnel syndrome   . Condylomata acuminata in male   . Coronary artery disease   . Diverticulitis   . GERD (gastroesophageal reflux disease)   . Gout   . Hyperlipidemia   . Hypertension   . Knee pain, bilateral   . Legal blindness   . Lumbar degenerative disc disease   . Myocardial infarction ([place]) 11/2014  . Obesity   . OSA (obstructive sleep apnea)   . Seizures ([place])    from accident; but on no meds and no more seizures since then.  . Tobacco abuse   . Vitamin D deficiency     Past Surgical History:  Procedure Laterality Date  . BIOPSY  09/27/2016   Procedure: BIOPSY;  Surgeon: [name], MD;  [facility];  Service: Endoscopy;;  esophagus and gastric  . COLONOSCOPY N/A 01/24/2017   Procedure: COLONOSCOPY;  Surgeon: [name], MD;  [facility];  Service: Endoscopy;  Laterality: N/A;  1:00pm  . CORONARY ANGIOPLASTY  2016  . ESOPHAGOGASTRODUODENOSCOPY N/A 09/27/2016   Procedure: ESOPHAGOGASTRODUODENOSCOPY (EGD);  Surgeon: [name], MD;  [facility];  Service: Endoscopy;  Laterality: N/A;  300  . 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   Marland Kitchen PARTIAL COLECTOMY N/A 06/27/2017   Procedure: PARTIAL COLECTOMY;  Surgeon: [name], MD;  Location: [facility];  Service: General;  Laterality: N/A;  . SAVORY DILATION N/A 09/27/2016   Procedure: SAVORY DILATION;  Surgeon: [name], MD;  [facility];  Service: Endoscopy;  Laterality: N/A;    Social History   Socioeconomic History  . Marital status: Married    Spouse name: Not on file  . Number of children: Not on file  . Years of education: Not on file  . Highest education level: Not on  file  Occupational History  . Occupation: shipping and recieving    Comment: employer: [employer]   Tobacco Use  . Smoking status: Former Smoker    Packs/day: 0.50    Types: Cigarettes    Quit date: 04/28/2014    Years since quitting: 5.9  . Smokeless tobacco: Never Used  . Tobacco comment: decreased smoking  Vaping Use  . Vaping Use: Never used  Substance and Sexual Activity  . Alcohol use: No  . Drug use: No  . Sexual activity: Yes    Birth control/protection: None  Other Topics Concern  . Not on file  Social History Narrative   Pt currently lives with brother and sister in law    Social Determinants of Health   Financial Resource Strain:   . Difficulty of Paying Living Expenses: Not on file  Food Insecurity:   . Worried About Charity fundraiser in the Last Year: Not on file  . Ran Out of Food in the Last Year: Not on file  Transportation Needs:   . Lack of Transportation (Medical): Not on file  . Lack of Transportation (Non-Medical): Not on file  Physical Activity:   . Days of Exercise per Week: Not on file  . Minutes of Exercise per Session: Not on file  Stress:   .  Feeling of Stress : Not on file  Social Connections:   . Frequency of Communication with Friends and Family: Not on file  . Frequency of Social Gatherings with Friends and Family: Not on file  . Attends Religious Services: Not on file  . Active Member of Clubs or Organizations: Not on file  . Attends Archivist Meetings: Not on file  . Marital Status: Not on file    Family History  Problem Relation Age of Onset  . High blood pressure Mother   . Cancer Maternal Grandmother   . Colon cancer Neg Hx     Outpatient Encounter Medications as of 03/31/2020  Medication Sig  . amLODipine (NORVASC) 10 MG tablet TAKE 1 TABLET (10 MG TOTAL) BY MOUTH DAILY.  Marland Kitchen aspirin EC 81 MG tablet Take 81 mg by mouth daily.  Marland Kitchen atenolol (TENORMIN) 25 MG tablet Take 25 mg by mouth daily.  Marland Kitchen atorvastatin (LIPITOR) 40 MG  tablet Take 40 mg by mouth at bedtime.   . fluticasone (FLONASE) 50 MCG/ACT nasal spray Place into both nostrils.  . furosemide (LASIX) 80 MG tablet Take 80 mg by mouth daily.  . hydrochlorothiazide (HYDRODIURIL) 25 MG tablet TAKE 1 TABLET (25 MG TOTAL) BY MOUTH DAILY. (Patient taking differently: Take 25 mg by mouth every Monday, Wednesday, and Friday. IN THE MORNING)  . isosorbide mononitrate (IMDUR) 60 MG 24 hr tablet TAKE 1 TABLET BY MOUTH EVERY DAY  . KLOR-CON M20 20 MEQ tablet Take 20 mEq by mouth every Monday, Wednesday, and Friday.   . metoprolol (LOPRESSOR) 100 MG tablet TAKE 1 TABLET (100 MG TOTAL) BY MOUTH 2 (TWO) TIMES DAILY.  . nitroGLYCERIN (NITROSTAT) 0.4 MG SL tablet Place 1 tablet (0.4 mg total) under the tongue every 5 (five) minutes as needed for chest pain.  . NON FORMULARY Place 1 each into the nose at bedtime. CPAP  . pantoprazole (PROTONIX) 40 MG tablet TAKE 1 TABLET BY MOUTH EVERY DAY   No facility-administered encounter medications on file as of 03/31/2020.    ALLERGIES: Allergies  Allergen Reactions  . Other Anaphylaxis    [place] nuts    VACCINATION STATUS: Immunization History  Administered Date(s) Administered  . PFIZER SARS-COV-2 Vaccination 07/16/2019, 08/13/2019  . Td 06/25/2003     HPI  Mike Davenport is 49 y.o. male who presents today to follow-up with new set of thyroid function tests and thyroid uptake and scan after he was seen in consultation for hyperthyroidism requested by [place] Nation, MD.  he has been dealing with symptoms of weight loss of up to 80 pounds over the last year, tremors, heat intolerance, anxiety, palpitations. These symptoms are progressively worsening and troubling to him. his most recent thyroid labs revealed March 10, 2020 showed significantly suppressed TSH and elevated T3.  His subsequent full profile thyroid function test confirm continued thyroid hormone burden, from primary hyperthyroidism confirmed by  thyroid uptake and scan which was consistent with Graves' disease.   he denies dysphagia, choking, shortness of breath, no recent voice change.    he denies family history of thyroid dysfunction nor thyroid malignancy.  - he denies personal history of goiter. he is not on any anti-thyroid medications nor on any thyroid hormone supplements. he  is willing to proceed with appropriate work up and therapy for thyrotoxicosis. His medical history includes legal blindness, coronary artery disease.  Review of systems  Constitutional: + weight loss, + fatigue, + subjective hyperthermia Eyes: no blurry vision, - xerophthalmia ENT: no sore throat, no nodules palpated in throat, no dysphagia/odynophagia, nor hoarseness Cardiovascular: no Chest Pain, no Shortness of Breath, +  palpitations, no leg swelling Respiratory: no cough, no SOB Gastrointestinal: no Nausea, no Vomiting, no Diarhhea Musculoskeletal: no muscle/joint aches Skin: no rashes Neurological: ++  tremors, no numbness, no tingling, no dizziness Psychiatric: no depression, ++  anxiety   Objective:    BP 120/86   Pulse 76   Ht 5\' 9"  (1.753 m)   Wt 271 lb 8 oz (123.2 kg)   BMI 40.09 kg/m   Wt Readings from Last 3 Encounters:  03/31/20 271 lb 8 oz (123.2 kg)  10/25/17 291 lb (132 kg)  08/09/17 284 lb (128.8 kg)                                                Physical exam  Constitutional: Body mass index is 40.09 kg/m., not in acute distress, + normal state of mind Eyes: PERRLA, EOMI, - exophthalmos ENT: moist mucous membranes, +  thyromegaly, no cervical lymphadenopathy Cardiovascular: + Normal precordial activity, +tachycardic,  no Murmur/Rubs/Gallops Respiratory:  adequate breathing efforts, no gross chest deformity, Clear to auscultation bilaterally Gastrointestinal: abdomen soft, Non -tender, No distension, Bowel Sounds present Musculoskeletal: no gross deformities, strength intact in all four  extremities Skin: moist, warm, no rashes Neurological: ++  tremor with outstretched hands,  + Deep Tendon Reflexes  on both lower extremities.   CMP     Component Value Date/Time   NA 136 06/29/2017 0555   K 3.6 06/29/2017 0555   CL 101 06/29/2017 0555   CO2 26 06/29/2017 0555   GLUCOSE 77 06/29/2017 0555   BUN 12 06/29/2017 0555   CREATININE 0.98 06/29/2017 0555   CREATININE 1.37 (H) 01/22/2014 1607   CALCIUM 8.7 (L) 06/29/2017 0555   PROT 6.3 08/17/2011 1218   ALBUMIN 3.9 08/17/2011 1218   AST 25 08/17/2011 1218   ALT 22 08/17/2011 1218   ALKPHOS 49 08/17/2011 1218   BILITOT 0.6 08/17/2011 1218   GFRNONAA >60 06/29/2017 0555   GFRAA >60 06/29/2017 0555     CBC    Component Value Date/Time   WBC 10.7 (H) 06/29/2017 0555   RBC 4.62 06/29/2017 0555   HGB 11.3 (L) 06/29/2017 0555   HCT 35.1 (L) 06/29/2017 0555   PLT 180 06/29/2017 0555   MCV 76.0 (L) 06/29/2017 0555   MCH 24.5 (L) 06/29/2017 0555   MCHC 32.2 06/29/2017 0555   RDW 15.4 06/29/2017 0555   LYMPHSABS 3.0 06/22/2017 1437   MONOABS 0.5 06/22/2017 1437   EOSABS 0.1 06/22/2017 1437   BASOSABS 0.0 06/22/2017 1437    Lipid Panel     Component Value Date/Time   CHOL 115 03/10/2020 0000   TRIG 67 03/10/2020 0000   HDL 49 03/10/2020 0000   CHOLHDL 4.1 04/07/2011 1456   VLDL 31 04/07/2011 1456   LDLCALC 52 03/10/2020 0000   LDLDIRECT 131 (H) 08/18/2007 2200     Lab Results  Component Value Date   TSH <0.005 (L) 03/24/2020   TSH 0.01 (A) 03/10/2020   FREET4 5.27 (H) 03/24/2020    Thyroid uptake and scan on March 28, 2020: FINDINGS:  Homogeneous diffusely increased tracer uptake throughout both  thyroid lobes.   No focal areas of increased or decreased tracer uptake seen.   4 hour I-123 uptake = 59% (normal 5-20%) , 24 hour I-123 uptake = 68% (normal 10-30%)   Assessment & Plan:   1. Hyperthyroidism 2.  Graves' disease  -His previsit work-up confirms primary hyperthyroidism from Graves'  disease. The potential risks of untreated thyrotoxicosis and the need for definitive therapy have been discussed in detail with him, and he agrees to proceed with diagnostic workup and treatment plan.  Options of therapy are discussed with him.  He agrees with my recommendation of effective/definitive antithyroid intervention with radioactive iodine.    -Patient is made aware of the high likelihood of post ablative hypothyroidism with subsequent need for lifelong thyroid hormone replacement. he  understands this outcome  and he is  willing to proceed.  Although surgery is one other choice of treatment in some cases, in his case surgery is not a good fit for presentation with only mild goiter.    This treatment will be scheduled to be administered at [facility] as soon as possible. He will return in 9 weeks with new set of thyroid function test for reevaluation.  He is already on beta-blocker-metoprolol 100 mg p.o. daily, advised to continue.   -Patient is advised to maintain close follow up with [place] Nation, MD for primary care needs.     - Time spent on this patient care encounter:  20 minutes of which 50% was spent in  counseling and the rest reviewing  his current and  previous labs / studies and medications  doses and developing a plan for long term care. Toma Aran  participated in the discussions, expressed understanding, and voiced agreement with the above plans.  All questions were answered to his satisfaction. he is encouraged to contact clinic should he have any questions or concerns prior to his return visit.   Follow up plan: Return in about 9 weeks (around 06/02/2020) for F/U with Labs after I131 Therapy.   Thank you for involving me in the care of this pleasant patient, and I will continue to update you with his progress.  Glade Lloyd, MD [facility] [place] [facility] Phone: [phone]  Fax: [phone]   03/31/2020, 12:56  PM  This note was partially dictated with voice recognition software. Similar sounding words can be transcribed inadequately or may not  be corrected upon review.

## 2020-04-24 ENCOUNTER — Ambulatory Visit: Payer: Medicare Other | Admitting: Urology

## 2020-05-08 ENCOUNTER — Telehealth: Payer: Self-pay

## 2020-05-08 NOTE — Telephone Encounter (Signed)
I spoke with patient and advised of appt info. Verbalized understanding

## 2020-05-08 NOTE — Telephone Encounter (Signed)
[facility] left a VM that they have scheduled the patient for his Nuc Med RAI Therapy for 05/16/20 at 9 am, patient needs to arrive at 8:30am. They were unable to reach patient and did not leave this information on his voicemail. He has to be NPO after midnight and needs to be on a low iodine diet, They can be reached at [phone] opt [phone]

## 2020-05-13 NOTE — Telephone Encounter (Signed)
Simona Huh called from [facility] in regards to this pt and would like a call back [phone]

## 2020-05-13 NOTE — Telephone Encounter (Signed)
Returned call and spoke with Mike Davenport, she wanted to verify that the patient was aware of appt, Patient is aware

## 2020-05-31 LAB — T4, FREE: Free T4: 3.07 ng/dL — ABNORMAL HIGH (ref 0.82–1.77)

## 2020-05-31 LAB — TSH: TSH: <0.005 u[IU]/mL — ABNORMAL LOW (ref 0.450–4.500)

## 2020-06-03 ENCOUNTER — Encounter: Payer: Self-pay | Admitting: "Endocrinology

## 2020-06-03 ENCOUNTER — Other Ambulatory Visit: Payer: Self-pay

## 2020-06-03 ENCOUNTER — Ambulatory Visit (INDEPENDENT_AMBULATORY_CARE_PROVIDER_SITE_OTHER): Payer: BC Managed Care – PPO | Admitting: "Endocrinology

## 2020-06-03 VITALS — BP 124/78 | HR 88 | Ht 69.0 in | Wt 288.0 lb

## 2020-06-03 DIAGNOSIS — E0500 Thyrotoxicosis with diffuse goiter without thyrotoxic crisis or storm: Secondary | ICD-10-CM

## 2020-06-03 DIAGNOSIS — E0590 Thyrotoxicosis, unspecified without thyrotoxic crisis or storm: Secondary | ICD-10-CM | POA: Diagnosis not present

## 2020-06-03 NOTE — Progress Notes (Signed)
06/03/2020     Endocrinology follow-up note    Subjective:    Patient ID: Mike Davenport, male    DOB: [birth year], PCP [place] Nation, MD.   Past Medical History:  Diagnosis Date  . Carpal tunnel syndrome   . Condylomata acuminata in male   . Coronary artery disease   . Diverticulitis   . GERD (gastroesophageal reflux disease)   . Gout   . Hyperlipidemia   . Hypertension   . Knee pain, bilateral   . Legal blindness   . Lumbar degenerative disc disease   . Myocardial infarction ([place]) 11/2014  . Obesity   . OSA (obstructive sleep apnea)   . Seizures ([place])    from accident; but on no meds and no more seizures since then.  . Tobacco abuse   . Vitamin D deficiency     Past Surgical History:  Procedure Laterality Date  . BIOPSY  09/27/2016   Procedure: BIOPSY;  Surgeon: [name], MD;  [facility];  Service: Endoscopy;;  esophagus and gastric  . COLONOSCOPY N/A 01/24/2017   Procedure: COLONOSCOPY;  Surgeon: [name], MD;  [facility];  Service: Endoscopy;  Laterality: N/A;  1:00pm  . CORONARY ANGIOPLASTY  2016  . ESOPHAGOGASTRODUODENOSCOPY N/A 09/27/2016   Procedure: ESOPHAGOGASTRODUODENOSCOPY (EGD);  Surgeon: [name], MD;  [facility];  Service: Endoscopy;  Laterality: N/A;  300  . 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   Marland Kitchen PARTIAL COLECTOMY N/A 06/27/2017   Procedure: PARTIAL COLECTOMY;  Surgeon: [name], MD;  Location: [facility];  Service: General;  Laterality: N/A;  . SAVORY DILATION N/A 09/27/2016   Procedure: SAVORY DILATION;  Surgeon: [name], MD;  [facility];  Service: Endoscopy;  Laterality: N/A;    Social History   Socioeconomic History  . Marital status: Married    Spouse name: Not on file  . Number of children: Not on file  . Years of education: Not on file  . Highest education level: Not on file   Occupational History  . Occupation: shipping and recieving    Comment: employer: [employer]   Tobacco Use  . Smoking status: Former Smoker    Packs/day: 0.50    Types: Cigarettes    Quit date: 04/28/2014    Years since quitting: 6.1  . Smokeless tobacco: Never Used  . Tobacco comment: decreased smoking  Vaping Use  . Vaping Use: Never used  Substance and Sexual Activity  . Alcohol use: No  . Drug use: No  . Sexual activity: Yes    Birth control/protection: None  Other Topics Concern  . Not on file  Social History Narrative   Pt currently lives with brother and sister in law    Social Determinants of Health   Financial Resource Strain: Not on file  Food Insecurity: Not on file  Transportation Needs: Not on file  Physical Activity: Not on file  Stress: Not on file  Social Connections: Not on file    Family History  Problem Relation Age of Onset  . High blood pressure Mother   . Cancer Maternal Grandmother   . Colon cancer Neg Hx     Outpatient Encounter Medications as of 06/03/2020  Medication Sig  . amLODipine (NORVASC) 10 MG tablet TAKE 1 TABLET (10 MG TOTAL) BY MOUTH DAILY.  Marland Kitchen aspirin EC  81 MG tablet Take 81 mg by mouth daily.  Marland Kitchen atenolol (TENORMIN) 25 MG tablet Take 25 mg by mouth daily.  Marland Kitchen atorvastatin (LIPITOR) 40 MG tablet Take 40 mg by mouth at bedtime.   . fluticasone (FLONASE) 50 MCG/ACT nasal spray Place into both nostrils.  . furosemide (LASIX) 80 MG tablet Take 80 mg by mouth daily.  . hydrochlorothiazide (HYDRODIURIL) 25 MG tablet TAKE 1 TABLET (25 MG TOTAL) BY MOUTH DAILY. (Patient taking differently: Take 25 mg by mouth every Monday, Wednesday, and Friday. IN THE MORNING)  . isosorbide mononitrate (IMDUR) 60 MG 24 hr tablet TAKE 1 TABLET BY MOUTH EVERY DAY  . KLOR-CON M20 20 MEQ tablet Take 20 mEq by mouth every Monday, Wednesday, and Friday.   . metoprolol (LOPRESSOR) 100 MG tablet TAKE 1 TABLET (100 MG TOTAL) BY MOUTH 2 (TWO) TIMES DAILY.  . nitroGLYCERIN  (NITROSTAT) 0.4 MG SL tablet Place 1 tablet (0.4 mg total) under the tongue every 5 (five) minutes as needed for chest pain.  . NON FORMULARY Place 1 each into the nose at bedtime. CPAP  . pantoprazole (PROTONIX) 40 MG tablet TAKE 1 TABLET BY MOUTH EVERY DAY   No facility-administered encounter medications on file as of 06/03/2020.    ALLERGIES: Allergies  Allergen Reactions  . Other Anaphylaxis    [place] nuts    VACCINATION STATUS: Immunization History  Administered Date(s) Administered  . PFIZER([place])SARS-COV-2 Vaccination 07/16/2019, 08/13/2019  . Td 06/25/2003     HPI  Mike Davenport is 50 y.o. male who presents today to follow-up after he was seen in consultation for hyperthyroidism due to Graves' disease. Referred by:[place] Nation, MD.  -He has received I-131 thyroid ablation on May 16, 2020.  He is reporting improvement in his prior symptoms.  He lost about 80 pounds prior to his last visit.  He has gained 17 pounds since last visit.  His previous symptoms of tremors, heat intolerance, anxiety, palpitations largely improved.  His previsit labs show treatment effect, however not in the hypothyroid range.  he denies dysphagia, choking, shortness of breath, no recent voice change.    he denies family history of thyroid dysfunction nor thyroid malignancy.  - he denies personal history of goiter. he is not on any anti-thyroid medications nor on any thyroid hormone supplements. he  is willing to proceed with appropriate work up and therapy for thyrotoxicosis. His medical history includes legal blindness, coronary artery disease.                          Review of systems  Limited as above.   Objective:    BP 124/78   Pulse 88   Ht 5\' 9"  (1.753 m)   Wt 288 lb (130.6 kg)   BMI 42.53 kg/m   Wt Readings from Last 3 Encounters:  06/03/20 288 lb (130.6 kg)  03/31/20 271 lb 8 oz (123.2 kg)  10/25/17 291 lb (132 kg)                                                 Physical exam  Constitutional: Body mass index is 42.53 kg/m., not in acute distress, + normal state of mind    CMP     Component Value Date/Time   NA 136 06/29/2017 0555   K 3.6  06/29/2017 0555   CL 101 06/29/2017 0555   CO2 26 06/29/2017 0555   GLUCOSE 77 06/29/2017 0555   BUN 12 06/29/2017 0555   CREATININE 0.98 06/29/2017 0555   CREATININE 1.37 (H) 01/22/2014 1607   CALCIUM 8.7 (L) 06/29/2017 0555   PROT 6.3 08/17/2011 1218   ALBUMIN 3.9 08/17/2011 1218   AST 25 08/17/2011 1218   ALT 22 08/17/2011 1218   ALKPHOS 49 08/17/2011 1218   BILITOT 0.6 08/17/2011 1218   GFRNONAA >60 06/29/2017 0555   GFRAA >60 06/29/2017 0555     CBC    Component Value Date/Time   WBC 10.7 (H) 06/29/2017 0555   RBC 4.62 06/29/2017 0555   HGB 11.3 (L) 06/29/2017 0555   HCT 35.1 (L) 06/29/2017 0555   PLT 180 06/29/2017 0555   MCV 76.0 (L) 06/29/2017 0555   MCH 24.5 (L) 06/29/2017 0555   MCHC 32.2 06/29/2017 0555   RDW 15.4 06/29/2017 0555   LYMPHSABS 3.0 06/22/2017 1437   MONOABS 0.5 06/22/2017 1437   EOSABS 0.1 06/22/2017 1437   BASOSABS 0.0 06/22/2017 1437    Lipid Panel     Component Value Date/Time   CHOL 115 03/10/2020 0000   TRIG 67 03/10/2020 0000   HDL 49 03/10/2020 0000   CHOLHDL 4.1 04/07/2011 1456   VLDL 31 04/07/2011 1456   LDLCALC 52 03/10/2020 0000   LDLDIRECT 131 (H) 08/18/2007 2200     Lab Results  Component Value Date   TSH <0.005 (L) 05/30/2020   TSH <0.005 (L) 03/24/2020   TSH 0.01 (A) 03/10/2020   FREET4 3.07 (H) 05/30/2020   FREET4 5.27 (H) 03/24/2020    Thyroid uptake and scan on March 28, 2020: FINDINGS:  Homogeneous diffusely increased tracer uptake throughout both  thyroid lobes.   No focal areas of increased or decreased tracer uptake seen.   4 hour I-123 uptake = 59% (normal 5-20%) , 24 hour I-123 uptake = 68% (normal 10-30%)   April 26, 2020 RAI thyroid ablation: 15.2 mCi.  Assessment & Plan:   1. Hyperthyroidism 2.   Graves' disease -He already received his ablative treatment with I-131 on May 16, 2020.  His previsit labs are indicative of treatment effect, however he is not in the hypothyroid range.  He is not ready for thyroid hormone replacement.  -Patient is made aware of the high likelihood of post ablative hypothyroidism with subsequent need for lifelong thyroid hormone replacement. he  understands this outcome and is willing to return with another set of thyroid function test in 6 to 8 weeks.   He is already on beta-blocker-metoprolol 100 mg p.o. daily, advised to continue.  He is pulse rate is 88 during this visit.  -Patient is advised to maintain close follow up with [place] Nation, MD for primary care needs.      - Time spent on this patient care encounter:  20 minutes of which 50% was spent in  counseling and the rest reviewing  his current and  previous labs / studies and medications  doses and developing a plan for long term care. Toma Aran  participated in the discussions, expressed understanding, and voiced agreement with the above plans.  All questions were answered to his satisfaction. he is encouraged to contact clinic should he have any questions or concerns prior to his return visit.   Follow up plan: Return in about 7 weeks (around 07/22/2020) for NV with Anum Palecek, F/U with Pre-visit Labs.   Thank you for  involving me in the care of this pleasant patient, and I will continue to update you with his progress.  Glade Lloyd, MD [facility] [place] [facility] Phone: [phone]  Fax: [phone]   06/03/2020, 1:12 PM  This note was partially dictated with voice recognition software. Similar sounding words can be transcribed inadequately or may not  be corrected upon review.

## 2020-06-09 ENCOUNTER — Encounter: Payer: Self-pay | Admitting: Urology

## 2020-06-09 ENCOUNTER — Other Ambulatory Visit: Payer: Self-pay

## 2020-06-09 ENCOUNTER — Ambulatory Visit (INDEPENDENT_AMBULATORY_CARE_PROVIDER_SITE_OTHER): Payer: BC Managed Care – PPO | Admitting: Urology

## 2020-06-09 VITALS — BP 112/64 | HR 87 | Temp 98.3°F | Ht 69.0 in | Wt 288.0 lb

## 2020-06-09 DIAGNOSIS — R972 Elevated prostate specific antigen [PSA]: Secondary | ICD-10-CM | POA: Diagnosis not present

## 2020-06-09 LAB — URINALYSIS, ROUTINE W REFLEX MICROSCOPIC
Bilirubin, UA: NEGATIVE
Glucose, UA: NEGATIVE
Leukocytes,UA: NEGATIVE
Nitrite, UA: NEGATIVE
RBC, UA: NEGATIVE
Specific Gravity, UA: 1.025 (ref 1.005–1.030)
Urobilinogen, Ur: 1 mg/dL (ref 0.2–1.0)
pH, UA: 6 (ref 5.0–7.5)

## 2020-06-09 LAB — MICROSCOPIC EXAMINATION
Bacteria, UA: NONE SEEN
Epithelial Cells (non renal): NONE SEEN /hpf (ref 0–10)
RBC, Urine: NONE SEEN /hpf (ref 0–2)
Renal Epithel, UA: NONE SEEN /hpf
WBC, UA: NONE SEEN /hpf (ref 0–5)

## 2020-06-09 LAB — BLADDER SCAN AMB NON-IMAGING: Scan Result: 30

## 2020-06-09 NOTE — Progress Notes (Signed)
06/09/2020 1:51 PM   Mike Davenport 1971-02-28 [id number]  Referring provider: Scotty Court, DO [address],   [id number]  Elevated PSA  HPI: Mike Davenport is a 50yo here for evaluation of elevated PSA. PSA was 5-6 per patient and was treated with antibiotics for 1 week and PSA decreased to 1.9. He has nocturia 3-4x but he drinks 200oz of water daily. NO hesitancy, feeling of incomplete emptying. No dysuria or hematuria. No family hx of prostate cancer   PMH: Past Medical History:  Diagnosis Date  . Carpal tunnel syndrome   . Condylomata acuminata in male   . Coronary artery disease   . Diverticulitis   . GERD (gastroesophageal reflux disease)   . Gout   . Heart attack ([place])   . Hyperlipidemia   . Hypertension   . Knee pain, bilateral   . Legal blindness   . Lumbar degenerative disc disease   . Myocardial infarction ([place]) 11/2014  . Obesity   . OSA (obstructive sleep apnea)   . Seizures ([place])    from accident; but on no meds and no more seizures since then.  . Thyroid disease   . Tobacco abuse   . Vitamin D deficiency     Surgical History: Past Surgical History:  Procedure Laterality Date  . BIOPSY  09/27/2016   Procedure: BIOPSY;  Surgeon: [name], MD;  [facility];  Service: Endoscopy;;  esophagus and gastric  . COLONOSCOPY N/A 01/24/2017   Procedure: COLONOSCOPY;  Surgeon: [name], MD;  [facility]: [facility];  Service: Endoscopy;  Laterality: N/A;  1:00pm  . CORONARY ANGIOPLASTY  2016  . ESOPHAGOGASTRODUODENOSCOPY N/A 09/27/2016   Procedure: ESOPHAGOGASTRODUODENOSCOPY (EGD);  Surgeon: [name], MD;  [facility]: [facility];  Service: Endoscopy;  Laterality: N/A;  300  . 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   Marland Kitchen PARTIAL COLECTOMY N/A 06/27/2017   Procedure: PARTIAL COLECTOMY;  Surgeon: [name], MD;  Location: [facility];  Service:  General;  Laterality: N/A;  . SAVORY DILATION N/A 09/27/2016   Procedure: SAVORY DILATION;  Surgeon: [name], MD;  [facility];  Service: Endoscopy;  Laterality: N/A;    Home Medications:  Allergies as of 06/09/2020      Reactions   Other Anaphylaxis   [place] nuts   Justicia Adhatoda ([place] Nut Tree) [justicia Adhatoda]       Medication List       Accurate as of June 09, 2020  1:51 PM. If you have any questions, ask your nurse or doctor.        amLODipine 10 MG tablet Commonly known as: NORVASC TAKE 1 TABLET (10 MG TOTAL) BY MOUTH DAILY.   aspirin EC 81 MG tablet Take 81 mg by mouth daily.   atenolol 25 MG tablet Commonly known as: TENORMIN Take 25 mg by mouth daily.   atorvastatin 40 MG tablet Commonly known as: LIPITOR Take 40 mg by mouth at bedtime.   fluticasone 50 MCG/ACT nasal spray Commonly known as: FLONASE Place into both nostrils.   furosemide 80 MG tablet Commonly known as: LASIX Take 80 mg by mouth daily.   hydrochlorothiazide 25 MG tablet Commonly known as: HYDRODIURIL TAKE 1 TABLET (25 MG TOTAL) BY MOUTH DAILY. What changed:   how much to take  how to take this  when to take  this  additional instructions   isosorbide mononitrate 60 MG 24 hr tablet Commonly known as: IMDUR TAKE 1 TABLET BY MOUTH EVERY DAY   Klor-Con M20 20 MEQ tablet Generic drug: potassium chloride SA Take 20 mEq by mouth every Monday, Wednesday, and Friday.   metoprolol tartrate 100 MG tablet Commonly known as: LOPRESSOR TAKE 1 TABLET (100 MG TOTAL) BY MOUTH 2 (TWO) TIMES DAILY.   nitroGLYCERIN 0.4 MG SL tablet Commonly known as: NITROSTAT Place 1 tablet (0.4 mg total) under the tongue every 5 (five) minutes as needed for chest pain.   NON FORMULARY Place 1 each into the nose at bedtime. CPAP   pantoprazole 40 MG tablet Commonly known as: PROTONIX TAKE 1 TABLET BY MOUTH EVERY DAY       Allergies:  Allergies  Allergen Reactions   . Other Anaphylaxis    [place] nuts  . Justicia Adhatoda ([place] Nut Tree) [Justicia Adhatoda]     Family History: Family History  Problem Relation Age of Onset  . High blood pressure Mother   . Cancer Maternal Grandmother   . Colon cancer Neg Hx     Social History:  reports that he quit smoking about 6 years ago. His smoking use included cigarettes. He smoked 0.50 packs per day. He has never used smokeless tobacco. He reports that he does not drink alcohol and does not use drugs.  ROS: All other review of systems were reviewed and are negative except what is noted above in HPI  Physical Exam: BP 112/64   Pulse 87   Temp 98.3 F (36.8 C)   Ht 5\' 9"  (1.753 m)   Wt 288 lb (130.6 kg)   BMI 42.53 kg/m   Constitutional:  Alert and oriented, No acute distress. HEENT: [place] AT, moist mucus membranes.  Trachea midline, no masses. Cardiovascular: No clubbing, cyanosis, or edema. Respiratory: Normal respiratory effort, no increased work of breathing. GI: Abdomen is soft, nontender, nondistended, no abdominal masses GU: No CVA tenderness. Circumcised phallus. No masses/lesions on penis, testis, scrotum. Prostate 30g smooth no nodules no induration.  Lymph: No cervical or inguinal lymphadenopathy. Skin: No rashes, bruises or suspicious lesions. Neurologic: Grossly intact, no focal deficits, moving all 4 extremities. Psychiatric: Normal mood and affect.  Laboratory Data: Lab Results  Component Value Date   WBC 10.7 (H) 06/29/2017   HGB 11.3 (L) 06/29/2017   HCT 35.1 (L) 06/29/2017   MCV 76.0 (L) 06/29/2017   PLT 180 06/29/2017    Lab Results  Component Value Date   CREATININE 0.98 06/29/2017    No results found for: PSA  No results found for: TESTOSTERONE  No results found for: HGBA1C  Urinalysis    Component Value Date/Time   BILIRUBINUR NEG 02/27/2014 1624   PROTEINUR NEG 02/27/2014 1624   UROBILINOGEN 1.0 02/27/2014 1624   NITRITE NEG 02/27/2014 1624    LEUKOCYTESUR small (1+) 02/27/2014 1624    Lab Results  Component Value Date   BACTERIA 2+ 02/27/2014    Pertinent Imaging:  No results found for this or any previous visit.  No results found for this or any previous visit.  No results found for this or any previous visit.  No results found for this or any previous visit.  No results found for this or any previous visit.  No results found for this or any previous visit.  No results found for this or any previous visit.  No results found for this or any previous visit.   Assessment &  Plan:    1. Elevated PSA The patient and I talked about etiologies of elevated PSA.  We discussed the possible relationship between elevated PSA and prostate cancer, BPH, prostatitis, infection trauma and recent ejaculations.  The patient's PSA has been relatively stable over the interval and as such I recommended that we follow-up with a repeat PSA in 6 months.  If it remains elevated with a positive rising trend we will discuss prostate biopsy at his follow-up appointment. - Urinalysis, Routine w reflex microscopic - BLADDER SCAN AMB NON-IMAGING   No follow-ups on file.  Nicolette Bang, MD  [facility] [place]

## 2020-06-09 NOTE — Progress Notes (Signed)
Bladder Scan Patient can void: 30 ml Performed By: Letti Towell,lpn   Urological Symptom Review  Patient is experiencing the following symptoms: Get up at night to urinate Stream starts and stops Weak stream   Review of Systems  Gastrointestinal (upper)  : Indigestion/heartburn  Gastrointestinal (lower) : Negative for lower GI symptoms  Constitutional : Negative for symptoms  Skin: Negative for skin symptoms  Eyes: blind  Ear/Nose/Throat : Sinus problems  Hematologic/Lymphatic: Negative for Hematologic/Lymphatic symptoms  Cardiovascular : Leg swelling  Respiratory : Cough  Endocrine: Negative for endocrine symptoms  Musculoskeletal: Joint pain  Neurological: Negative for neurological symptoms  Psychologic: Negative for psychiatric symptoms

## 2020-06-09 NOTE — Patient Instructions (Signed)
  Prostate-Specific Antigen Test Why am I having this test? The prostate-specific antigen (PSA) test is a screening test for prostate cancer. It can identify early signs of prostate cancer, which may allow for more effective treatment. Your health care provider may recommend that you have a PSA test starting at age 49 or that you have one earlier or later, depending on your risk factors for prostate cancer. You may also have a PSA test:  To monitor treatment of prostate cancer.  To check whether prostate cancer has returned after treatment.  If you have signs of other conditions that can affect PSA levels, such as: ? An enlarged prostate that is not caused by cancer (benign prostatic hyperplasia, BPH). This condition is very common in older men. ? A prostate infection. What is being tested? This test measures the amount of PSA in your blood. PSA is a protein that is made in the prostate. The prostate naturally produces more PSA as you age, but very high levels may be a sign of a medical condition. What kind of sample is taken? A blood sample is required for this test. It is usually collected by inserting a needle into a blood vessel or by sticking a finger with a small needle. Blood for this test should be drawn before having an exam of the prostate.   How do I prepare for this test? Do not ejaculate starting 24 hours before your test, or as long as told by your health care provider. Tell a health care provider about:  Any allergies you have.  All medicines you are taking, including vitamins, herbs, eye drops, creams, and over-the-counter medicines. This also includes: ? Medicines to assist with hair growth, such as finasteride. ? Any recent exposure to a medicine called diethylstilbestrol.  Any blood disorders you have.  Any recent procedures you have had, especially any procedures involving the prostate or rectum.  Any medical conditions you have.  Any recent urinary tract  infections (UTIs) you have had. How are the results reported? Your test results will be reported as a value that indicates how much PSA is in your blood. This will be given as nanograms of PSA per milliliter of blood (ng/mL). Your health care provider will compare your results to normal ranges that were established after testing a large group of people (reference ranges). Reference ranges may vary among labs and hospitals. PSA levels vary from person to person and generally increase with age. Because of this variation, there is no single PSA value that is considered normal for everyone. Instead, PSA reference ranges are used to describe whether your PSA levels are considered low or high (elevated). Common reference ranges are:  Low: 0-2.5 ng/mL.  Slightly to moderately elevated: 2.6-10.0 ng/mL.  Moderately elevated: 10.0-19.9 ng/mL.  Significantly elevated: 20 ng/mL or greater. Sometimes, the test results may report that a condition is present when it is not present (false-positive result). What do the results mean? A test result that is higher than 4 ng/mL may mean that you are at an increased risk for prostate cancer. However, a PSA test by itself is not enough to diagnose prostate cancer. High PSA levels may also be caused by the natural aging process, prostate infection, or BPH. PSA screening cannot tell you if your PSA is high due to cancer or a different cause. A prostate biopsy is the only way to diagnose prostate cancer. A risk of having the PSA test is diagnosing and treating prostate cancer that would never   have caused any symptoms or problems (overdiagnosis and overtreatment). Talk with your health care provider about what your results mean. Questions to ask your health care provider Ask your health care provider, or the department that is doing the test:  When will my results be ready?  How will I get my results?  What are my treatment options?  What other tests do I  need?  What are my next steps? Summary  The prostate-specific antigen (PSA) test is a screening test for prostate cancer.  Your health care provider may recommend that you have a PSA test starting at age 49 or that you have one earlier or later, depending on your risk factors for prostate cancer.  A test result that is higher than 4 ng/mL may mean that you are at an increased risk for prostate cancer. However, elevated levels can be caused by a number of conditions other than prostate cancer.  Talk with your health care provider about what your results mean. This information is not intended to replace advice given to you by your health care provider. Make sure you discuss any questions you have with your health care provider. Document Revised: 12/27/2019 Document Reviewed: 12/27/2019 Elsevier Patient Education  2021 Elsevier Inc.  

## 2020-06-10 ENCOUNTER — Telehealth: Payer: Self-pay

## 2020-06-10 NOTE — Telephone Encounter (Signed)
Pt notified per Dr. Alyson Ingles PSA came back normal.

## 2020-06-10 NOTE — Telephone Encounter (Signed)
-----   Message from Cleon Gustin, MD sent at 06/10/2020  9:42 AM EST ----- normal ----- Message ----- From: Valentina Lucks, LPN Sent: 09/11/3435   4:10 PM EST To: Cleon Gustin, MD  Pls review.

## 2020-06-28 LAB — TSH: TSH: 0.01 — AB (ref 0.41–5.90)

## 2020-07-02 ENCOUNTER — Emergency Department (HOSPITAL_COMMUNITY): Payer: Worker's Compensation

## 2020-07-02 ENCOUNTER — Other Ambulatory Visit: Payer: Self-pay

## 2020-07-02 ENCOUNTER — Emergency Department (HOSPITAL_COMMUNITY)
Admission: EM | Admit: 2020-07-02 | Discharge: 2020-07-02 | Disposition: A | Discharge status: Home / Self Care | Payer: Worker's Compensation | Attending: Emergency Medicine | Admitting: Emergency Medicine

## 2020-07-02 ENCOUNTER — Telehealth (HOSPITAL_COMMUNITY): Payer: Self-pay | Admitting: Emergency Medicine

## 2020-07-02 DIAGNOSIS — W19XXXA Unspecified fall, initial encounter: Secondary | ICD-10-CM

## 2020-07-02 DIAGNOSIS — Y9301 Activity, walking, marching and hiking: Secondary | ICD-10-CM | POA: Diagnosis not present

## 2020-07-02 DIAGNOSIS — S0031XA Abrasion of nose, initial encounter: Secondary | ICD-10-CM

## 2020-07-02 DIAGNOSIS — S61312A Laceration without foreign body of right middle finger with damage to nail, initial encounter: Secondary | ICD-10-CM | POA: Diagnosis not present

## 2020-07-02 DIAGNOSIS — Z23 Encounter for immunization: Secondary | ICD-10-CM | POA: Diagnosis not present

## 2020-07-02 DIAGNOSIS — Z79899 Other long term (current) drug therapy: Secondary | ICD-10-CM | POA: Insufficient documentation

## 2020-07-02 DIAGNOSIS — S0992XA Unspecified injury of nose, initial encounter: Secondary | ICD-10-CM | POA: Diagnosis present

## 2020-07-02 DIAGNOSIS — I2510 Atherosclerotic heart disease of native coronary artery without angina pectoris: Secondary | ICD-10-CM | POA: Insufficient documentation

## 2020-07-02 DIAGNOSIS — I10 Essential (primary) hypertension: Secondary | ICD-10-CM | POA: Insufficient documentation

## 2020-07-02 DIAGNOSIS — W01198A Fall on same level from slipping, tripping and stumbling with subsequent striking against other object, initial encounter: Secondary | ICD-10-CM | POA: Insufficient documentation

## 2020-07-02 DIAGNOSIS — S43402A Unspecified sprain of left shoulder joint, initial encounter: Secondary | ICD-10-CM | POA: Insufficient documentation

## 2020-07-02 DIAGNOSIS — S0121XA Laceration without foreign body of nose, initial encounter: Secondary | ICD-10-CM | POA: Diagnosis not present

## 2020-07-02 DIAGNOSIS — Z7982 Long term (current) use of aspirin: Secondary | ICD-10-CM | POA: Diagnosis not present

## 2020-07-02 DIAGNOSIS — Z87891 Personal history of nicotine dependence: Secondary | ICD-10-CM | POA: Insufficient documentation

## 2020-07-02 MED ORDER — HYDROCODONE-ACETAMINOPHEN 5-325 MG PO TABS: 1.0000 | ORAL_TABLET | Freq: Four times a day (QID) | ORAL | 0 refills | Status: DC | PRN

## 2020-07-02 MED ORDER — LIDOCAINE 5 % EX PTCH: 1.0000 | MEDICATED_PATCH | CUTANEOUS | 0 refills | Status: DC

## 2020-07-02 MED ORDER — TETANUS-DIPHTH-ACELL PERTUSSIS 5-2.5-18.5 LF-MCG/0.5 IM SUSY
0.5000 mL | PREFILLED_SYRINGE | Freq: Once | INTRAMUSCULAR | Status: AC
  Administered 2020-07-02: 0.5 mL via INTRAMUSCULAR
  Filled 2020-07-02: qty 0.5

## 2020-07-02 MED ORDER — HYDROCODONE-ACETAMINOPHEN 5-325 MG PO TABS
1.0000 | ORAL_TABLET | Freq: Once | ORAL | Status: AC
  Administered 2020-07-02: 1 via ORAL
  Filled 2020-07-02: qty 1

## 2020-07-02 NOTE — Telephone Encounter (Signed)
The pharmacy does not have medications and they needed to be sent to [facility]

## 2020-07-02 NOTE — Discharge Instructions (Signed)
The skin glue should be present on the nose and finger for approximately about a week.  When it is ready it will peel off.  Do not scrub the area but it is okay to take a shower and it can get wet.  Make sure you are ranging your left shoulder regularly so it does not get stiff.  You can use the pain medication as needed but can always do Tylenol and the lidocaine patches as well.  Avoid any heavy lifting.  If it is still hurting in 1 week you should follow-up with a specialist.

## 2020-07-02 NOTE — ED Notes (Signed)
Pt provided urinal. Pt sit to stand on side of the bed for bathroom needs

## 2020-07-02 NOTE — ED Triage Notes (Signed)
Pt came from work via EMS. PT is legally blind; fully blind in left eye, partially blind in right eye. Pt tripped over a pallette, landed on face and left shoulder. Nose was bleeding with fall initally, not bleeding when EMS arrived. 172/90 95 bpm 98% on RA

## 2020-07-02 NOTE — ED Provider Notes (Signed)
[facility] DEPT Provider Note   CSN: [id number] Arrival date & time: 07/02/20  0741     History Chief Complaint  Patient presents with  . Fall    Mike Davenport is a 50 y.o. male.  The history is provided by the patient.  Fall This is a new problem. The current episode started 1 to 2 hours ago. The problem occurs constantly. The problem has not changed since onset.Associated symptoms comments: Left shoulder pain, right middle finger pain and pain to the left nare.  No head injury or LOC.  No anticoagulants.  Patient was walking into work and he is legally blind and did not see a palate and tripped over it falling forward hitting his face on something and landing on his left shoulder.  No back or leg pain.  He denies any neck pain.. The symptoms are aggravated by bending (Movement of the shoulder makes the pain much more severe). The symptoms are relieved by rest. He has tried rest for the symptoms. The treatment provided no relief.       Past Medical History:  Diagnosis Date  . Carpal tunnel syndrome   . Condylomata acuminata in male   . Coronary artery disease   . Diverticulitis   . GERD (gastroesophageal reflux disease)   . Gout   . Heart attack ([place])   . Hyperlipidemia   . Hypertension   . Knee pain, bilateral   . Legal blindness   . Lumbar degenerative disc disease   . Myocardial infarction ([place]) 11/2014  . Obesity   . OSA (obstructive sleep apnea)   . Seizures ([place])    from accident; but on no meds and no more seizures since then.  . Thyroid disease   . Tobacco abuse   . Vitamin D deficiency     Patient Active Problem List   Diagnosis Date Noted  . Hyperthyroidism 03/31/2020  . Graves disease 03/31/2020  . Sigmoid diverticulosis 02/24/2020  . S/P partial colectomy 06/27/2017  . Diverticulitis of colon 02/15/2017  . Multiple polyps of sigmoid colon   . Polyp of colon   . Rectal pain 12/28/2016  . Abnormal CT scan, colon  12/28/2016  . Multiple gastric polyps   . Dysphagia 09/16/2016  . Trigger finger of right thumb 01/27/2016  . Bilateral hand pain 11/18/2015  . Radial styloid tenosynovitis 11/18/2015  . Coronary artery disease 02/03/2015  . Diastolic dysfunction 58/12/9831  . NSTEMI (non-ST elevated myocardial infarction) ([place]) 12/22/2014  . Trichomonal urethritis in male 02/27/2014  . Bilateral leg edema 02/27/2014  . Stutter 08/07/2013  . Resting tremor 08/07/2013  . Depression 06/04/2013  . Chronic low back pain 06/04/2013  . Headache(784.0) 05/23/2013  . Concussion 08/29/2012  . Encounter for medication refill 08/17/2011  . Knee pain, right 06/25/2011  . 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 GOUT NOS 02/23/2007  . Tobacco abuse 02/23/2007  . Hyperlipidemia 06/23/2006  . Morbid obesity due to excess calories ([place]) 06/23/2006  . Acquired trigger finger 06/23/2006  . Essential hypertension 06/23/2006  . LVH (left ventricular hypertrophy) 06/23/2006  . GASTROESOPHAGEAL REFLUX, NO ESOPHAGITIS 06/23/2006  . OSTEOARTHRITIS, LOWER LEG 06/23/2006  . Apnea, sleep 06/23/2006    Past Surgical History:  Procedure Laterality Date  . BIOPSY  09/27/2016   Procedure: BIOPSY;  Surgeon: [name], MD;  Location[facility];  Service: Endoscopy;;  esophagus and gastric  . COLONOSCOPY N/A 01/24/2017   Procedure: COLONOSCOPY;  Surgeon: [name], MD;  [facility];  Service: Endoscopy;  Laterality: N/A;  1:00pm  . CORONARY ANGIOPLASTY  2016  . ESOPHAGOGASTRODUODENOSCOPY N/A 09/27/2016   Procedure: ESOPHAGOGASTRODUODENOSCOPY (EGD);  Surgeon: [name], MD;  [facility];  Service: Endoscopy;  Laterality: N/A;  300  . 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   Marland Kitchen PARTIAL COLECTOMY N/A 06/27/2017   Procedure: PARTIAL COLECTOMY;  Surgeon: [name], MD;  Location: [facility];  Service: General;   Laterality: N/A;  . SAVORY DILATION N/A 09/27/2016   Procedure: SAVORY DILATION;  Surgeon: [name], MD;  Location[facility];  Service: Endoscopy;  Laterality: N/A;       Family History  Problem Relation Age of Onset  . High blood pressure Mother   . Cancer Maternal Grandmother   . Colon cancer Neg Hx     Social History   Tobacco Use  . Smoking status: Former Smoker    Packs/day: 0.50    Types: Cigarettes    Quit date: 04/28/2014    Years since quitting: 6.1  . Smokeless tobacco: Never Used  . Tobacco comment: decreased smoking  Vaping Use  . Vaping Use: Never used  Substance Use Topics  . Alcohol use: No  . Drug use: No    Home Medications Prior to Admission medications   Medication Sig Start Date End Date Taking? Authorizing Provider  amLODipine (NORVASC) 10 MG tablet TAKE 1 TABLET (10 MG TOTAL) BY MOUTH DAILY. 09/25/15   Virginia Crews, MD  aspirin EC 81 MG tablet Take 81 mg by mouth daily.    [provider]  atenolol (TENORMIN) 25 MG tablet Take 25 mg by mouth daily. 03/29/20   [provider]  atorvastatin (LIPITOR) 40 MG tablet Take 40 mg by mouth at bedtime.  03/04/16   [provider]  fluticasone (FLONASE) 50 MCG/ACT nasal spray Place into both nostrils. 01/03/20   [provider]  furosemide (LASIX) 80 MG tablet Take 80 mg by mouth daily. 03/11/20   [provider]  hydrochlorothiazide (HYDRODIURIL) 25 MG tablet TAKE 1 TABLET (25 MG TOTAL) BY MOUTH DAILY. Patient taking differently: Take 25 mg by mouth every Monday, Wednesday, and Friday. IN THE MORNING 10/27/15   Virginia Crews, MD  isosorbide mononitrate (IMDUR) 60 MG 24 hr tablet TAKE 1 TABLET BY MOUTH EVERY DAY 08/23/17   Herminio Commons, MD  KLOR-CON M20 20 MEQ tablet Take 20 mEq by mouth every Monday, Wednesday, and Friday.  03/04/16   [provider]  metoprolol (LOPRESSOR) 100 MG tablet TAKE 1 TABLET (100 MG TOTAL) BY MOUTH 2 (TWO)  TIMES DAILY. 06/25/14   Bacigalupo, Dionne Bucy, MD  nitroGLYCERIN (NITROSTAT) 0.4 MG SL tablet Place 1 tablet (0.4 mg total) under the tongue every 5 (five) minutes as needed for chest pain. 04/28/16   Herminio Commons, MD  NON FORMULARY Place 1 each into the nose at bedtime. CPAP    [provider]  pantoprazole (PROTONIX) 40 MG tablet TAKE 1 TABLET BY MOUTH EVERY DAY 03/19/19   Mahala Menghini, PA-C    Allergies    Other and Lenon Ahmadi (malabar nut tree) [justicia adhatoda]  Review of Systems   Review of Systems  All other systems reviewed and are negative.   Physical Exam Updated Vital Signs BP 122/89 (BP Location: Right Arm)   Pulse 74   Temp 97.9  F (36.6 C) (Oral)   Resp 19   Ht 5\' 10"  (1.778 m)   Wt 130.6 kg   SpO2 100%   BMI 41.32 kg/m   Physical Exam Vitals and nursing note reviewed.  Constitutional:      General: He is not in acute distress.    Appearance: Normal appearance. He is well-developed and well-nourished.  HENT:     Head: Normocephalic and atraumatic.      Mouth/Throat:     Mouth: Oropharynx is clear and moist.  Eyes:     Extraocular Movements: EOM normal.     Conjunctiva/sclera: Conjunctivae normal.     Pupils: Pupils are equal, round, and reactive to light.     Comments: Blindness with only minimal site out of the right eye  Cardiovascular:     Rate and Rhythm: Normal rate and regular rhythm.     Pulses: Intact distal pulses.     Heart sounds: No murmur heard.   Pulmonary:     Effort: Pulmonary effort is normal. No respiratory distress.     Breath sounds: Normal breath sounds. No wheezing or rales.  Abdominal:     General: There is no distension.     Palpations: Abdomen is soft.     Tenderness: There is no abdominal tenderness. There is no guarding or rebound.  Musculoskeletal:        General: Tenderness and signs of injury present. No edema.     Left shoulder: Tenderness and bony tenderness present. Decreased range of  motion. Normal strength. Normal pulse.     Left elbow: Normal.     Right wrist: Normal.     Left wrist: Normal.       Hands:     Cervical back: Normal range of motion and neck supple. No spinous process tenderness or muscular tenderness.  Skin:    General: Skin is warm and dry.     Findings: No erythema or rash.  Neurological:     General: No focal deficit present.     Mental Status: He is alert and oriented to person, place, and time. Mental status is at baseline.  Psychiatric:        Mood and Affect: Mood and affect and mood normal.        Behavior: Behavior normal.        Thought Content: Thought content normal.     ED Results / Procedures / Treatments   Labs (all labs ordered are listed, but only abnormal results are displayed) Labs Reviewed - No data to display  EKG None  Radiology DG Shoulder Left  Result Date: 07/02/2020 CLINICAL DATA:  Fall.  Pain. EXAM: LEFT SHOULDER - 2+ VIEW COMPARISON:  Chest x-ray 05/16/2013. FINDINGS: Acromioclavicular degenerative change. Corticated bony densities noted adjacent to the distal clavicle, most likely old fracture fragments. No acute fracture. No dislocation. No evidence of separation. IMPRESSION: Acromioclavicular degenerative change. Corticated bony densities noted adjacent to the distal clavicle, most likely old fracture fragments. No acute abnormality identified. Electronically Signed   By: Marcello Moores  Register   On: 07/02/2020 08:46   CT Maxillofacial Wo Contrast  Result Date: 07/02/2020 CLINICAL DATA:  Fall EXAM: CT MAXILLOFACIAL WITHOUT CONTRAST TECHNIQUE: Multidetector CT imaging of the maxillofacial structures was performed. Multiplanar CT image reconstructions were also generated. COMPARISON:  None. FINDINGS: Osseous: No acute facial fracture. Temporomandibular joints are unremarkable. Minor degenerative changes of the upper cervical spine. Orbits: No intraorbital hematoma. Sinuses: No significant opacification. Soft tissues: No  significant abnormality. Limited  intracranial: No acute abnormality. IMPRESSION: No acute facial fracture. Electronically Signed   By: Macy Mis M.D.   On: 07/02/2020 08:53    Procedures Procedures   LACERATION REPAIR Performed by: [employer] Authorized by: Blanchie Dessert Consent: Verbal consent obtained. Risks and benefits: risks, benefits and alternatives were discussed Consent given by: patient Patient identity confirmed: provided demographic data Prepped and Draped in normal sterile fashion Wound explored  Laceration Location: left ala of the nose  Laceration Length: 1.5cm  No Foreign Bodies seen or palpated  Anesthesia: none Irrigation method: syringe Amount of cleaning: standard  Skin closure: dermabond  Patient tolerance: Patient tolerated the procedure well with no immediate complications.  LACERATION REPAIR Performed by: [employer] Authorized by: Blanchie Dessert Consent: Verbal consent obtained. Risks and benefits: risks, benefits and alternatives were discussed Consent given by: patient Patient identity confirmed: provided demographic data Prepped and Draped in normal sterile fashion Wound explored  Laceration Location: right middle finger pad  Laceration Length: 1cm  No Foreign Bodies seen or palpated  Anesthesia:none Irrigation method: syringe Amount of cleaning: standard  Skin closure: dermabond   Patient tolerance: Patient tolerated the procedure well with no immediate complications.  Medications Ordered in ED Medications  Tdap (BOOSTRIX) injection 0.5 mL (has no administration in time range)  HYDROcodone-acetaminophen (NORCO/VICODIN) 5-325 MG per tablet 1 tablet (has no administration in time range)    ED Course  I have reviewed the triage vital signs and the nursing notes.  Pertinent labs & imaging results that were available during my care of the patient were reviewed by me and considered in my medical decision  making (see chart for details).    MDM Rules/Calculators/A&P                          Patient presenting today after a fall at work when he tripped over a pallet.  He landed on his left shoulder and hit his left side of the his nose on something.  Patient has no evidence of epistaxis at this time.  Superficial abrasion to the nose but no deep laceration requiring sutures.  Also small superficial skin tear to the right middle finger.  He denies any head injury or LOC.  He has no neck or back pain.  He is able to ambulate without difficulty.  He is unable to move his left shoulder due to pain.  Concern for humerus fracture, dislocation, clavicle fracture.  Patient given pain control and imaging is pending of the face and shoulder.  Tetanus shot was updated.  9:08 AM Images negative for acute injury.  Patient given pain control and follow-up with orthopedics.  Deep abrasions to the nose and finger repaired with Dermabond as above.  MDM Number of Diagnoses or Management Options   Amount and/or Complexity of Data Reviewed Tests in the radiology section of CPT: ordered and reviewed Independent visualization of images, tracings, or specimens: yes     Final Clinical Impression(s) / ED Diagnoses Final diagnoses:  Fall, initial encounter  Sprain of left shoulder, unspecified shoulder sprain type, initial encounter  Abrasion of nose, initial encounter    Rx / DC Orders ED Discharge Orders         Ordered    HYDROcodone-acetaminophen (NORCO/VICODIN) 5-325 MG tablet  Every 6 hours PRN        07/02/20 0912    lidocaine (LIDODERM) 5 %  Every 24 hours        07/02/20  9611           Blanchie Dessert, MD 07/02/20 [phone]

## 2020-07-30 ENCOUNTER — Encounter: Payer: Self-pay | Admitting: "Endocrinology

## 2020-07-30 ENCOUNTER — Other Ambulatory Visit: Payer: Self-pay

## 2020-07-30 ENCOUNTER — Ambulatory Visit (INDEPENDENT_AMBULATORY_CARE_PROVIDER_SITE_OTHER): Payer: BC Managed Care – PPO | Admitting: "Endocrinology

## 2020-07-30 VITALS — BP 112/80 | HR 52 | Ht 69.0 in | Wt 310.0 lb

## 2020-07-30 DIAGNOSIS — E0500 Thyrotoxicosis with diffuse goiter without thyrotoxic crisis or storm: Secondary | ICD-10-CM

## 2020-07-30 DIAGNOSIS — E0590 Thyrotoxicosis, unspecified without thyrotoxic crisis or storm: Secondary | ICD-10-CM

## 2020-07-30 NOTE — Progress Notes (Signed)
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   Past Medical History:  Diagnosis Date  . Carpal tunnel syndrome   . Condylomata acuminata in male   . Coronary artery disease   . Diverticulitis   . GERD (gastroesophageal reflux disease)   . Gout   . Heart attack ([place])   . Hyperlipidemia   . Hypertension   . Knee pain, bilateral   . Legal blindness   . Lumbar degenerative disc disease   . Myocardial infarction ([place]) 11/2014  . Obesity   . OSA (obstructive sleep apnea)   . Seizures ([place])    from accident; but on no meds and no more seizures since then.  . Thyroid disease   . Tobacco abuse   . Vitamin D deficiency     Past Surgical History:  Procedure Laterality Date  . BIOPSY  09/27/2016   Procedure: BIOPSY;  Surgeon: [name], MD;  [facility];  Service: Endoscopy;;  esophagus and gastric  . COLONOSCOPY N/A 01/24/2017   Procedure: COLONOSCOPY;  Surgeon: [name], MD;  [facility];  Service: Endoscopy;  Laterality: N/A;  1:00pm  . CORONARY ANGIOPLASTY  2016  . ESOPHAGOGASTRODUODENOSCOPY N/A 09/27/2016   Procedure: ESOPHAGOGASTRODUODENOSCOPY (EGD);  Surgeon: [name], MD;  [facility];  Service: Endoscopy;  Laterality: N/A;  300  . 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   Marland Kitchen PARTIAL COLECTOMY N/A 06/27/2017   Procedure: PARTIAL COLECTOMY;  Surgeon: [name], MD;  Location: [facility];  Service: General;  Laterality: N/A;  . SAVORY DILATION N/A 09/27/2016   Procedure: SAVORY DILATION;  Surgeon: [name], MD;  [facility];  Service: Endoscopy;  Laterality: N/A;    Social History   Socioeconomic History  . Marital status: Married    Spouse name: Not on file  . Number of children: Not on file  . Years of education: Not on  file  . Highest education level: Not on file  Occupational History  . Occupation: shipping and recieving    Comment: employer: [employer]   Tobacco Use  . Smoking status: Former Smoker    Packs/day: 0.50    Types: Cigarettes    Quit date: 04/28/2014    Years since quitting: 6.2  . Smokeless tobacco: Never Used  . Tobacco comment: decreased smoking  Vaping Use  . Vaping Use: Never used  Substance and Sexual Activity  . Alcohol use: No  . Drug use: No  . Sexual activity: Yes    Birth control/protection: None  Other Topics Concern  . Not on file  Social History Narrative   Pt currently lives with brother and sister in law    Social Determinants of Health   Financial Resource Strain: Not on file  Food Insecurity: Not on file  Transportation Needs: Not on file  Physical Activity: Not on file  Stress: Not on file  Social Connections: Not on file    Family History  Problem Relation Age of Onset  . High blood pressure Mother   . Cancer Maternal Grandmother   . Colon cancer Neg Hx     Outpatient Encounter Medications as of 07/30/2020  Medication Sig  . aspirin EC 81 MG tablet Take 81  mg by mouth daily.  Marland Kitchen atorvastatin (LIPITOR) 40 MG tablet Take 40 mg by mouth at bedtime.   . fluticasone (FLONASE) 50 MCG/ACT nasal spray Place into both nostrils.  . furosemide (LASIX) 80 MG tablet Take 80 mg by mouth daily as needed.  . hydrochlorothiazide (HYDRODIURIL) 25 MG tablet TAKE 1 TABLET (25 MG TOTAL) BY MOUTH DAILY. (Patient not taking: Reported on 07/30/2020)  . HYDROcodone-acetaminophen (NORCO/VICODIN) 5-325 MG tablet Take 1 tablet by mouth every 6 (six) hours as needed for severe pain. (Patient not taking: Reported on 07/30/2020)  . isosorbide mononitrate (IMDUR) 60 MG 24 hr tablet TAKE 1 TABLET BY MOUTH EVERY DAY  . KLOR-CON M20 20 MEQ tablet Take 20 mEq by mouth every Monday, Wednesday, and Friday.   . lidocaine (LIDODERM) 5 % Place 1 patch onto the skin daily. Remove & Discard patch within  12 hours or as directed by MD (Patient not taking: Reported on 07/30/2020)  . nitroGLYCERIN (NITROSTAT) 0.4 MG SL tablet Place 1 tablet (0.4 mg total) under the tongue every 5 (five) minutes as needed for chest pain.  . NON FORMULARY Place 1 each into the nose at bedtime. CPAP  . pantoprazole (PROTONIX) 40 MG tablet TAKE 1 TABLET BY MOUTH EVERY DAY  . [DISCONTINUED] amLODipine (NORVASC) 10 MG tablet TAKE 1 TABLET (10 MG TOTAL) BY MOUTH DAILY. (Patient not taking: Reported on 07/30/2020)  . [DISCONTINUED] atenolol (TENORMIN) 25 MG tablet Take 25 mg by mouth daily. (Patient not taking: Reported on 07/30/2020)  . [DISCONTINUED] metoprolol (LOPRESSOR) 100 MG tablet TAKE 1 TABLET (100 MG TOTAL) BY MOUTH 2 (TWO) TIMES DAILY.   No facility-administered encounter medications on file as of 07/30/2020.    ALLERGIES: Allergies  Allergen Reactions  . Other Anaphylaxis    [place] nuts  . Justicia Adhatoda (Malabar Nut Tree) [Justicia Adhatoda]     VACCINATION STATUS: Immunization History  Administered Date(s) Administered  . PFIZER(Purple Top)SARS-COV-2 Vaccination 07/16/2019, 08/13/2019  . Td 06/25/2003  . Tdap 07/02/2020     HPI  Mike Davenport is 50 y.o. male who presents today to follow-up after he was seen in consultation for hyperthyroidism due to Graves' disease. Referred by:[place] Nation, MD.  -He has received I-131 thyroid ablation on May 16, 2020.  He is reporting improvement in his prior symptoms.  He lost about 80 pounds prior to his last visit.  He has gained 12 more  pounds since last visit.  His previous symptoms of tremors, heat intolerance, anxiety, palpitations largely improved.  His previsit labs show treatment effect, however not in the hypothyroid range.  he denies dysphagia, choking, shortness of breath, no recent voice change.  He remains on beta blocker , was found to have bradycardia of 52 today.   he denies family history of thyroid dysfunction nor thyroid  malignancy.  - he denies personal history of goiter. he is not on any anti-thyroid medications nor on any thyroid hormone supplements. he  is willing to proceed with appropriate work up and therapy for thyrotoxicosis. His medical history includes legal blindness, coronary artery disease.                          Review of systems  Limited as above.   Objective:    BP 112/80   Pulse (!) 52   Ht 5\' 9"  (1.753 m)   Wt (!) 310 lb (140.6 kg)   BMI 45.78 kg/m   Wt Readings from Last 3  Encounters:  07/30/20 (!) 310 lb (140.6 kg)  07/02/20 288 lb (130.6 kg)  06/09/20 288 lb (130.6 kg)                                                Physical exam  Constitutional: Body mass index is 45.78 kg/m., not in acute distress, + normal state of mind    CMP     Component Value Date/Time   NA 136 06/29/2017 0555   K 3.6 06/29/2017 0555   CL 101 06/29/2017 0555   CO2 26 06/29/2017 0555   GLUCOSE 77 06/29/2017 0555   BUN 12 06/29/2017 0555   CREATININE 0.98 06/29/2017 0555   CREATININE 1.37 (H) 01/22/2014 1607   CALCIUM 8.7 (L) 06/29/2017 0555   PROT 6.3 08/17/2011 1218   ALBUMIN 3.9 08/17/2011 1218   AST 25 08/17/2011 1218   ALT 22 08/17/2011 1218   ALKPHOS 49 08/17/2011 1218   BILITOT 0.6 08/17/2011 1218   GFRNONAA >60 06/29/2017 0555   GFRAA >60 06/29/2017 0555     CBC    Component Value Date/Time   WBC 10.7 (H) 06/29/2017 0555   RBC 4.62 06/29/2017 0555   HGB 11.3 (L) 06/29/2017 0555   HCT 35.1 (L) 06/29/2017 0555   PLT 180 06/29/2017 0555   MCV 76.0 (L) 06/29/2017 0555   MCH 24.5 (L) 06/29/2017 0555   MCHC 32.2 06/29/2017 0555   RDW 15.4 06/29/2017 0555   LYMPHSABS 3.0 06/22/2017 1437   MONOABS 0.5 06/22/2017 1437   EOSABS 0.1 06/22/2017 1437   BASOSABS 0.0 06/22/2017 1437    Lipid Panel     Component Value Date/Time   CHOL 115 03/10/2020 0000   TRIG 67 03/10/2020 0000   HDL 49 03/10/2020 0000   CHOLHDL 4.1 04/07/2011 1456   VLDL 31 04/07/2011 1456   LDLCALC  52 03/10/2020 0000   LDLDIRECT 131 (H) 08/18/2007 2200     Lab Results  Component Value Date   TSH 0.01 (A) 06/28/2020   TSH <0.005 (L) 05/30/2020   TSH <0.005 (L) 03/24/2020   TSH 0.01 (A) 03/10/2020   FREET4 3.07 (H) 05/30/2020   FREET4 5.27 (H) 03/24/2020    Thyroid uptake and scan on March 28, 2020: FINDINGS:  Homogeneous diffusely increased tracer uptake throughout both  thyroid lobes.   No focal areas of increased or decreased tracer uptake seen.   4 hour I-123 uptake = 59% (normal 5-20%) , 24 hour I-123 uptake = 68% (normal 10-30%)   April 26, 2020 RAI thyroid ablation: 15.2 mCi.  Assessment & Plan:   1. Hyperthyroidism 2.  Graves' disease -He already received his ablative treatment with I-131 on May 16, 2020.  His previsit labs are indicative of  treatment effect, however he is not in the hypothyroid range.  He is not ready for thyroid hormone replacement.  -Patient is made aware of the high likelihood of post ablative hypothyroidism with subsequent need for lifelong thyroid hormone replacement. he  understands this outcome and is willing to return with another set of thyroid function test in  5 weeks.   He is advised to d/c all beta blockers for now, will continue  HCTZ and lasix PRN for hypertension.    -Patient is advised to maintain close follow up with [place] Nation, MD for primary care needs.     -  Time spent on this patient care encounter:  69minutes of which 50% was spent in  counseling and the rest reviewing  his current and  previous labs / studies and medications  doses and developing a plan for long term care, and documenting this care. Toma Aran  participated in the discussions, expressed understanding, and voiced agreement with the above plans.  All questions were answered to his satisfaction. he is encouraged to contact clinic should he have any questions or concerns prior to his return visit.    Follow up plan: Return in  about 5 weeks (around 09/03/2020) for F/U with Pre-visit Labs, [place] with Vannessa [facility].   Thank you for involving me in the care of this pleasant patient, and I will continue to update you with his progress.  Glade Lloyd, MD [facility] Phone: [phone]  Fax: [phone]   07/30/2020, 12:41 PM  This note was partially dictated with voice recognition software. Similar sounding words can be transcribed inadequately or may not  be corrected upon review.

## 2020-09-04 ENCOUNTER — Ambulatory Visit: Payer: Medicare Other | Admitting: "Endocrinology

## 2020-09-13 LAB — HEPATIC FUNCTION PANEL
ALT: 17 (ref 10–40)
AST: 31 (ref 14–40)

## 2020-09-13 LAB — BASIC METABOLIC PANEL
BUN: 15 (ref 4–21)
Creatinine: 1.4 — AB (ref 0.6–1.3)

## 2020-09-13 LAB — TSH: TSH: 88.1 — AB (ref 0.41–5.90)

## 2020-09-13 LAB — COMPREHENSIVE METABOLIC PANEL: Calcium: 8.4 — AB (ref 8.7–10.7)

## 2020-09-25 ENCOUNTER — Encounter: Payer: Self-pay | Admitting: "Endocrinology

## 2020-09-25 ENCOUNTER — Ambulatory Visit (INDEPENDENT_AMBULATORY_CARE_PROVIDER_SITE_OTHER): Payer: BC Managed Care – PPO | Admitting: "Endocrinology

## 2020-09-25 ENCOUNTER — Other Ambulatory Visit: Payer: Self-pay

## 2020-09-25 VITALS — BP 142/88 | HR 56 | Ht 69.0 in | Wt 332.0 lb

## 2020-09-25 DIAGNOSIS — E0500 Thyrotoxicosis with diffuse goiter without thyrotoxic crisis or storm: Secondary | ICD-10-CM

## 2020-09-25 DIAGNOSIS — E890 Postprocedural hypothyroidism: Secondary | ICD-10-CM | POA: Diagnosis not present

## 2020-09-25 MED ORDER — LEVOTHYROXINE SODIUM 100 MCG PO TABS: 100.0000 ug | ORAL_TABLET | Freq: Every day | ORAL | 1 refills | Status: DC

## 2020-09-25 NOTE — Progress Notes (Signed)
09/25/2020     Endocrinology follow-up note   Subjective:    Patient ID: Mike Davenport, male    DOB: 12-May-1970, PCP Tamora Nation, MD.   Past Medical History:  Diagnosis Date  . Carpal tunnel syndrome   . Condylomata acuminata in male   . Coronary artery disease   . Diverticulitis   . GERD (gastroesophageal reflux disease)   . Gout   . Heart attack ([place])   . Hyperlipidemia   . Hypertension   . Knee pain, bilateral   . Legal blindness   . Lumbar degenerative disc disease   . Myocardial infarction ([place]) 11/2014  . Obesity   . OSA (obstructive sleep apnea)   . Seizures ([place])    from accident; but on no meds and no more seizures since then.  . Thyroid disease   . Tobacco abuse   . Vitamin D deficiency     Past Surgical History:  Procedure Laterality Date  . BIOPSY  09/27/2016   Procedure: BIOPSY;  Surgeon: [name], MD;  [facility];  Service: Endoscopy;;  esophagus and gastric  . COLONOSCOPY N/A 01/24/2017   Procedure: COLONOSCOPY;  Surgeon: [name], MD;  [facility];  Service: Endoscopy;  Laterality: N/A;  1:00pm  . CORONARY ANGIOPLASTY  2016  . ESOPHAGOGASTRODUODENOSCOPY N/A 09/27/2016   Procedure: ESOPHAGOGASTRODUODENOSCOPY (EGD);  Surgeon: [name], MD;  [facility];  Service: Endoscopy;  Laterality: N/A;  300  . 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   Marland Kitchen PARTIAL COLECTOMY N/A 06/27/2017   Procedure: PARTIAL COLECTOMY;  Surgeon: [name], MD;  Location: [facility];  Service: General;  Laterality: N/A;  . SAVORY DILATION N/A 09/27/2016   Procedure: SAVORY DILATION;  Surgeon: [name], MD;  [facility];  Service: Endoscopy;  Laterality: N/A;    Social History   Socioeconomic History  . Marital status: Married    Spouse name: Not on file  . Number of children: Not on file  . Years of education: Not on  file  . Highest education level: Not on file  Occupational History  . Occupation: shipping and recieving    Comment: employer: [employer]   Tobacco Use  . Smoking status: Former Smoker    Packs/day: 0.50    Types: Cigarettes    Quit date: 04/28/2014    Years since quitting: 6.4  . Smokeless tobacco: Never Used  . Tobacco comment: decreased smoking  Vaping Use  . Vaping Use: Never used  Substance and Sexual Activity  . Alcohol use: No  . Drug use: No  . Sexual activity: Yes    Birth control/protection: None  Other Topics Concern  . Not on file  Social History Narrative   Pt currently lives with brother and sister in law    Social Determinants of Health   Financial Resource Strain: Not on file  Food Insecurity: Not on file  Transportation Needs: Not on file  Physical Activity: Not on file  Stress: Not on file  Social Connections: Not on file    Family History  Problem Relation Age of Onset  . High blood pressure Mother   . Cancer Maternal Grandmother   . Colon cancer Neg Hx     Outpatient Encounter Medications as of 09/25/2020  Medication Sig  . levothyroxine (SYNTHROID) 100 MCG tablet Take 1 tablet (  100 mcg total) by mouth daily before breakfast.  . aspirin EC 81 MG tablet Take 81 mg by mouth daily.  Marland Kitchen atorvastatin (LIPITOR) 40 MG tablet Take 40 mg by mouth at bedtime.   . fluticasone (FLONASE) 50 MCG/ACT nasal spray Place into both nostrils.  . furosemide (LASIX) 80 MG tablet Take 80 mg by mouth daily as needed.  . isosorbide mononitrate (IMDUR) 60 MG 24 hr tablet TAKE 1 TABLET BY MOUTH EVERY DAY  . KLOR-CON M20 20 MEQ tablet Take 20 mEq by mouth every Monday, Wednesday, and Friday.   . nitroGLYCERIN (NITROSTAT) 0.4 MG SL tablet Place 1 tablet (0.4 mg total) under the tongue every 5 (five) minutes as needed for chest pain.  . NON FORMULARY Place 1 each into the nose at bedtime. CPAP  . pantoprazole (PROTONIX) 40 MG tablet TAKE 1 TABLET BY MOUTH EVERY DAY  . [DISCONTINUED]  hydrochlorothiazide (HYDRODIURIL) 25 MG tablet TAKE 1 TABLET (25 MG TOTAL) BY MOUTH DAILY. (Patient not taking: Reported on 07/30/2020)  . [DISCONTINUED] HYDROcodone-acetaminophen (NORCO/VICODIN) 5-325 MG tablet Take 1 tablet by mouth every 6 (six) hours as needed for severe pain. (Patient not taking: Reported on 07/30/2020)  . [DISCONTINUED] lidocaine (LIDODERM) 5 % Place 1 patch onto the skin daily. Remove & Discard patch within 12 hours or as directed by MD (Patient not taking: Reported on 07/30/2020)   No facility-administered encounter medications on file as of 09/25/2020.    ALLERGIES: Allergies  Allergen Reactions  . Other Anaphylaxis    [place] nuts  . Justicia Adhatoda ([place]abar Nut Tree) [Justicia Adhatoda]     VACCINATION STATUS: Immunization History  Administered Date(s) Administered  . PFIZER(Purple Top)SARS-COV-2 Vaccination 07/16/2019, 08/13/2019  . Td 06/25/2003  . Tdap 07/02/2020     HPI  Mike Davenport is 50 y.o. male who presents today to follow-up after he was seen in consultation for hyperthyroidism due to Graves' disease. Referred by:[place] Nation, MD.  -He has received I-131 thyroid ablation on May 16, 2020.  He is reporting improvement in his prior symptoms.  He lost about 80 pounds prior to his last visit.  During his last visit he was not found to be hypothyroid yet.  He continues to gain weight.  In the interval, he was seen by his PCP who started him on levothyroxine 50 mcg p.o. daily.  This is based on thyroid function test showing treatment effect with clinical and biochemical hypothyroidism.    His previous symptoms of tremors, heat intolerance, anxiety, palpitations largely improved.  he denies dysphagia, choking, shortness of breath, no recent voice change.  He remains on beta blocker , was found to have bradycardia of 52 today.   he denies family history of thyroid dysfunction nor thyroid malignancy.  - he denies personal history of goiter. he is  not on any anti-thyroid medications nor on any thyroid hormone supplements. he  is willing to proceed with appropriate work up and therapy for thyrotoxicosis. His medical history includes legal blindness, coronary artery disease.                          Review of systems  Limited as above.   Objective:    BP (!) 142/88   Pulse (!) 56   Ht 5\' 9"  (1.753 m)   Wt (!) 332 lb (150.6 kg)   BMI 49.03 kg/m   Wt Readings from Last 3 Encounters:  09/25/20 (!) 332 lb (150.6 kg)  07/30/20 Marland Kitchen)  310 lb (140.6 kg)  07/02/20 288 lb (130.6 kg)                                                Physical exam  Constitutional: Body mass index is 49.03 kg/m., not in acute distress, + normal state of mind    CMP     Component Value Date/Time   NA 136 06/29/2017 0555   K 3.6 06/29/2017 0555   CL 101 06/29/2017 0555   CO2 26 06/29/2017 0555   GLUCOSE 77 06/29/2017 0555   BUN 15 09/13/2020 0000   CREATININE 1.4 (A) 09/13/2020 0000   CREATININE 0.98 06/29/2017 0555   CREATININE 1.37 (H) 01/22/2014 1607   CALCIUM 8.4 (A) 09/13/2020 0000   PROT 6.3 08/17/2011 1218   ALBUMIN 3.9 08/17/2011 1218   AST 31 09/13/2020 0000   ALT 17 09/13/2020 0000   ALKPHOS 49 08/17/2011 1218   BILITOT 0.6 08/17/2011 1218   GFRNONAA >60 06/29/2017 0555   GFRAA >60 06/29/2017 0555     CBC    Component Value Date/Time   WBC 10.7 (H) 06/29/2017 0555   RBC 4.62 06/29/2017 0555   HGB 11.3 (L) 06/29/2017 0555   HCT 35.1 (L) 06/29/2017 0555   PLT 180 06/29/2017 0555   MCV 76.0 (L) 06/29/2017 0555   MCH 24.5 (L) 06/29/2017 0555   MCHC 32.2 06/29/2017 0555   RDW 15.4 06/29/2017 0555   LYMPHSABS 3.0 06/22/2017 1437   MONOABS 0.5 06/22/2017 1437   EOSABS 0.1 06/22/2017 1437   BASOSABS 0.0 06/22/2017 1437    Lipid Panel     Component Value Date/Time   CHOL 115 03/10/2020 0000   TRIG 67 03/10/2020 0000   HDL 49 03/10/2020 0000   CHOLHDL 4.1 04/07/2011 1456   VLDL 31 04/07/2011 1456   LDLCALC 52 03/10/2020  0000   LDLDIRECT 131 (H) 08/18/2007 2200     Lab Results  Component Value Date   TSH 88.10 (A) 09/13/2020   TSH 0.01 (A) 06/28/2020   TSH <0.005 (L) 05/30/2020   TSH <0.005 (L) 03/24/2020   TSH 0.01 (A) 03/10/2020   FREET4 3.07 (H) 05/30/2020   FREET4 5.27 (H) 03/24/2020    Thyroid uptake and scan on March 28, 2020: FINDINGS:  Homogeneous diffusely increased tracer uptake throughout both  thyroid lobes.   No focal areas of increased or decreased tracer uptake seen.   4 hour I-123 uptake = 59% (normal 5-20%) , 24 hour I-123 uptake = 68% (normal 10-30%)  April 26, 2020 RAI thyroid ablation: 15.2 mCi.    Assessment & Plan:   1.  RAI induced hypothyroidism 2.  Graves' disease 3.  Morbid obesity -He already received his ablative treatment with I-131 on May 16, 2020.  His previsit labs are consistent with treatment effect with clinical and biochemical hypothyroidism.  He is going to benefit from a higher dose of levothyroxine.  I discussed and prescribe levothyroxine 100 mcg p.o. daily before breakfast  - We discussed about the correct intake of his thyroid hormone, on empty stomach at fasting, with water, separated by at least 30 minutes from breakfast and other medications,  and separated by more than 4 hours from calcium, iron, multivitamins, acid reflux medications (PPIs). -Patient is made aware of the fact that thyroid hormone replacement is needed for life, dose to be adjusted by  periodic monitoring of thyroid function tests.  Regarding his weight management, he gives history of weighing as high as 500 pounds in the past.  His recent weight gain is due to the fact that his hypothyroidism is stopped. - he acknowledges that there is a room for improvement in his food and drink choices.  -Plant predominant Whole Foods lifestyle nutrition is discussed and recommended to him. - Suggestion is made for him to avoid simple carbohydrates  from his diet including Cakes, Sweet  Desserts, Ice Cream, Soda (diet and regular), Sweet Tea, Candies, Chips, Cookies, Store Bought Juices, Alcohol in Excess of  1-2 drinks a day, Artificial Sweeteners,  Coffee Creamer, and "Sugar-free" Products, Lemonade. This will help patient to have more stable blood glucose profile and potentially avoid unintended weight gain.    He is advised to d/c all beta blockers for now, will continue  HCTZ and lasix PRN for hypertension.    -Patient is advised to maintain close follow up with [place] Nation, MD for primary care needs.    I spent 31 minutes in the care of the patient today including review of labs from Thyroid Function, CMP, and other relevant labs ; imaging/biopsy records (current and previous including abstractions from other facilities); face-to-face time discussing  his lab results and symptoms, medications doses, his options of short and long term treatment based on the latest standards of care / guidelines;   and documenting the encounter.  Toma Aran  participated in the discussions, expressed understanding, and voiced agreement with the above plans.  All questions were answered to his satisfaction. he is encouraged to contact clinic should he have any questions or concerns prior to his return visit.    Follow up plan: Return in about 9 weeks (around 11/27/2020) for F/U with Pre-visit Labs.   Thank you for involving me in the care of this pleasant patient, and I will continue to update you with his progress.  Glade Lloyd, MD [facility] Phone: [phone]  Fax: [phone]   09/25/2020, 4:40 PM  This note was partially dictated with voice recognition software. Similar sounding words can be transcribed inadequately or may not  be corrected upon review.

## 2020-09-25 NOTE — Patient Instructions (Addendum)
                                       Advice for Weight Management  -For most of [place] the best way to lose weight is by diet management. Generally speaking, diet management means consuming less calories intentionally which over time brings about progressive weight loss.  This can be achieved more effectively by restricting carbohydrate consumption to the minimum possible.  So, it is critically important to know your numbers: how much calorie you are consuming and how much calorie you need. More importantly, our carbohydrates sources should be unprocessed or minimally processed complex starch food items.   Sometimes, it is important to balance nutrition by increasing protein intake (animal or plant source), fruits, and vegetables.  - Whole Food, Plant Predominant Nutrition is highly recommended: Eat Plenty of vegetables, Mushrooms, fruits, Legumes, Whole Grains, Nuts, seeds in lieu of processed meats, processed snacks/pastries red meat, poultry, eggs.  -Sticking to a routine mealtime to eat 3 meals a day and avoiding unnecessary snacks is shown to have a big role in weight control. Under normal circumstances, the only time we lose real weight is when we are hungry, so allow hunger to take place- hunger means no food between meal times, only water.  It is not advisable to starve.   -It is better to avoid simple carbohydrates including: Cakes, Sweet Desserts, Ice Cream, Soda (diet and regular), Sweet Tea, Candies, Chips, Cookies, Store Bought Juices, Alcohol in Excess of  1-2 drinks a day, Lemonade,  Artificial Sweeteners, Doughnuts, Coffee Creamers, "Sugar-free" Products, etc, etc.  This is not a complete list...Marland Kitchen.    -Consulting with certified diabetes educators is proven to provide you with the most accurate and current information on diet.  Also, you may be  interested in discussing diet options/exchanges , we can schedule a visit with Jearld Fenton, RDN, CDE for  individualized nutrition education.  -Exercise: If you are able: 30 -60 minutes a day ,4 days a week, or 150 minutes a week.  The longer the better.  Combine stretch, strength, and aerobic activities.  If you were told in the past that you have high risk for cardiovascular diseases, you may seek evaluation by your heart doctor prior to initiating moderate to intense exercise programs.

## 2020-10-07 DIAGNOSIS — R072 Precordial pain: Secondary | ICD-10-CM | POA: Insufficient documentation

## 2020-10-07 DIAGNOSIS — I5032 Chronic diastolic (congestive) heart failure: Secondary | ICD-10-CM | POA: Insufficient documentation

## 2020-10-07 NOTE — Progress Notes (Signed)
Cardiology Office Note   Date:  10/08/2020   ID:  Mike Davenport, DOB 1970/05/08, MRN [id number]  PCP:  [place] Nation, MD  Cardiologist:   Minus Breeding, MD Referring:  [place] Nation, MD  No chief complaint on file.     History of Present Illness: Mike Davenport is a 50 y.o. male who was referred by [place] Nation, MD.  He needs preop clearance prior to her shoulder surgery.  He was previously seen by Dr. Bronson Ing.   He has a history of NSTEMI with an EF of 58%.  He had a stress test  in 2018 with anterolateral ischemia.  He was managed medically.  I was able to find a cardiac catheterization from 2016 at [facility]  He had 30 to 40% LAD stenosis.  He had 40% plaques in proximal mid segment of the circumflex.  Culprit vessel for non-STEMI was a diagonal which appeared to have an ostial lesion that was not amenable to percutaneous revascularization.  He is now getting ready to have arthroscopic shoulder surgery.  Of note he had a perfusion study last week.  It demonstrated a small mild fixed defect in the apical lateral and mid anterolateral segments.  There were no reversible ischemic areas noted.  However, ejection fraction was said to be 32%.  However, he also had an echocardiogram that demonstrated LVEF to be well preserved.  He had no significant valvular abnormalities.  He is legally blind.  He gets around with the assistance of a cane.  With his somewhat limited activities he denies any chest pressure, neck or arm discomfort.  He has no shortness of breath, PND or orthopnea.  No palpitations, presyncope or syncope.  He has had no weight gain or edema.   Past Medical History:  Diagnosis Date   Carpal tunnel syndrome    Condylomata acuminata in male    Coronary artery disease    Diverticulitis    GERD (gastroesophageal reflux disease)    Gout    Heart attack ([place])    Hyperlipidemia    Hypertension    Knee pain, bilateral    Legal blindness     Lumbar degenerative disc disease    Myocardial infarction ([place]) 11/2014   Obesity    OSA (obstructive sleep apnea)    Seizures ([place])    from accident; but on no meds and no more seizures since then.   Thyroid disease    Tobacco abuse    Vitamin D deficiency     Past Surgical History:  Procedure Laterality Date   BIOPSY  09/27/2016   Procedure: BIOPSY;  Surgeon: [name], MD;  [facility];  Service: Endoscopy;;  esophagus and gastric   COLONOSCOPY N/A 01/24/2017   Procedure: COLONOSCOPY;  Surgeon: [name], MD;  [facility];  Service: Endoscopy;  Laterality: N/A;  1:00pm   CORONARY ANGIOPLASTY  2016   ESOPHAGOGASTRODUODENOSCOPY N/A 09/27/2016   Procedure: ESOPHAGOGASTRODUODENOSCOPY (EGD);  Surgeon: [name], MD;  [facility];  Service: Endoscopy;  Laterality: N/A;  300   KNEE SURGERY     bilateral knee surgery for intrinsic knee disease and leg discrepency >25 years ago per pt.    PARTIAL COLECTOMY N/A 06/27/2017   Procedure: PARTIAL COLECTOMY;  Surgeon: [name], MD;  Location: [facility];  Service: General;  Laterality: N/A;   SAVORY DILATION N/A 09/27/2016   Procedure: SAVORY DILATION;  Surgeon: [name], MD;  [facility];  Service: Endoscopy;  Laterality: N/A;     Current Outpatient Medications  Medication Sig Dispense Refill   aspirin EC 81 MG tablet Take 81 mg by mouth daily.     fluticasone (FLONASE) 50 MCG/ACT nasal spray Place into both nostrils.     furosemide (LASIX) 80 MG tablet Take 80 mg by mouth daily as needed.     isosorbide mononitrate (IMDUR) 60 MG 24 hr tablet TAKE 1 TABLET BY MOUTH EVERY DAY 15 tablet 0   KLOR-CON M20 20 MEQ tablet Take 20 mEq by mouth every Monday, Wednesday, and Friday.   11   levothyroxine (SYNTHROID) 100 MCG tablet Take 1 tablet (100 mcg total) by mouth daily before breakfast. 30 tablet 1   nitroGLYCERIN (NITROSTAT) 0.4 MG SL tablet Place 1 tablet (0.4 mg total) under the  tongue every 5 (five) minutes as needed for chest pain. 25 tablet 3   NON FORMULARY Place 1 each into the nose at bedtime. CPAP     pantoprazole (PROTONIX) 40 MG tablet TAKE 1 TABLET BY MOUTH EVERY DAY 90 tablet 3   No current facility-administered medications for this visit.    Allergies:   Other and Justicia adhatoda (malabar nut tree) [justicia adhatoda]    Social History:  The patient  reports that he quit smoking about 6 years ago. His smoking use included cigarettes. He smoked an average of 0.50 packs per day. He has never used smokeless tobacco. He reports that he does not drink alcohol and does not use drugs.   Family History:  The patient's family history includes Cancer in his maternal grandmother; High blood pressure in his mother.    ROS:  Please see the history of present illness.   Otherwise, review of systems are positive for none.   All other systems are reviewed and negative.    PHYSICAL EXAM: VS:  BP (!) 156/84   Pulse (!) 59   Ht 5\' 9"  (1.753 m)   Wt (!) 328 lb 6.4 oz (149 kg)   SpO2 97%   BMI 48.50 kg/m  , BMI Body mass index is 48.5 kg/m. GENERAL:  Well appearing HEENT:  Pupils equal round and reactive, fundi not visualized, oral mucosa unremarkable NECK:  No jugular venous distention, waveform within normal limits, carotid upstroke brisk and symmetric, no bruits, no thyromegaly LYMPHATICS:  No cervical, inguinal adenopathy LUNGS:  Clear to auscultation bilaterally BACK:  No CVA tenderness CHEST:  Unremarkable HEART:  PMI not displaced or sustained,S1 and S2 within normal limits, no S3, no S4, no clicks, no rubs, no murmurs ABD:  Flat, positive bowel sounds normal in frequency in pitch, no bruits, no rebound, no guarding, no midline pulsatile mass, no hepatomegaly, no splenomegaly EXT:  2 plus pulses throughout, no edema, no cyanosis no clubbing SKIN:  No rashes no nodules NEURO:  Cranial nerves II through XII grossly intact, motor grossly intact  throughout PSYCH:  Cognitively intact, oriented to person place and time    EKG:  EKG is ordered today. The ekg ordered today demonstrates sinus rhythm, rate 59, premature atrial contractions, axis within normal limits, nonspecific anterior T wave changes, poor anterior R wave progression.   Recent Labs: 09/13/2020: ALT 17; BUN 15; Creatinine 1.4; TSH 88.10    Lipid Panel    Component Value Date/Time   CHOL 115 03/10/2020 0000   TRIG 67 03/10/2020 0000   HDL 49 03/10/2020 0000   CHOLHDL 4.1 04/07/2011 1456   VLDL 31  04/07/2011 1456   LDLCALC 52 03/10/2020 0000   LDLDIRECT 131 (H) 08/18/2007 2200      Wt Readings from Last 3 Encounters:  10/08/20 (!) 328 lb 6.4 oz (149 kg)  09/25/20 (!) 332 lb (150.6 kg)  07/30/20 (!) 310 lb (140.6 kg)      Other studies Reviewed: Additional studies/ records that were reviewed today include: Lexiscan Myoview, echo and notes from [facility]. Review of the above records demonstrates:  Please see elsewhere in the note.     ASSESSMENT AND PLAN:   CAD:   The patient has coronary disease as described.  However, he had low risk perfusion study and normal ejection fraction and no ongoing symptoms.  No further testing is suggested.  He needs aggressive risk reduction.   Hypertension: His blood pressure is mildly elevated.  We are going to try to send him a talking blood pressure cuff.  This is an unusual reading for him and he will keep a blood pressure diary.  We talked about conservative therapies.  I would like him to continue his beta-blocker.  He is being managed for Graves' disease but I would like him to continue his beta-blocker as he has been on it for years.  Hyperlipidemia: LDL is excellent and at target.  No change in therapy.  Obesity: We had a long conversation about this with plans to reduce carbohydrates.  Sleep apnea: He does use CPAP.  Preop: The patient has no contraindications to surgery according to ACC/AHA guidelines.  No  further cardiac testing is suggested.   Current medicines are reviewed at length with the patient today.  The patient does not have concerns regarding medicines.  The following changes have been made:  no change  Labs/ tests ordered today include:   Orders Placed This Encounter  Procedures   EKG 12-Lead     Disposition:   FU with me in one year.     Signed, Minus Breeding, MD  10/08/2020 1:06 PM    [facility]

## 2020-10-08 ENCOUNTER — Encounter: Payer: Self-pay | Admitting: Cardiology

## 2020-10-08 ENCOUNTER — Ambulatory Visit (INDEPENDENT_AMBULATORY_CARE_PROVIDER_SITE_OTHER): Payer: BC Managed Care – PPO | Admitting: Cardiology

## 2020-10-08 VITALS — BP 156/84 | HR 59 | Ht 69.0 in | Wt 328.4 lb

## 2020-10-08 DIAGNOSIS — R072 Precordial pain: Secondary | ICD-10-CM

## 2020-10-08 DIAGNOSIS — I5032 Chronic diastolic (congestive) heart failure: Secondary | ICD-10-CM

## 2020-10-08 DIAGNOSIS — E785 Hyperlipidemia, unspecified: Secondary | ICD-10-CM | POA: Diagnosis not present

## 2020-10-08 DIAGNOSIS — I10 Essential (primary) hypertension: Secondary | ICD-10-CM | POA: Diagnosis not present

## 2020-10-08 NOTE — Patient Instructions (Addendum)
Medication Instructions:  Your physician recommends that you continue on your current medications as directed. Please refer to the Current Medication list given to you today.  Labwork: none  Testing/Procedures: none  Follow-Up: Your physician recommends that you schedule a follow-up appointment in: 1 year. You will receive a reminder call or letter in the mail in about 10 months reminding you to call and schedule your appointment. If you don't receive this letter, please contact our office.  Any Other Special Instructions Will Be Listed Below (If Applicable). We will have our social worker Ulla Gallo) reach out to you about a talking blood pressure cuff.  If you need a refill on your cardiac medications before your next appointment, please call your pharmacy.

## 2020-10-20 ENCOUNTER — Other Ambulatory Visit: Payer: Self-pay | Admitting: "Endocrinology

## 2020-11-12 ENCOUNTER — Telehealth: Payer: Self-pay

## 2020-11-12 NOTE — Telephone Encounter (Signed)
Received paper work for disability - sent to [employer]

## 2020-11-14 NOTE — Progress Notes (Signed)
Sent message, via epic in basket, requesting orders in epic from surgeon.  

## 2020-11-19 NOTE — H&P (Signed)
Patient's anticipated LOS is less than 2 midnights, meeting these requirements: - Younger than 22 - Lives within 1 hour of care - Has a competent adult at home to recover with post-op recover - NO history of  - Chronic pain requiring opiods  - Diabetes  - Coronary Artery Disease  - Heart failure  - Heart attack  - Stroke  - DVT/VTE  - Cardiac arrhythmia  - Respiratory Failure/COPD  - Renal failure  - Anemia  - Advanced Liver disease     Mike Davenport is an 50 y.o. male.    Chief Complaint: left shoulder pain  HPI: Pt is a 50 y.o. male complaining of left shoulder pain for multiple years. Pain had continually increased since the beginning. X-rays in the clinic show rotator cuff tear left shoulder. Pt has tried various conservative treatments which have failed to alleviate their symptoms, including injections and therapy. Various options are discussed with the patient. Risks, benefits and expectations were discussed with the patient. Patient understand the risks, benefits and expectations and wishes to proceed with surgery.   PCP:   Nation, MD  D/C Plans: Home  PMH: Past Medical History:  Diagnosis Date   Carpal tunnel syndrome    Condylomata acuminata in male    Coronary artery disease    Diverticulitis    GERD (gastroesophageal reflux disease)    Gout    Heart attack ([place])    Hyperlipidemia    Hypertension    Knee pain, bilateral    Legal blindness    Lumbar degenerative disc disease    Myocardial infarction ([place]) 11/2014   Obesity    OSA (obstructive sleep apnea)    Seizures ([place])    from accident; but on no meds and no more seizures since then.   Thyroid disease    Tobacco abuse    Vitamin D deficiency     PSH: Past Surgical History:  Procedure Laterality Date   BIOPSY  09/27/2016   Procedure: BIOPSY;  Surgeon: [name], MD;  [facility];  Service: Endoscopy;;  esophagus and gastric   COLONOSCOPY N/A 01/24/2017    Procedure: COLONOSCOPY;  Surgeon: [name], MD;  [facility];  Service: Endoscopy;  Laterality: N/A;  1:00pm   CORONARY ANGIOPLASTY  2016   ESOPHAGOGASTRODUODENOSCOPY N/A 09/27/2016   Procedure: ESOPHAGOGASTRODUODENOSCOPY (EGD);  Surgeon: [name], MD;  [facility];  Service: Endoscopy;  Laterality: N/A;  300   KNEE SURGERY     bilateral knee surgery for intrinsic knee disease and leg discrepency >25 years ago per pt.    PARTIAL COLECTOMY N/A 06/27/2017   Procedure: PARTIAL COLECTOMY;  Surgeon: [name], MD;  Location: [facility];  Service: General;  Laterality: N/A;   SAVORY DILATION N/A 09/27/2016   Procedure: SAVORY DILATION;  Surgeon: [name], MD;  [facility];  Service: Endoscopy;  Laterality: N/A;    Social History:  reports that he quit smoking about 6 years ago. His smoking use included cigarettes. He smoked an average of .5 packs per day. He has never used smokeless tobacco. He reports that he does not drink alcohol and does not use drugs.  Allergies:  Allergies  Allergen Reactions   Other Anaphylaxis    [place] nuts   Justicia Adhatoda ([place] Nut Tree) [Justicia Adhatoda]     Medications: No current facility-administered medications for this encounter.   Current Outpatient Medications  Medication Sig Dispense Refill  aspirin EC 81 MG tablet Take 81 mg by mouth daily.     fluticasone (FLONASE) 50 MCG/ACT nasal spray Place into both nostrils.     furosemide (LASIX) 80 MG tablet Take 80 mg by mouth daily as needed.     isosorbide mononitrate (IMDUR) 60 MG 24 hr tablet TAKE 1 TABLET BY MOUTH EVERY DAY 15 tablet 0   KLOR-CON M20 20 MEQ tablet Take 20 mEq by mouth every Monday, Wednesday, and Friday.   11   levothyroxine (SYNTHROID) 100 MCG tablet TAKE 1 TABLET BY MOUTH DAILY BEFORE BREAKFAST. 30 tablet 2   metoprolol tartrate (LOPRESSOR) 100 MG tablet Take 100 mg by mouth 2 (two) times daily.     nitroGLYCERIN (NITROSTAT)  0.4 MG SL tablet Place 1 tablet (0.4 mg total) under the tongue every 5 (five) minutes as needed for chest pain. 25 tablet 3   NON FORMULARY Place 1 each into the nose at bedtime. CPAP     pantoprazole (PROTONIX) 40 MG tablet TAKE 1 TABLET BY MOUTH EVERY DAY 90 tablet 3    No results found for this or any previous visit (from the past 48 hour(s)). No results found.  ROS: Pain with rom of the left upper extremity  Physical Exam: Alert and oriented 50 y.o. male in no acute distress Cranial nerves 2-12 intact Cervical spine: full rom with no tenderness, nv intact distally Chest: active breath sounds bilaterally, no wheeze rhonchi or rales Heart: regular rate and rhythm, no murmur Abd: non tender non distended with active bowel sounds Hip is stable with rom  Left shoulder pain and weakness with ER and IR No rashes or edema  Assessment/Plan Assessment: left shoulder rotator cuff tear  Plan:  Patient will undergo a left shoulder rotator cuff repair by Dr. Veverly Fells at [facility] [place] Risks benefits and expectations were discussed with the patient. Patient understand risks, benefits and expectations and wishes to proceed. Preoperative templating of the joint replacement has been completed, documented, and submitted to the Operating Room personnel in order to optimize intra-operative equipment management.   Merla Riches PA-C, MPAS [facility] is now [employer] [address], [place], [place], [place] [address] Phone: [phone] [email or web address] Facebook  [employer]

## 2020-11-27 NOTE — Patient Instructions (Signed)
DUE TO COVID-19 ONLY ONE VISITOR IS ALLOWED TO COME WITH YOU AND STAY IN THE WAITING ROOM ONLY DURING PRE OP AND PROCEDURE DAY OF SURGERY. THE 1 VISITOR  MAY VISIT WITH YOU AFTER SURGERY IN YOUR PRIVATE ROOM DURING VISITING HOURS ONLY!               Mike Davenport   Your procedure is scheduled on: 12/05/20   Report to [facility] Main  Entrance   Report to admitting at : 10:45 AM     Call this number if you have problems the morning of surgery [phone]    Remember: NO SOLID FOOD AFTER MIDNIGHT THE NIGHT PRIOR TO SURGERY. NOTHING BY MOUTH EXCEPT CLEAR LIQUIDS UNTIL: 9:45 AM . PLEASE FINISH ENSURE DRINK PER SURGEON ORDER  WHICH NEEDS TO BE COMPLETED AT : 9:45 AM.   CLEAR LIQUID DIET  Foods Allowed                                                                     Foods Excluded  Coffee and tea, regular and decaf                             liquids that you cannot  Plain Jell-O any favor except red or purple                                           see through such as: Fruit ices (not with fruit pulp)                                     milk, soups, orange juice  Iced Popsicles                                    All solid food Carbonated beverages, regular and diet                                    Cranberry, grape and apple juices Sports drinks like Gatorade Lightly seasoned clear broth or consume(fat free) Sugar, honey syrup  Sample Menu Breakfast                                Lunch                                     Supper Cranberry juice                    Beef broth                            Chicken broth Jell-O  Grape juice                           Apple juice Coffee or tea                        Jell-O                                      Popsicle                                                Coffee or tea                        Coffee or tea  _____________________________________________________________________    BRUSH YOUR TEETH MORNING OF SURGERY AND RINSE YOUR MOUTH OUT, NO CHEWING GUM CANDY OR MINTS.    Take these medicines the morning of surgery with A SIP OF WATER: isosorbide,metoprolol,allopurinol,synthroid,pantoprazole.                               You may not have any metal on your body including hair pins and              piercings  Do not wear jewelry, lotions, powders or perfumes, deodorant             Men may shave face and neck.   Do not bring valuables to the hospital. [place].  Contacts, dentures or bridgework may not be worn into surgery.  Leave suitcase in the car. After surgery it may be brought to your room.     Patients discharged the day of surgery will not be allowed to drive home. IF YOU ARE HAVING SURGERY AND GOING HOME THE SAME DAY, YOU MUST HAVE AN ADULT TO DRIVE YOU HOME AND BE WITH YOU FOR 24 HOURS. YOU MAY GO HOME BY TAXI OR UBER OR ORTHERWISE, BUT AN ADULT MUST ACCOMPANY YOU HOME AND STAY WITH YOU FOR 24 HOURS.  Name and phone number of your driver:  Special Instructions: N/A              Please read over the following fact sheets you were given: _____________________________________________________________________           [facility] - Preparing for Surgery Before surgery, you can play an important role.  Because skin is not sterile, your skin needs to be as free of germs as possible.  You can reduce the number of germs on your skin by washing with CHG (chlorahexidine gluconate) soap before surgery.  CHG is an antiseptic cleaner which kills germs and bonds with the skin to continue killing germs even after washing. Please DO NOT use if you have an allergy to CHG or antibacterial soaps.  If your skin becomes reddened/irritated stop using the CHG and inform your nurse when you arrive at Short Stay. Do not shave (including legs and underarms) for at least 48 hours prior to the first CHG shower.  You may shave your  face/neck. Please follow these instructions carefully:  1.  Shower with CHG Soap the night before surgery and the  morning of Surgery.  2.  If you choose to wash your hair, wash your hair first as usual with your  normal  shampoo.  3.  After you shampoo, rinse your hair and body thoroughly to remove the  shampoo.                           4.  Use CHG as you would any other liquid soap.  You can apply chg directly  to the skin and wash                       Gently with a scrungie or clean washcloth.  5.  Apply the CHG Soap to your body ONLY FROM THE NECK DOWN.   Do not use on face/ open                           Wound or open sores. Avoid contact with eyes, ears mouth and genitals (private parts).                       Wash face,  Genitals (private parts) with your normal soap.             6.  Wash thoroughly, paying special attention to the area where your surgery  will be performed.  7.  Thoroughly rinse your body with warm water from the neck down.  8.  DO NOT shower/wash with your normal soap after using and rinsing off  the CHG Soap.                9.  Pat yourself dry with a clean towel.            10.  Wear clean pajamas.            11.  Place clean sheets on your bed the night of your first shower and do not  sleep with pets. Day of Surgery : Do not apply any lotions/deodorants the morning of surgery.  Please wear clean clothes to the hospital/surgery center.  FAILURE TO FOLLOW THESE INSTRUCTIONS MAY RESULT IN THE CANCELLATION OF YOUR SURGERY PATIENT SIGNATURE_________________________________  NURSE SIGNATURE__________________________________  ________________________________________________________________________   Adam Phenix  An incentive spirometer is a tool that can help keep your lungs clear and active. This tool measures how well you are filling your lungs with each breath. Taking long deep breaths may help reverse or decrease the chance of developing breathing  (pulmonary) problems (especially infection) following: A long period of time when you are unable to move or be active. BEFORE THE PROCEDURE  If the spirometer includes an indicator to show your best effort, your nurse or respiratory therapist will set it to a desired goal. If possible, sit up straight or lean slightly forward. Try not to slouch. Hold the incentive spirometer in an upright position. INSTRUCTIONS FOR USE  Sit on the edge of your bed if possible, or sit up as far as you can in bed or on a chair. Hold the incentive spirometer in an upright position. Breathe out normally. Place the mouthpiece in your mouth and seal your lips tightly around it. Breathe in slowly and as deeply as possible, raising the piston or the ball toward the top of the column. Hold your breath for 3-5 seconds or for as  long as possible. Allow the piston or ball to fall to the bottom of the column. Remove the mouthpiece from your mouth and breathe out normally. Rest for a few seconds and repeat Steps 1 through 7 at least 10 times every 1-2 hours when you are awake. Take your time and take a few normal breaths between deep breaths. The spirometer may include an indicator to show your best effort. Use the indicator as a goal to work toward during each repetition. After each set of 10 deep breaths, practice coughing to be sure your lungs are clear. If you have an incision (the cut made at the time of surgery), support your incision when coughing by placing a pillow or rolled up towels firmly against it. Once you are able to get out of bed, walk around indoors and cough well. You may stop using the incentive spirometer when instructed by your caregiver.  RISKS AND COMPLICATIONS Take your time so you do not get dizzy or light-headed. If you are in pain, you may need to take or ask for pain medication before doing incentive spirometry. It is harder to take a deep breath if you are having pain. AFTER USE Rest and  breathe slowly and easily. It can be helpful to keep track of a log of your progress. Your caregiver can provide you with a simple table to help with this. If you are using the spirometer at home, follow these instructions: [place] IF:  You are having difficultly using the spirometer. You have trouble using the spirometer as often as instructed. Your pain medication is not giving enough relief while using the spirometer. You develop fever of 100.5 F (38.1 C) or higher. SEEK IMMEDIATE MEDICAL CARE IF:  You cough up bloody sputum that had not been present before. You develop fever of 102 F (38.9 C) or greater. You develop worsening pain at or near the incision site. MAKE SURE YOU:  Understand these instructions. Will watch your condition. Will get help right away if you are not doing well or get worse. Document Released: 08/23/2006 Document Revised: 07/05/2011 Document Reviewed: 10/24/2006 [facility] Patient Information 2014 [place], [place].   ________________________________________________________________________

## 2020-11-28 ENCOUNTER — Other Ambulatory Visit: Payer: Self-pay

## 2020-11-28 ENCOUNTER — Encounter (HOSPITAL_COMMUNITY): Payer: Self-pay

## 2020-11-28 ENCOUNTER — Encounter (HOSPITAL_COMMUNITY)
Admission: RE | Admit: 2020-11-28 | Discharge: 2020-11-28 | Disposition: A | Discharge status: Home / Self Care | Payer: Worker's Compensation | Source: Ambulatory Visit | Attending: Orthopedic Surgery | Admitting: Orthopedic Surgery

## 2020-11-28 DIAGNOSIS — Z01812 Encounter for preprocedural laboratory examination: Secondary | ICD-10-CM | POA: Insufficient documentation

## 2020-11-28 HISTORY — DX: Hypothyroidism, unspecified: E03.9

## 2020-11-28 LAB — BASIC METABOLIC PANEL
Anion gap: 3 — ABNORMAL LOW (ref 5–15)
BUN: 13 mg/dL (ref 6–20)
CO2: 27 mmol/L (ref 22–32)
Calcium: 8.5 mg/dL — ABNORMAL LOW (ref 8.9–10.3)
Chloride: 109 mmol/L (ref 98–111)
Creatinine, Ser: 1.18 mg/dL (ref 0.61–1.24)
GFR, Estimated: >60 mL/min (ref >60)
Glucose, Bld: 86 mg/dL (ref 70–99)
Potassium: 4.5 mmol/L (ref 3.5–5.1)
Sodium: 139 mmol/L (ref 135–145)

## 2020-11-28 LAB — CBC
HCT: 47 % (ref 39.0–52.0)
Hemoglobin: 15.3 g/dL (ref 13.0–17.0)
MCH: 28.3 pg (ref 26.0–34.0)
MCHC: 32.6 g/dL (ref 30.0–36.0)
MCV: 86.9 fL (ref 80.0–100.0)
Platelets: 150 10*3/uL (ref 150–400)
RBC: 5.41 MIL/uL (ref 4.22–5.81)
RDW: 14.1 % (ref 11.5–15.5)
WBC: 5.4 10*3/uL (ref 4.0–10.5)
nRBC: 0 % (ref 0.0–0.2)

## 2020-11-28 NOTE — Progress Notes (Signed)
COVID Vaccine Completed: Yes Date COVID Vaccine completed: 08/13/19 x 2 COVID vaccine manufacturer: Pfizer      PCP - Dr. Stana Bunting Cardiologist - Dr. Dorna Mai. LOV: 10/08/20  Chest x-ray -  EKG - 10/08/20 : EPIC  Stress Test -  ECHO - 07/20/16 Cardiac Cath -  Pacemaker/ICD device last checked:  Sleep Study - Yes CPAP - Yes  Fasting Blood Sugar -  Checks Blood Sugar _____ times a day  Blood Thinner Instructions: Dr. Veverly Fells Aspirin Instructions: To hold it a week before surgery Last Dose:  Anesthesia review:Hx: HTN,MI,CAD,OSA(CPAP),seizure (many years ago).  Patient denies shortness of breath, fever, cough and chest pain at PAT appointment   Patient verbalized understanding of instructions that were given to them at the PAT appointment. Patient was also instructed that they will need to review over the PAT instructions again at home before surgery.

## 2020-12-01 NOTE — Progress Notes (Signed)
Anesthesia Chart Review   Case: [id number] Date/Time: 12/05/20 1229   Procedure: SHOULDER ARTHROSCOPY WITH ROTATOR CUFF REPAIR AND SUBACROMIAL DECOMPRESSION and open distal claivcle resection (Left) - with interscalene block   Anesthesia type: General   Pre-op diagnosis: Left shoulder rotator cuff tear   Location[facility] ROOM 06 / [facility]   Surgeons: [name], MD       DISCUSSION:50 y.o. former smoker with h/o HTN, OSA, GERD, CAD, left shoulder rotator cuff tear scheduled for above procedure 12/05/2020 with Dr. [name].   Pt seen by cardiology 10/08/2020. Per OV note stable at this visit with 1 year follow up recommended.  VS: BP (!) 149/90   Pulse (!) 57   Temp 36.6 C (Oral)   Ht '5\' 9"'$  (1.753 m)   Wt (!) 148.3 kg   SpO2 98%   BMI 48.29 kg/m   PROVIDERS: Stanton Nation, MD is PCP   Minus Breeding, MD is Cardiologist  LABS: Labs reviewed: Acceptable for surgery. (all labs ordered are listed, but only abnormal results are displayed)  Labs Reviewed  BASIC METABOLIC PANEL - Abnormal; Notable for the following components:      Result Value   Calcium 8.5 (*)    Anion gap 3 (*)    All other components within normal limits  CBC     IMAGES:   EKG: 10/08/2020 Rate 59 bpm  Sinus bradycardia with premature atrial complexes Abnormal QRS-T angle, consider primary T wave abnormality  CV:  Past Medical History:  Diagnosis Date   Carpal tunnel syndrome    Condylomata acuminata in male    Coronary artery disease    Diverticulitis    GERD (gastroesophageal reflux disease)    Gout    Heart attack ([place])    Hyperlipidemia    Hypertension    Hypothyroidism    Knee pain, bilateral    Legal blindness    Lumbar degenerative disc disease    Myocardial infarction ([place]) 11/2014   Obesity    OSA (obstructive sleep apnea)    Seizures ([place])    from accident; but on no meds and no more seizures since then.   Thyroid disease    Tobacco abuse    Vitamin D deficiency      Past Surgical History:  Procedure Laterality Date   BIOPSY  09/27/2016   Procedure: BIOPSY;  Surgeon: [name], MD;  [facility];  Service: Endoscopy;;  esophagus and gastric   COLONOSCOPY N/A 01/24/2017   Procedure: COLONOSCOPY;  Surgeon: [name], MD;  [facility];  Service: Endoscopy;  Laterality: N/A;  1:00pm   CORONARY ANGIOPLASTY  2016   ESOPHAGOGASTRODUODENOSCOPY N/A 09/27/2016   Procedure: ESOPHAGOGASTRODUODENOSCOPY (EGD);  Surgeon: [name], MD;  [facility];  Service: Endoscopy;  Laterality: N/A;  300   KNEE SURGERY     bilateral knee surgery for intrinsic knee disease and leg discrepency >25 years ago per pt.    PARTIAL COLECTOMY N/A 06/27/2017   Procedure: PARTIAL COLECTOMY;  Surgeon: [name], MD;  Location: [facility];  Service: General;  Laterality: N/A;   SAVORY DILATION N/A 09/27/2016   Procedure: SAVORY DILATION;  Surgeon: [name], MD;  [facility];  Service: Endoscopy;  Laterality: N/A;    MEDICATIONS:  allopurinol (ZYLOPRIM) 100 MG tablet   aspirin EC 81 MG tablet   cholecalciferol (VITAMIN D) 25 MCG (1000 UNIT) tablet   fluticasone (FLONASE) 50 MCG/ACT nasal spray  furosemide (LASIX) 80 MG tablet   isosorbide mononitrate (IMDUR) 60 MG 24 hr tablet   KLOR-CON M20 20 MEQ tablet   levothyroxine (SYNTHROID) 100 MCG tablet   metoprolol tartrate (LOPRESSOR) 100 MG tablet   nitroGLYCERIN (NITROSTAT) 0.4 MG SL tablet   NON FORMULARY   pantoprazole (PROTONIX) 40 MG tablet   No current facility-administered medications for this encounter.    Konrad Felix, PA-C [facility] [phone]

## 2020-12-02 ENCOUNTER — Ambulatory Visit: Payer: Medicare Other | Admitting: "Endocrinology

## 2020-12-03 ENCOUNTER — Other Ambulatory Visit: Payer: Medicare Other

## 2020-12-04 NOTE — Progress Notes (Signed)
Pt aware to arrive at [facility] admitting at 10:15 am on Friday 12/05/2020.

## 2020-12-05 ENCOUNTER — Encounter (HOSPITAL_COMMUNITY)
Admission: RE | Disposition: A | Discharge status: Home / Self Care | Payer: Self-pay | Source: Home / Self Care | Attending: Orthopedic Surgery

## 2020-12-05 ENCOUNTER — Encounter (HOSPITAL_COMMUNITY): Payer: Self-pay | Admitting: Orthopedic Surgery

## 2020-12-05 ENCOUNTER — Ambulatory Visit (HOSPITAL_COMMUNITY): Payer: Worker's Compensation | Admitting: Anesthesiology

## 2020-12-05 ENCOUNTER — Ambulatory Visit (HOSPITAL_COMMUNITY)
Admission: RE | Admit: 2020-12-05 | Discharge: 2020-12-05 | Disposition: A | Discharge status: Home / Self Care | Payer: Worker's Compensation | Attending: Orthopedic Surgery | Admitting: Orthopedic Surgery

## 2020-12-05 ENCOUNTER — Ambulatory Visit (HOSPITAL_COMMUNITY): Payer: Worker's Compensation | Admitting: Physician Assistant

## 2020-12-05 DIAGNOSIS — Z87891 Personal history of nicotine dependence: Secondary | ICD-10-CM | POA: Insufficient documentation

## 2020-12-05 DIAGNOSIS — Z79899 Other long term (current) drug therapy: Secondary | ICD-10-CM | POA: Diagnosis not present

## 2020-12-05 DIAGNOSIS — Z79890 Hormone replacement therapy (postmenopausal): Secondary | ICD-10-CM | POA: Insufficient documentation

## 2020-12-05 DIAGNOSIS — Z9049 Acquired absence of other specified parts of digestive tract: Secondary | ICD-10-CM | POA: Diagnosis not present

## 2020-12-05 DIAGNOSIS — M19012 Primary osteoarthritis, left shoulder: Secondary | ICD-10-CM | POA: Insufficient documentation

## 2020-12-05 DIAGNOSIS — M75102 Unspecified rotator cuff tear or rupture of left shoulder, not specified as traumatic: Secondary | ICD-10-CM | POA: Diagnosis present

## 2020-12-05 DIAGNOSIS — S43432A Superior glenoid labrum lesion of left shoulder, initial encounter: Secondary | ICD-10-CM | POA: Diagnosis not present

## 2020-12-05 DIAGNOSIS — X58XXXA Exposure to other specified factors, initial encounter: Secondary | ICD-10-CM | POA: Insufficient documentation

## 2020-12-05 DIAGNOSIS — Z9861 Coronary angioplasty status: Secondary | ICD-10-CM | POA: Diagnosis not present

## 2020-12-05 DIAGNOSIS — Z7982 Long term (current) use of aspirin: Secondary | ICD-10-CM | POA: Insufficient documentation

## 2020-12-05 HISTORY — PX: SHOULDER ARTHROSCOPY WITH ROTATOR CUFF REPAIR AND SUBACROMIAL DECOMPRESSION: SHX5686

## 2020-12-05 SURGERY — SHOULDER ARTHROSCOPY WITH ROTATOR CUFF REPAIR AND SUBACROMIAL DECOMPRESSION
Anesthesia: General | Site: Shoulder | Laterality: Left

## 2020-12-05 MED ORDER — SUGAMMADEX SODIUM 500 MG/5ML IV SOLN
INTRAVENOUS | Status: AC
  Filled 2020-12-05: qty 5

## 2020-12-05 MED ORDER — CEFAZOLIN IN SODIUM CHLORIDE 3-0.9 GM/100ML-% IV SOLN
3.0000 g | INTRAVENOUS | Status: AC
  Administered 2020-12-05: 3 g via INTRAVENOUS
  Filled 2020-12-05: qty 100

## 2020-12-05 MED ORDER — CHLORHEXIDINE GLUCONATE 0.12 % MT SOLN
15.0000 mL | Freq: Once | OROMUCOSAL | Status: AC
  Administered 2020-12-05: 15 mL via OROMUCOSAL

## 2020-12-05 MED ORDER — OXYCODONE-ACETAMINOPHEN 5-325 MG PO TABS: 1.0000 | ORAL_TABLET | ORAL | 0 refills | Status: AC | PRN

## 2020-12-05 MED ORDER — EPHEDRINE SULFATE-NACL 50-0.9 MG/10ML-% IV SOSY
PREFILLED_SYRINGE | INTRAVENOUS | Status: DC | PRN
  Administered 2020-12-05 (×4): 5 mg via INTRAVENOUS

## 2020-12-05 MED ORDER — MIDAZOLAM HCL 2 MG/2ML IJ SOLN
1.0000 mg | INTRAMUSCULAR | Status: DC
  Administered 2020-12-05: 2 mg via INTRAVENOUS

## 2020-12-05 MED ORDER — PHENYLEPHRINE 40 MCG/ML (10ML) SYRINGE FOR IV PUSH (FOR BLOOD PRESSURE SUPPORT)
PREFILLED_SYRINGE | INTRAVENOUS | Status: AC
  Filled 2020-12-05: qty 10

## 2020-12-05 MED ORDER — ROCURONIUM BROMIDE 10 MG/ML (PF) SYRINGE
PREFILLED_SYRINGE | INTRAVENOUS | Status: DC | PRN
  Administered 2020-12-05: 70 mg via INTRAVENOUS

## 2020-12-05 MED ORDER — PROPOFOL 10 MG/ML IV BOLUS
INTRAVENOUS | Status: DC | PRN
Start: 2020-12-05 — End: 2020-12-05
  Administered 2020-12-05: 150 mg via INTRAVENOUS

## 2020-12-05 MED ORDER — ROCURONIUM BROMIDE 10 MG/ML (PF) SYRINGE
PREFILLED_SYRINGE | INTRAVENOUS | Status: AC
  Filled 2020-12-05: qty 10

## 2020-12-05 MED ORDER — ONDANSETRON HCL 4 MG/2ML IJ SOLN
INTRAMUSCULAR | Status: DC | PRN
  Administered 2020-12-05: 4 mg via INTRAVENOUS

## 2020-12-05 MED ORDER — ORAL CARE MOUTH RINSE: 15.0000 mL | Freq: Once | OROMUCOSAL | Status: AC

## 2020-12-05 MED ORDER — DEXAMETHASONE SODIUM PHOSPHATE 10 MG/ML IJ SOLN
INTRAMUSCULAR | Status: DC | PRN
  Administered 2020-12-05: 10 mg via INTRAVENOUS

## 2020-12-05 MED ORDER — FENTANYL CITRATE (PF) 100 MCG/2ML IJ SOLN: 25.0000 ug | INTRAMUSCULAR | Status: DC | PRN

## 2020-12-05 MED ORDER — ONDANSETRON HCL 4 MG PO TABS: 4.0000 mg | ORAL_TABLET | Freq: Three times a day (TID) | ORAL | 1 refills | Status: AC | PRN

## 2020-12-05 MED ORDER — BUPIVACAINE HCL (PF) 0.25 % IJ SOLN
INTRAMUSCULAR | Status: DC | PRN
  Administered 2020-12-05: 15 mL

## 2020-12-05 MED ORDER — LIDOCAINE 2% (20 MG/ML) 5 ML SYRINGE
INTRAMUSCULAR | Status: DC | PRN
  Administered 2020-12-05: 80 mg via INTRAVENOUS

## 2020-12-05 MED ORDER — 0.9 % SODIUM CHLORIDE (POUR BTL) OPTIME
TOPICAL | Status: DC | PRN
  Administered 2020-12-05: 1000 mL

## 2020-12-05 MED ORDER — ONDANSETRON HCL 4 MG/2ML IJ SOLN
INTRAMUSCULAR | Status: AC
  Filled 2020-12-05: qty 2

## 2020-12-05 MED ORDER — FENTANYL CITRATE (PF) 250 MCG/5ML IJ SOLN
INTRAMUSCULAR | Status: DC | PRN
  Administered 2020-12-05 (×2): 50 ug via INTRAVENOUS

## 2020-12-05 MED ORDER — BUPIVACAINE LIPOSOME 1.3 % IJ SUSP
INTRAMUSCULAR | Status: DC | PRN
  Administered 2020-12-05: 10 mL via PERINEURAL

## 2020-12-05 MED ORDER — PHENYLEPHRINE HCL-NACL 20-0.9 MG/250ML-% IV SOLN
INTRAVENOUS | Status: AC
  Filled 2020-12-05: qty 250

## 2020-12-05 MED ORDER — LACTATED RINGERS IR SOLN
Status: DC | PRN
  Administered 2020-12-05: 6000 mL

## 2020-12-05 MED ORDER — PROPOFOL 10 MG/ML IV BOLUS
INTRAVENOUS | Status: AC
  Filled 2020-12-05: qty 20

## 2020-12-05 MED ORDER — LACTATED RINGERS IV SOLN: INTRAVENOUS | Status: DC

## 2020-12-05 MED ORDER — ONDANSETRON HCL 4 MG/2ML IJ SOLN: 4.0000 mg | Freq: Once | INTRAMUSCULAR | Status: DC | PRN

## 2020-12-05 MED ORDER — BUPIVACAINE-EPINEPHRINE 0.5% -1:200000 IJ SOLN
INTRAMUSCULAR | Status: DC | PRN
  Administered 2020-12-05: 8 mL

## 2020-12-05 MED ORDER — GLYCOPYRROLATE 0.2 MG/ML IJ SOLN
INTRAMUSCULAR | Status: DC | PRN
  Administered 2020-12-05: .2 mg via INTRAVENOUS

## 2020-12-05 MED ORDER — FENTANYL CITRATE (PF) 100 MCG/2ML IJ SOLN
50.0000 ug | INTRAMUSCULAR | Status: DC
  Administered 2020-12-05: 50 ug via INTRAVENOUS
  Filled 2020-12-05: qty 2

## 2020-12-05 MED ORDER — DEXAMETHASONE SODIUM PHOSPHATE 10 MG/ML IJ SOLN
INTRAMUSCULAR | Status: AC
  Filled 2020-12-05: qty 1

## 2020-12-05 MED ORDER — FENTANYL CITRATE (PF) 100 MCG/2ML IJ SOLN
INTRAMUSCULAR | Status: AC
  Filled 2020-12-05: qty 2

## 2020-12-05 MED ORDER — PHENYLEPHRINE 40 MCG/ML (10ML) SYRINGE FOR IV PUSH (FOR BLOOD PRESSURE SUPPORT)
PREFILLED_SYRINGE | INTRAVENOUS | Status: DC | PRN
  Administered 2020-12-05 (×5): 80 ug via INTRAVENOUS

## 2020-12-05 MED ORDER — SUGAMMADEX SODIUM 500 MG/5ML IV SOLN
INTRAVENOUS | Status: DC | PRN
Start: 2020-12-05 — End: 2020-12-05
  Administered 2020-12-05: 300 mg via INTRAVENOUS

## 2020-12-05 MED ORDER — LIDOCAINE 2% (20 MG/ML) 5 ML SYRINGE
INTRAMUSCULAR | Status: AC
  Filled 2020-12-05: qty 5

## 2020-12-05 MED ORDER — METHOCARBAMOL 500 MG PO TABS: 500.0000 mg | ORAL_TABLET | Freq: Four times a day (QID) | ORAL | 1 refills | Status: DC | PRN

## 2020-12-05 MED ORDER — EPHEDRINE 5 MG/ML INJ
INTRAVENOUS | Status: AC
  Filled 2020-12-05: qty 5

## 2020-12-05 MED ORDER — BUPIVACAINE-EPINEPHRINE (PF) 0.5% -1:200000 IJ SOLN
INTRAMUSCULAR | Status: AC
  Filled 2020-12-05: qty 30

## 2020-12-05 MED ORDER — ACETAMINOPHEN 500 MG PO TABS
1000.0000 mg | ORAL_TABLET | Freq: Once | ORAL | Status: AC
  Administered 2020-12-05: 1000 mg via ORAL
  Filled 2020-12-05: qty 2

## 2020-12-05 MED ORDER — DEXAMETHASONE SODIUM PHOSPHATE 10 MG/ML IJ SOLN: INTRAMUSCULAR | Status: DC | PRN

## 2020-12-05 SURGICAL SUPPLY — 51 items
AID PSTN UNV HD RSTRNT DISP (MISCELLANEOUS)
ANCH SUT 1.3 2 RBN BLU WHT (Anchor) ×1 IMPLANT
ANCH SUT 2 1.3X1 LD 1 STRN (Anchor) ×1 IMPLANT
ANCHOR ALL SUT RC W2 1.3 RIB (Anchor) ×2 IMPLANT
ANCHOR ALL-SUT FLEX 1.3 Y-KNOT (Anchor) ×2 IMPLANT
ANCHOR ALL-SUT RC W2 1.3 RIB (Anchor) IMPLANT
BAG COUNTER SPONGE SURGICOUNT (BAG) IMPLANT
BAG SPNG CNTER NS LX DISP (BAG)
BIT DRILL 1.3M DISPOSABLE (BIT) ×1 IMPLANT
BLADE SURG SZ11 CARB STEEL (BLADE) ×2 IMPLANT
BOOTIES KNEE HIGH SLOAN (MISCELLANEOUS) ×4 IMPLANT
BURR OVAL 8 FLU 5.0X13 (MISCELLANEOUS) ×1 IMPLANT
CLSR STERI-STRIP ANTIMIC 1/2X4 (GAUZE/BANDAGES/DRESSINGS) ×1 IMPLANT
COVER SURGICAL LIGHT HANDLE (MISCELLANEOUS) ×2 IMPLANT
CUTTER BONE 4.0MM X 13CM (MISCELLANEOUS) IMPLANT
DECANTER SPIKE VIAL GLASS SM (MISCELLANEOUS) ×2 IMPLANT
DRAPE SHOULDER BEACH CHAIR (DRAPES) ×2 IMPLANT
DRAPE STERI 35X30 U-POUCH (DRAPES) ×2 IMPLANT
DRAPE U-SHAPE 47X51 STRL (DRAPES) ×2 IMPLANT
DRSG EMULSION OIL 3X3 NADH (GAUZE/BANDAGES/DRESSINGS) ×2 IMPLANT
DRSG PAD ABDOMINAL 8X10 ST (GAUZE/BANDAGES/DRESSINGS) ×2 IMPLANT
DURAPREP 26ML APPLICATOR (WOUND CARE) ×2 IMPLANT
GAUZE SPONGE 4X4 12PLY STRL (GAUZE/BANDAGES/DRESSINGS) ×1 IMPLANT
GLOVE SURG ORTHO LTX SZ7.5 (GLOVE) ×2 IMPLANT
GLOVE SURG ORTHO LTX SZ8.5 (GLOVE) ×2 IMPLANT
GLOVE SURG UNDER POLY LF SZ7.5 (GLOVE) ×2 IMPLANT
GLOVE SURG UNDER POLY LF SZ8.5 (GLOVE) ×2 IMPLANT
GOWN STRL REUS W/TWL LRG LVL3 (GOWN DISPOSABLE) ×4 IMPLANT
KIT BASIN OR (CUSTOM PROCEDURE TRAY) ×4 IMPLANT
KIT TURNOVER KIT A (KITS) ×2 IMPLANT
MANIFOLD NEPTUNE II (INSTRUMENTS) ×4 IMPLANT
NDL SPNL 18GX3.5 QUINCKE PK (NEEDLE) ×1 IMPLANT
NEEDLE MAYO 6 CRC TAPER PT (NEEDLE) ×2 IMPLANT
NEEDLE MAYO CATGUT SZ4 (NEEDLE) ×2 IMPLANT
NEEDLE SPNL 18GX3.5 QUINCKE PK (NEEDLE) ×2 IMPLANT
PACK SHOULDER (CUSTOM PROCEDURE TRAY) ×2 IMPLANT
PENCIL SMOKE EVACUATOR (MISCELLANEOUS) IMPLANT
PORT APPOLLO RF 90DEGREE MULTI (SURGICAL WAND) ×1 IMPLANT
PROTECTOR NERVE ULNAR (MISCELLANEOUS) ×2 IMPLANT
RESTRAINT HEAD UNIVERSAL NS (MISCELLANEOUS) IMPLANT
SLING ARM IMMOBILIZER LRG (SOFTGOODS) IMPLANT
SLING ARM IMMOBILIZER MED (SOFTGOODS) ×2 IMPLANT
SUT BONE WAX W31G (SUTURE) ×1 IMPLANT
SUT ETHILON 4 0 PS 2 18 (SUTURE) ×2 IMPLANT
SUT HI-FI 2 STRAND C-2 40 (SUTURE) ×1 IMPLANT
SUT VIC AB 0 CT1 36 (SUTURE) ×2 IMPLANT
SUT VIC AB 0 CT2 27 (SUTURE) ×2 IMPLANT
TAPE PAPER 3X10 WHT MICROPORE (GAUZE/BANDAGES/DRESSINGS) ×1 IMPLANT
TOWEL OR 17X26 10 PK STRL BLUE (TOWEL DISPOSABLE) ×4 IMPLANT
TUBING ARTHROSCOPY IRRIG 16FT (MISCELLANEOUS) ×2 IMPLANT
TUBING CONNECTING 10 (TUBING) ×2 IMPLANT

## 2020-12-05 NOTE — Brief Op Note (Signed)
12/05/2020  2:40 PM  PATIENT:  Mike Davenport  50 y.o. male  PRE-OPERATIVE DIAGNOSIS:  Left shoulder rotator cuff tear, SLAP tear, AC DJD  POST-OPERATIVE DIAGNOSIS:  Left shoulder rotator cuff tear, SLAP tear, AC DJD  PROCEDURE:  Procedure(s) with comments: SHOULDER ARTHROSCOPY WITH ROTATOR CUFF REPAIR AND SUBACROMIAL DECOMPRESSION and open distal claivcle resection (Left) - with interscalene block, biceps tenodesis  SURGEON:  Surgeon(s) and Role:    [name], MD - Primary  PHYSICIAN ASSISTANT:   ASSISTANTS: Ventura Bruns, PA-C   ANESTHESIA:   regional and general  EBL:  minimal  BLOOD ADMINISTERED:none  DRAINS: none   LOCAL MEDICATIONS USED:  MARCAINE     SPECIMEN:  No Specimen  DISPOSITION OF SPECIMEN:  N/A  COUNTS:  YES  TOURNIQUET:  * No tourniquets in log *  DICTATION: .Other Dictation: Dictation Number [id number]  PLAN OF CARE: Discharge to home after PACU  PATIENT DISPOSITION:  PACU - hemodynamically stable.   Delay start of Pharmacological VTE agent (>24hrs) due to surgical blood loss or risk of bleeding: not applicable

## 2020-12-05 NOTE — Anesthesia Procedure Notes (Signed)
Anesthesia Regional Block: Interscalene brachial plexus block   Pre-Anesthetic Checklist: , timeout performed,  Correct Patient, Correct Site, Correct Laterality,  Correct Procedure, Correct Position, site marked,  Risks and benefits discussed,  Surgical consent,  Pre-op evaluation,  At surgeon's request and post-op pain management  Laterality: Left  Prep: chloraprep       Needles:  Injection technique: Single-shot  Needle Type: Echogenic Stimulator Needle     Needle Length: 9cm  Needle Gauge: 22     Additional Needles:   Procedures:,,,, ultrasound used (permanent image in chart),,    Narrative:  Start time: 12/05/2020 10:54 AM End time: 12/05/2020 11:00 AM Injection made incrementally with aspirations every 5 mL.  Performed by: Personally  Anesthesiologist: Catalina Gravel, MD  Additional Notes: Functioning IV was confirmed and monitors were applied.  A 66m 22ga Arrow echogenic stimulator needle was used. Sterile prep and drape, hand hygiene, and sterile gloves were used.  Negative aspiration and negative test dose prior to incremental administration of local anesthetic. The patient tolerated the procedure well.  Ultrasound guidance: relevent anatomy identified, needle position confirmed, local anesthetic spread visualized around nerve(s), vascular puncture avoided.  Image printed for medical record.

## 2020-12-05 NOTE — Transfer of Care (Signed)
Immediate Anesthesia Transfer of Care Note  Patient: Mike Davenport  Procedure(s) Performed: SHOULDER ARTHROSCOPY WITH ROTATOR CUFF REPAIR AND SUBACROMIAL DECOMPRESSION and open distal claivcle resection (Left: Shoulder)  Patient Location: PACU  Anesthesia Type:General and Regional  Level of Consciousness: awake, alert  and oriented  Airway & Oxygen Therapy: Patient Spontanous Breathing and Patient connected to face mask oxygen  Post-op Assessment: Report given to RN and Post -op Vital signs reviewed and stable  Post vital signs: Reviewed and stable  Last Vitals:  Vitals Value Taken Time  BP    Temp    Pulse 79 12/05/20 1443  Resp 19 12/05/20 1442  SpO2 100 % 12/05/20 1443  Vitals shown include unvalidated device data.  Last Pain:  Vitals:   12/05/20 1145  TempSrc:   PainSc: Asleep      Patients Stated Pain Goal: 4 ([id number])  Complications: No notable events documented.

## 2020-12-05 NOTE — Interval H&P Note (Signed)
History and Physical Interval Note:  12/05/2020 12:02 PM  Mike Davenport  has presented today for surgery, with the diagnosis of Left shoulder rotator cuff tear.  The various methods of treatment have been discussed with the patient and family. After consideration of risks, benefits and other options for treatment, the patient has consented to  Procedure(s) with comments: SHOULDER ARTHROSCOPY WITH ROTATOR CUFF REPAIR AND SUBACROMIAL DECOMPRESSION and open distal claivcle resection (Left) - with interscalene block as a surgical intervention.  The patient's history has been reviewed, patient examined, no change in status, stable for surgery.  I have reviewed the patient's chart and labs.  Questions were answered to the patient's satisfaction.     Augustin Schooling

## 2020-12-05 NOTE — Discharge Instructions (Signed)
Ice to the shoulder constantly.  Keep the incision covered and clean and dry for one week, then ok to get it wet in the shower.  Do exercise as instructed several times per day.   Do the following exercises: - Pendulums - Pillow slides - Gentle rotation exercises, hug and hitch hike   DO NOT reach behind your back or push up out of a chair with the operative arm.  Use a sling while you are up and around for comfort, may remove while seated.  Keep pillow propped behind the operative elbow.  Follow up with Dr Veverly Fells in two weeks in the office, call [phone] for appt

## 2020-12-05 NOTE — Anesthesia Preprocedure Evaluation (Addendum)
Anesthesia Evaluation  Patient identified by MRN, date of birth, ID band Patient awake    Reviewed: Allergy & Precautions, NPO status , Patient's Chart, lab work & pertinent test results, reviewed documented beta blocker date and time   Airway Mallampati: II  TM Distance: >3 FB Neck ROM: Full    Dental  (+) Dental Advisory Given, Chipped, Missing,    Pulmonary sleep apnea and Continuous Positive Airway Pressure Ventilation , former smoker,    Pulmonary exam normal breath sounds clear to auscultation       Cardiovascular hypertension, Pt. on home beta blockers and Pt. on medications (-) angina+ CAD and + Past MI  (-) Cardiac Stents Normal cardiovascular exam Rhythm:Regular Rate:Normal     Neuro/Psych  Headaches, Seizures -,  PSYCHIATRIC DISORDERS Depression    GI/Hepatic Neg liver ROS, GERD  Medicated,  Endo/Other  Hypothyroidism Morbid obesity  Renal/GU negative Renal ROS     Musculoskeletal negative musculoskeletal ROS (+)   Abdominal   Peds  Hematology negative hematology ROS (+) Plt 150k   Anesthesia Other Findings Day of surgery medications reviewed with the patient.  Reproductive/Obstetrics                            Anesthesia Physical Anesthesia Plan  ASA: 3  Anesthesia Plan: General   Post-op Pain Management:  Regional for Post-op pain   Induction: Intravenous  PONV Risk Score and Plan: 2 and Midazolam, Dexamethasone and Ondansetron  Airway Management Planned: Oral ETT  Additional Equipment:   Intra-op Plan:   Post-operative Plan: Extubation in OR  Informed Consent: I have reviewed the patients History and Physical, chart, labs and discussed the procedure including the risks, benefits and alternatives for the proposed anesthesia with the patient or authorized representative who has indicated his/her understanding and acceptance.     Dental advisory given  Plan  Discussed with: CRNA  Anesthesia Plan Comments:         Anesthesia Quick Evaluation

## 2020-12-05 NOTE — Anesthesia Postprocedure Evaluation (Signed)
Anesthesia Post Note  Patient: EZRAEL LEISCHNER  Procedure(s) Performed: SHOULDER ARTHROSCOPY WITH ROTATOR CUFF REPAIR AND SUBACROMIAL DECOMPRESSION and open distal claivcle resection (Left: Shoulder)     Patient location during evaluation: PACU Anesthesia Type: General Level of consciousness: awake and alert, awake and oriented Pain management: pain level controlled Vital Signs Assessment: post-procedure vital signs reviewed and stable Respiratory status: spontaneous breathing, nonlabored ventilation and respiratory function stable Cardiovascular status: blood pressure returned to baseline and stable Postop Assessment: no apparent nausea or vomiting Anesthetic complications: no   No notable events documented.  Last Vitals:  Vitals:   12/05/20 1500 12/05/20 1519  BP: 107/83 125/85  Pulse: 67 65  Resp:  14  Temp: 36.4 C (P) 36.4 C  SpO2: 100% 96%    Last Pain:  Vitals:   12/05/20 1519  TempSrc:   PainSc: 3                  Catalina Gravel

## 2020-12-05 NOTE — Progress Notes (Addendum)
AssistedDr. Gifford Shave with left, ultrasound guided, interscalene  brachial plexus block. Side rails up, monitors on throughout procedure. See vital signs in flow sheet. Tolerated Procedure well.

## 2020-12-05 NOTE — Anesthesia Procedure Notes (Signed)
Procedure Name: Intubation Date/Time: 12/05/2020 12:42 PM Performed by: Liller Yohn D, CRNA Pre-anesthesia Checklist: Patient identified, Emergency Drugs available, Suction available and Patient being monitored Patient Re-evaluated:Patient Re-evaluated prior to induction Oxygen Delivery Method: Circle system utilized Preoxygenation: Pre-oxygenation with 100% oxygen Induction Type: IV induction Ventilation: Mask ventilation without difficulty Laryngoscope Size: Mac and 4 Grade View: Grade I Tube type: Oral Number of attempts: 1 Airway Equipment and Method: Stylet Placement Confirmation: ETT inserted through vocal cords under direct vision, positive ETCO2 and breath sounds checked- equal and bilateral Secured at: 23 cm Tube secured with: Tape Dental Injury: Teeth and Oropharynx as per pre-operative assessment

## 2020-12-06 NOTE — Op Note (Signed)
Mike Davenport, Mike Davenport MEDICAL RECORD NO: [id number] ACCOUNT NO: [id number] DATE OF BIRTH: 1970-10-09 FACILITY: [facility] LOCATION: [facility] PHYSICIAN: Doran Heater. Veverly Fells, MD  Operative Report   DATE OF PROCEDURE: 12/05/2020  PREOPERATIVE DIAGNOSIS:  Left shoulder rotator cuff tear, SLAP tear and AC arthritis.  POSTOPERATIVE DIAGNOSIS:  Left shoulder rotator cuff tear, SLAP tear and AC arthritis.  PROCEDURE PERFORMED:  Left shoulder arthroscopy with extensive intra-articular debridement of torn superior labrum anterior to posterior, as well as arthroscopic biceps tenotomy, arthroscopic subacromial decompression followed by mini open rotator cuff  repair, biceps tenodesis in the groove and open distal clavicle resection.  ATTENDING SURGEON:  Doran Heater. Veverly Fells, MD.  ASSISTANT:  Charletta Cousin [place], [place], who was scrubbed during the entire procedure, and necessary for satisfactory completion of surgery.  ANESTHESIA:  General anesthesia plus interscalene block was used.  ESTIMATED BLOOD LOSS:  Minimal.  FLUID REPLACEMENT:  1200 mL crystalloid.  COUNTS:  Instrument counts were correct.  COMPLICATIONS:  No complications.  ANTIBIOTICS:  Perioperative antibiotics were given.  INDICATIONS:  The patient is a 50 year old male with a history of worsening left shoulder pain and loss of function and strength secondary to a torn rotator cuff.  The patient also has advanced AC arthritis with outlet impingement as well has a labrum  degeneration and tearing.  Having failed an extended period of conservative management the patient presents for operative treatment to restore function and eliminate pain.  Informed consent was obtained.  DESCRIPTION OF PROCEDURE:  After an adequate level of anesthesia was achieved, the patient was positioned in modified beach chair position.  Left shoulder correctly identified and sterilely prepped and draped in the usual manner.  Time out called,  verifying correct  patient, correct site.  We entered the patient's shoulder using a standard arthroscopic portals including anterior, posterior and lateral portals.  We identified significant tearing of the intra-articular portion of the biceps as well  as superior labrum.  We performed a biceps tenotomy and labral debridement back to stable labral rim.  Anterior inferior, posterior inferior labrum were intact.  Articular cartilage was normal.  Rotator cuff was torn involving supraspinatus and  infraspinatus.  Teres minor was intact.  Subscap was intact.  We then placed the scope in the subacromial space.  A bursectomy and acromioplasty was performed creating a type 1 acromial shape with a butcher block technique utilizing a high-speed burr.   We did release the CA ligament.  Rotator cuff was torn from the bursal side.  Having completed our subacromial decompression, our labrum, debridement and biceps tenotomy we concluded the arthroscopic portion of the procedure.  We then made a small saber  incision overlying the AC joint.  Dissection down through subcutaneous tissues using Bovie.  We performed a subperiosteal dissection of the distal clavicle splitting the deltotrapezial fascia in line with the distal clavicle.  We removed the distal 2-3  mm of bone using an oscillating saw.  We irrigated thoroughly, applied bone wax to the cut end of the clavicle, removing excess wax.  We then removed hypertrophied capsule and some spurs off the dorsal acromion.  Once we had everything decompressed, we  checked to make sure anterior and posterior AC ligaments were intact, which they were.  We then repaired the deltotrapezial fascia anatomically with 0 Vicryl suture followed by 2-0 Vicryl for subcutaneous closure and 4-0 Monocryl for skin.  We then  addressed the biceps and the rotator cuff through a single mini open incision starting at the  anterolateral border of the acromion and extending distally about 4 cm in the raphae between the  anterior and lateral heads of the deltoid.  Dissection down  through subcutaneous tissue, we split the deltoid, placed Arthrex retractor.  I identified the biceps groove.  We delivered the biceps tendon out of the wound.  We whipstitched with #2 Hi Fi suture to reinforce the tendon.  We then prepped the floor of  the biceps groove with a needle tip Bovie and a curette getting down to bleeding bone.  We placed a Y-knot flex anchor through the floor of the biceps groove and brought that suture up in a mattress fashion through the reinforced tendon tying it down  flush.  We took the longitudinal whipstitch sutures up through the rotator interval and tying over a soft tissue bridge incorporating part of the subscap.  We then oversewed the soft tissue over the top of the biceps tenodesis with 0 Vicryl suture in  figure-of-eight x2.  Next, we addressed the rotator cuff tear.  This was a significant 2 tendon tear.  We mobilized the tendon by grasping the full thickness of the tendon with #2 Hi-Fi suture.  We used a Cobb elevator in both the bursal side and the  joint side, mobilizing the tendon back to an anatomic position.  We used a curette and a rongeur to get the greater tuberosity down to bleeding bone.  We then did place a Y-Knot RC anchor loaded with #2 ribbons.  We then placed at the medial part of the  footprint bringing the suture up to the medial part of the rotator cuff tendon footprint.  We did some side-to-side sutures more posteriorly and then took the lateral sutures down through drill holes in the bone.  We had a nice double row repair  watertight.  We ranged the shoulder through a full arc of motion.  No impingement was noted.  No gapping of the sutures or gapping of the repair.  So we felt like we had a really strong double row repair.  We irrigated thoroughly and repaired the deltoid  anatomically with 0 Vicryl suture followed by 2-0 Vicryl for subcutaneous closure and 4-0 Monocryl for skin and  portals.  Steri-Strips were applied followed by sterile dressing.  The patient tolerated surgery well.   PUS D: 12/05/2020 2:46:43 pm T: 12/06/2020 4:55:00 am  JOB: [id number]

## 2020-12-08 ENCOUNTER — Encounter (HOSPITAL_COMMUNITY): Payer: Self-pay | Admitting: Orthopedic Surgery

## 2020-12-10 ENCOUNTER — Other Ambulatory Visit: Payer: Medicare Other

## 2020-12-10 ENCOUNTER — Ambulatory Visit: Payer: Medicare Other | Admitting: Urology

## 2020-12-17 ENCOUNTER — Ambulatory Visit: Payer: Medicare Other | Admitting: Urology

## 2021-01-22 ENCOUNTER — Other Ambulatory Visit: Payer: Medicare Other

## 2021-01-30 ENCOUNTER — Ambulatory Visit: Payer: Medicare Other | Admitting: Urology

## 2021-02-11 ENCOUNTER — Other Ambulatory Visit: Payer: Self-pay | Admitting: "Endocrinology

## 2021-02-26 ENCOUNTER — Other Ambulatory Visit: Payer: Self-pay | Admitting: "Endocrinology

## 2021-03-11 ENCOUNTER — Other Ambulatory Visit: Payer: Self-pay

## 2021-03-11 ENCOUNTER — Other Ambulatory Visit: Payer: Medicare Other

## 2021-03-11 DIAGNOSIS — R972 Elevated prostate specific antigen [PSA]: Secondary | ICD-10-CM

## 2021-03-12 LAB — PSA, TOTAL AND FREE
PSA, Free Pct: 15.6 %
PSA, Free: 0.25 ng/mL
Prostate Specific Ag, Serum: 1.6 ng/mL (ref 0.0–4.0)

## 2021-03-18 ENCOUNTER — Ambulatory Visit (INDEPENDENT_AMBULATORY_CARE_PROVIDER_SITE_OTHER): Payer: BC Managed Care – PPO | Admitting: Urology

## 2021-03-18 ENCOUNTER — Encounter: Payer: Self-pay | Admitting: Urology

## 2021-03-18 ENCOUNTER — Other Ambulatory Visit: Payer: Self-pay

## 2021-03-18 VITALS — BP 145/85 | HR 45

## 2021-03-18 DIAGNOSIS — R972 Elevated prostate specific antigen [PSA]: Secondary | ICD-10-CM

## 2021-03-18 LAB — URINALYSIS, ROUTINE W REFLEX MICROSCOPIC
Bilirubin, UA: NEGATIVE
Glucose, UA: NEGATIVE
Ketones, UA: NEGATIVE
Nitrite, UA: NEGATIVE
Specific Gravity, UA: 1.015 (ref 1.005–1.030)
Urobilinogen, Ur: 1 mg/dL (ref 0.2–1.0)
pH, UA: 7 (ref 5.0–7.5)

## 2021-03-18 LAB — MICROSCOPIC EXAMINATION
Bacteria, UA: NONE SEEN
Renal Epithel, UA: NONE SEEN /hpf

## 2021-03-18 NOTE — Progress Notes (Signed)
03/18/2021 4:06 PM   Mike Davenport [birth year] [id number]  Referring provider:  [place], MD [place],  [place] [address]  Followup elevated PSA   HPI: Mr Dirocco is a 50yo here for followup for elevated PSA. PSA is stable at 1.6. He has mild LUTS on no BPH therapy. IPSS 5 QOL 1. Urine stream strong, Nocturia 0-1x. NO other complaints today   PMH: Past Medical History:  Diagnosis Date   Carpal tunnel syndrome    Condylomata acuminata in male    Coronary artery disease    Diverticulitis    GERD (gastroesophageal reflux disease)    Gout    Heart attack ([place])    Hyperlipidemia    Hypertension    Hypothyroidism    Knee pain, bilateral    Legal blindness    Lumbar degenerative disc disease    Myocardial infarction ([place]) 11/2014   Obesity    OSA (obstructive sleep apnea)    Seizures ([place])    from accident; but on no meds and no more seizures since then.   Thyroid disease    Tobacco abuse    Vitamin D deficiency     Surgical History: Past Surgical History:  Procedure Laterality Date   BIOPSY  09/27/2016   Procedure: BIOPSY;  Surgeon: [name], MD;  Location: [facility];  Service: Endoscopy;;  esophagus and gastric   COLONOSCOPY N/A 01/24/2017   Procedure: COLONOSCOPY;  Surgeon: [name], MD;  Location: [facility];  Service: Endoscopy;  Laterality: N/A;  1:00pm   CORONARY ANGIOPLASTY  2016   ESOPHAGOGASTRODUODENOSCOPY N/A 09/27/2016   Procedure: ESOPHAGOGASTRODUODENOSCOPY (EGD);  Surgeon: [name], MD;  Location: [facility];  Service: Endoscopy;  Laterality: N/A;  300   KNEE SURGERY     bilateral knee surgery for intrinsic knee disease and leg discrepency >25 years ago per pt.    PARTIAL COLECTOMY N/A 06/27/2017   Procedure: PARTIAL COLECTOMY;  Surgeon: [name], MD;  Location: [facility];  Service: General;  Laterality: N/A;   SAVORY DILATION N/A 09/27/2016   Procedure: SAVORY DILATION;  Surgeon: [name], MD;   Location: [facility];  Service: Endoscopy;  Laterality: N/A;   SHOULDER ARTHROSCOPY WITH ROTATOR CUFF REPAIR AND SUBACROMIAL DECOMPRESSION Left 12/05/2020   Procedure: SHOULDER ARTHROSCOPY WITH ROTATOR CUFF REPAIR AND SUBACROMIAL DECOMPRESSION and open distal claivcle resection;  Surgeon: [name], MD;  Location: [facility];  Service: Orthopedics;  Laterality: Left;  with interscalene block    Home Medications:  Allergies as of 03/18/2021       Reactions   Other Anaphylaxis   [place] nuts   Justicia Adhatoda (malabar Nut Tree) [justicia Adhatoda]         Medication List        Accurate as of March 18, 2021  4:06 PM. If you have any questions, ask your nurse or doctor.          allopurinol 100 MG tablet Commonly known as: ZYLOPRIM Take 100 mg by mouth daily.   aspirin EC 81 MG tablet Take 81 mg by mouth daily.   cholecalciferol 25 MCG (1000 UNIT) tablet Commonly known as: VITAMIN D Take 1,000 Units by mouth daily.   fluticasone 50 MCG/ACT nasal spray Commonly known as: FLONASE Place 2 sprays into both nostrils daily.   furosemide 80 MG tablet Commonly known as: LASIX Take 80 mg by mouth daily as needed for fluid or edema.   isosorbide mononitrate  60 MG 24 hr tablet Commonly known as: IMDUR TAKE 1 TABLET BY MOUTH EVERY DAY   Klor-Con M20 20 MEQ tablet Generic drug: potassium chloride SA Take 20 mEq by mouth every other day.   levothyroxine 100 MCG tablet Commonly known as: SYNTHROID TAKE 1 TABLET BY MOUTH EVERY DAY BEFORE BREAKFAST   methocarbamol 500 MG tablet Commonly known as: Robaxin Take 1 tablet (500 mg total) by mouth every 6 (six) hours as needed for muscle spasms.   metoprolol tartrate 100 MG tablet Commonly known as: LOPRESSOR Take 100 mg by mouth 2 (two) times daily.   nitroGLYCERIN 0.4 MG SL tablet Commonly known as: NITROSTAT Place 1 tablet (0.4 mg total) under the tongue every 5 (five) minutes as needed for chest pain.   NON  FORMULARY Place 1 each into the nose at bedtime. CPAP   ondansetron 4 MG tablet Commonly known as: Zofran Take 1 tablet (4 mg total) by mouth every 8 (eight) hours as needed for nausea, vomiting or refractory nausea / vomiting.   oxyCODONE-acetaminophen 5-325 MG tablet Commonly known as: Percocet Take 1 tablet by mouth every 4 (four) hours as needed for severe pain.   pantoprazole 40 MG tablet Commonly known as: PROTONIX TAKE 1 TABLET BY MOUTH EVERY DAY        Allergies:  Allergies  Allergen Reactions   Other Anaphylaxis    [place] nuts   Justicia Adhatoda ([place]) [Justicia Adhatoda]     Family History: Family History  Problem Relation Age of Onset   High blood pressure Mother    Cancer Maternal Grandmother    Colon cancer Neg Hx     Social History:  reports that he quit smoking about 6 years ago. His smoking use included cigarettes. He smoked an average of .5 packs per day. He has never used smokeless tobacco. He reports that he does not drink alcohol and does not use drugs.  ROS: All other review of systems were reviewed and are negative except what is noted above in HPI  Physical Exam: BP (!) 145/85   Pulse (!) 45   Constitutional:  Alert and oriented, No acute distress. HEENT: [place] AT, moist mucus membranes.  Trachea midline, no masses. Cardiovascular: No clubbing, cyanosis, or edema. Respiratory: Normal respiratory effort, no increased work of breathing. GI: Abdomen is soft, nontender, nondistended, no abdominal masses GU: No CVA tenderness.  Lymph: No cervical or inguinal lymphadenopathy. Skin: No rashes, bruises or suspicious lesions. Neurologic: Grossly intact, no focal deficits, moving all 4 extremities. Psychiatric: Normal mood and affect.  Laboratory Data: Lab Results  Component Value Date   WBC 5.4 11/28/2020   HGB 15.3 11/28/2020   HCT 47.0 11/28/2020   MCV 86.9 11/28/2020   PLT 150 11/28/2020    Lab Results  Component Value Date    CREATININE 1.18 11/28/2020    No results found for: PSA  No results found for: TESTOSTERONE  No results found for: HGBA1C  Urinalysis    Component Value Date/Time   APPEARANCEUR Clear 06/09/2020 1325   GLUCOSEU Negative 06/09/2020 1325   BILIRUBINUR Negative 06/09/2020 1325   PROTEINUR 3+ (A) 06/09/2020 1325   UROBILINOGEN 1.0 02/27/2014 1624   NITRITE Negative 06/09/2020 1325   LEUKOCYTESUR Negative 06/09/2020 1325    Lab Results  Component Value Date   LABMICR See below: 06/09/2020   WBCUA None seen 06/09/2020   LABEPIT None seen 06/09/2020   MUCUS Present 06/09/2020   BACTERIA None seen 06/09/2020  Pertinent Imaging:  No results found for this or any previous visit.  No results found for this or any previous visit.  No results found for this or any previous visit.  No results found for this or any previous visit.  No results found for this or any previous visit.  No results found for this or any previous visit.  No results found for this or any previous visit.  No results found for this or any previous visit.   Assessment & Plan:    1. Elevated PSA -RTC 1 year with PSA    No follow-ups on file.  Nicolette Bang, MD  [facility] [place]

## 2021-03-18 NOTE — Patient Instructions (Signed)

## 2021-03-18 NOTE — Progress Notes (Signed)
Urological Symptom Review  Patient is experiencing the following symptoms: Frequent urination Hard to postpone urination Get up at night to urinate   Review of Systems  Gastrointestinal (upper)  : Negative for upper GI symptoms  Gastrointestinal (lower) : Negative for lower GI symptoms  Constitutional : Negative for symptoms  Skin: Negative for skin symptoms  Eyes: Negative for eye symptoms  Ear/Nose/Throat : Negative for Ear/Nose/Throat symptoms  Hematologic/Lymphatic: Negative for Hematologic/Lymphatic symptoms  Cardiovascular : Leg swelling  Respiratory : Negative for respiratory symptoms  Endocrine: Negative for endocrine symptoms  Musculoskeletal: Back pain Joint pain  Neurological: Negative for neurological symptoms  Psychologic: Negative for psychiatric symptoms

## 2021-03-29 ENCOUNTER — Other Ambulatory Visit: Payer: Self-pay | Admitting: "Endocrinology

## 2021-09-12 DIAGNOSIS — I10 Essential (primary) hypertension: Secondary | ICD-10-CM | POA: Diagnosis not present

## 2021-09-12 DIAGNOSIS — I252 Old myocardial infarction: Secondary | ICD-10-CM | POA: Diagnosis not present

## 2021-09-12 DIAGNOSIS — G4733 Obstructive sleep apnea (adult) (pediatric): Secondary | ICD-10-CM | POA: Diagnosis not present

## 2021-09-12 DIAGNOSIS — E039 Hypothyroidism, unspecified: Secondary | ICD-10-CM | POA: Diagnosis not present

## 2021-09-15 ENCOUNTER — Telehealth: Payer: Self-pay | Admitting: "Endocrinology

## 2021-09-15 ENCOUNTER — Other Ambulatory Visit: Payer: Self-pay

## 2021-09-15 DIAGNOSIS — R7989 Other specified abnormal findings of blood chemistry: Secondary | ICD-10-CM | POA: Diagnosis not present

## 2021-09-15 DIAGNOSIS — D509 Iron deficiency anemia, unspecified: Secondary | ICD-10-CM | POA: Diagnosis not present

## 2021-09-15 DIAGNOSIS — Z0001 Encounter for general adult medical examination with abnormal findings: Secondary | ICD-10-CM | POA: Diagnosis not present

## 2021-09-15 DIAGNOSIS — I10 Essential (primary) hypertension: Secondary | ICD-10-CM | POA: Diagnosis not present

## 2021-09-15 DIAGNOSIS — R5383 Other fatigue: Secondary | ICD-10-CM | POA: Diagnosis not present

## 2021-09-15 DIAGNOSIS — E890 Postprocedural hypothyroidism: Secondary | ICD-10-CM

## 2021-09-15 DIAGNOSIS — E039 Hypothyroidism, unspecified: Secondary | ICD-10-CM | POA: Diagnosis not present

## 2021-09-15 LAB — VITAMIN D 25 HYDROXY (VIT D DEFICIENCY, FRACTURES): Vit D, 25-Hydroxy: 23.1

## 2021-09-15 LAB — BASIC METABOLIC PANEL
BUN: 14 (ref 4–21)
CO2: 27 — AB (ref 13–22)
Chloride: 100 (ref 99–108)
Creatinine: 1.2 (ref 0.6–1.3)
Glucose: 90
Potassium: 4.6 mEq/L (ref 3.5–5.1)
Sodium: 138 (ref 137–147)

## 2021-09-15 LAB — COMPREHENSIVE METABOLIC PANEL
Albumin: 4 (ref 3.5–5.0)
Calcium: 8.9 (ref 8.7–10.7)
Globulin: 2.5

## 2021-09-15 LAB — HEPATIC FUNCTION PANEL
ALT: 11 U/L (ref 10–40)
AST: 14 (ref 14–40)
Alkaline Phosphatase: 71 (ref 25–125)
Bilirubin, Total: 0.3

## 2021-09-15 LAB — PSA: PSA: 1.2

## 2021-09-15 LAB — HEMOGLOBIN A1C: Hemoglobin A1C: 5.4

## 2021-09-15 LAB — LIPID PANEL
Cholesterol: 202 — AB (ref 0–200)
HDL: 50 (ref 35–70)
LDL Cholesterol: 134
Triglycerides: 102 (ref 40–160)

## 2021-09-15 LAB — TSH: TSH: 17.9 — AB (ref 0.41–5.90)

## 2021-09-15 NOTE — Progress Notes (Signed)
free 

## 2021-09-15 NOTE — Telephone Encounter (Signed)
Pt called to schedule f/u. Informed him to complete his thyroid labs and once they are complete to give [place] a call and we can put him on the sch for a f./u

## 2021-10-14 DIAGNOSIS — I10 Essential (primary) hypertension: Secondary | ICD-10-CM | POA: Diagnosis not present

## 2021-10-14 DIAGNOSIS — R609 Edema, unspecified: Secondary | ICD-10-CM | POA: Diagnosis not present

## 2021-10-14 DIAGNOSIS — Z0001 Encounter for general adult medical examination with abnormal findings: Secondary | ICD-10-CM | POA: Diagnosis not present

## 2021-10-14 DIAGNOSIS — E039 Hypothyroidism, unspecified: Secondary | ICD-10-CM | POA: Diagnosis not present

## 2021-10-14 DIAGNOSIS — G4733 Obstructive sleep apnea (adult) (pediatric): Secondary | ICD-10-CM | POA: Diagnosis not present

## 2021-10-14 DIAGNOSIS — I252 Old myocardial infarction: Secondary | ICD-10-CM | POA: Diagnosis not present

## 2021-10-15 ENCOUNTER — Encounter: Payer: Self-pay | Admitting: "Endocrinology

## 2021-11-03 DIAGNOSIS — E039 Hypothyroidism, unspecified: Secondary | ICD-10-CM | POA: Diagnosis not present

## 2021-11-03 DIAGNOSIS — Z13220 Encounter for screening for lipoid disorders: Secondary | ICD-10-CM | POA: Diagnosis not present

## 2021-11-03 DIAGNOSIS — E0500 Thyrotoxicosis with diffuse goiter without thyrotoxic crisis or storm: Secondary | ICD-10-CM | POA: Diagnosis not present

## 2021-11-03 LAB — TSH: TSH: 0.71 (ref 0.41–5.90)

## 2021-11-05 ENCOUNTER — Encounter: Payer: Self-pay | Admitting: "Endocrinology

## 2021-12-11 DIAGNOSIS — I270 Primary pulmonary hypertension: Secondary | ICD-10-CM | POA: Diagnosis not present

## 2021-12-11 DIAGNOSIS — G4733 Obstructive sleep apnea (adult) (pediatric): Secondary | ICD-10-CM | POA: Diagnosis not present

## 2021-12-11 DIAGNOSIS — J449 Chronic obstructive pulmonary disease, unspecified: Secondary | ICD-10-CM | POA: Diagnosis not present

## 2022-03-10 ENCOUNTER — Other Ambulatory Visit: Payer: Medicare Other

## 2022-03-10 DIAGNOSIS — R972 Elevated prostate specific antigen [PSA]: Secondary | ICD-10-CM

## 2022-03-12 DIAGNOSIS — J449 Chronic obstructive pulmonary disease, unspecified: Secondary | ICD-10-CM | POA: Diagnosis not present

## 2022-03-12 DIAGNOSIS — I270 Primary pulmonary hypertension: Secondary | ICD-10-CM | POA: Diagnosis not present

## 2022-03-12 DIAGNOSIS — G4733 Obstructive sleep apnea (adult) (pediatric): Secondary | ICD-10-CM | POA: Diagnosis not present

## 2022-03-12 LAB — PSA: Prostate Specific Ag, Serum: 1.7 ng/mL (ref 0.0–4.0)

## 2022-03-16 ENCOUNTER — Encounter: Payer: Self-pay | Admitting: Internal Medicine

## 2022-03-16 ENCOUNTER — Ambulatory Visit (INDEPENDENT_AMBULATORY_CARE_PROVIDER_SITE_OTHER): Payer: Medicare Other | Admitting: Urology

## 2022-03-16 VITALS — BP 132/80 | HR 64

## 2022-03-16 DIAGNOSIS — D126 Benign neoplasm of colon, unspecified: Secondary | ICD-10-CM

## 2022-03-16 DIAGNOSIS — R972 Elevated prostate specific antigen [PSA]: Secondary | ICD-10-CM

## 2022-03-16 NOTE — Progress Notes (Signed)
03/16/2022 11:44 AM   Mike Davenport November 03, 1970 [id number]  Referring provider: Vergas Nation, MD [place],  [place] [address]  Followup elevated PSA  HPI: Mr Apachito is a 51yo here for followup for elevated PSA. PSA is stable at 1.7. IPSS 4 QOL 0 on no BPH therapy. Urine stream strong. No straining to urinate. No other complaints today   PMH: Past Medical History:  Diagnosis Date   Carpal tunnel syndrome    Condylomata acuminata in male    Coronary artery disease    Diverticulitis    GERD (gastroesophageal reflux disease)    Gout    Heart attack ([place])    Hyperlipidemia    Hypertension    Hypothyroidism    Knee pain, bilateral    Legal blindness    Lumbar degenerative disc disease    Myocardial infarction ([place]) 11/2014   Obesity    OSA (obstructive sleep apnea)    Seizures ([place])    from accident; but on no meds and no more seizures since then.   Thyroid disease    Tobacco abuse    Vitamin D deficiency     Surgical History: Past Surgical History:  Procedure Laterality Date   BIOPSY  09/27/2016   Procedure: BIOPSY;  Surgeon: [name], MD;  [facility];  Service: Endoscopy;;  esophagus and gastric   COLONOSCOPY N/A 01/24/2017   Procedure: COLONOSCOPY;  Surgeon: [name], MD;  [facility];  Service: Endoscopy;  Laterality: N/A;  1:00pm   CORONARY ANGIOPLASTY  2016   ESOPHAGOGASTRODUODENOSCOPY N/A 09/27/2016   Procedure: ESOPHAGOGASTRODUODENOSCOPY (EGD);  Surgeon: [name], MD;  [facility];  Service: Endoscopy;  Laterality: N/A;  300   KNEE SURGERY     bilateral knee surgery for intrinsic knee disease and leg discrepency >25 years ago per pt.    PARTIAL COLECTOMY N/A 06/27/2017   Procedure: PARTIAL COLECTOMY;  Surgeon: [name], MD;  Location: [facility];  Service: General;  Laterality: N/A;   SAVORY DILATION N/A 09/27/2016   Procedure: SAVORY DILATION;  Surgeon: [name], MD;  [facility];  Service: Endoscopy;  Laterality: N/A;   SHOULDER ARTHROSCOPY WITH ROTATOR CUFF REPAIR AND SUBACROMIAL DECOMPRESSION Left 12/05/2020   Procedure: SHOULDER ARTHROSCOPY WITH ROTATOR CUFF REPAIR AND SUBACROMIAL DECOMPRESSION and open distal claivcle resection;  Surgeon: [name], MD;  [facility];  Service: Orthopedics;  Laterality: Left;  with interscalene block    Home Medications:  Allergies as of 03/16/2022       Reactions   Other Anaphylaxis   [place] nuts   Justicia Adhatoda (malabar Nut Tree) [justicia Adhatoda]         Medication List        Accurate as of March 16, 2022 11:44 AM. If you have any questions, ask your nurse or doctor.          allopurinol 100 MG tablet Commonly known as: ZYLOPRIM Take 100 mg by mouth daily.   aspirin EC 81 MG tablet Take 81 mg by mouth daily.   cholecalciferol 25 MCG (1000 UNIT) tablet Commonly known as: VITAMIN D3 Take 1,000 Units by mouth daily.   fluticasone 50 MCG/ACT nasal spray Commonly known as: FLONASE Place 2 sprays into both nostrils daily.   furosemide 80 MG tablet Commonly known as: LASIX Take 80 mg by mouth daily as needed for fluid or edema.   isosorbide mononitrate 60 MG 24 hr  tablet Commonly known as: IMDUR TAKE 1 TABLET BY MOUTH EVERY DAY   Klor-Con M20 20 MEQ tablet Generic drug: potassium chloride SA Take 20 mEq by mouth every other day.   levothyroxine 100 MCG tablet Commonly known as: SYNTHROID TAKE 1 TABLET BY MOUTH EVERY DAY BEFORE BREAKFAST   methocarbamol 500 MG tablet Commonly known as: Robaxin Take 1 tablet (500 mg total) by mouth every 6 (six) hours as needed for muscle spasms.   metoprolol tartrate 100 MG tablet Commonly known as: LOPRESSOR Take 100 mg by mouth 2 (two) times daily.   nitroGLYCERIN 0.4 MG SL tablet Commonly known as: NITROSTAT Place 1 tablet (0.4 mg total) under the tongue every 5 (five) minutes as needed for chest pain.   NON  FORMULARY Place 1 each into the nose at bedtime. CPAP   pantoprazole 40 MG tablet Commonly known as: PROTONIX TAKE 1 TABLET BY MOUTH EVERY DAY        Allergies:  Allergies  Allergen Reactions   Other Anaphylaxis    [place] nuts   Justicia Adhatoda ([place]) [Justicia Adhatoda]     Family History: Family History  Problem Relation Age of Onset   High blood pressure Mother    Cancer Maternal Grandmother    Colon cancer Neg Hx     Social History:  reports that he quit smoking about 7 years ago. His smoking use included cigarettes. He smoked an average of .5 packs per day. He has never used smokeless tobacco. He reports that he does not drink alcohol and does not use drugs.  ROS: All other review of systems were reviewed and are negative except what is noted above in HPI  Physical Exam: BP 132/80   Pulse 64   Constitutional:  Alert and oriented, No acute distress. HEENT: [place] AT, moist mucus membranes.  Trachea midline, no masses. Cardiovascular: No clubbing, cyanosis, or edema. Respiratory: Normal respiratory effort, no increased work of breathing. GI: Abdomen is soft, nontender, nondistended, no abdominal masses GU: No CVA tenderness.  Lymph: No cervical or inguinal lymphadenopathy. Skin: No rashes, bruises or suspicious lesions. Neurologic: Grossly intact, no focal deficits, moving all 4 extremities. Psychiatric: Normal mood and affect.  Laboratory Data: Lab Results  Component Value Date   WBC 5.4 11/28/2020   HGB 15.3 11/28/2020   HCT 47.0 11/28/2020   MCV 86.9 11/28/2020   PLT 150 11/28/2020    Lab Results  Component Value Date   CREATININE 1.2 09/15/2021    Lab Results  Component Value Date   PSA 1.2 09/15/2021    No results found for: "TESTOSTERONE"  Lab Results  Component Value Date   HGBA1C 5.4 09/15/2021    Urinalysis    Component Value Date/Time   APPEARANCEUR Clear 03/18/2021 1705   GLUCOSEU Negative 03/18/2021 1705    BILIRUBINUR Negative 03/18/2021 1705   PROTEINUR 2+ (A) 03/18/2021 1705   UROBILINOGEN 1.0 02/27/2014 1624   NITRITE Negative 03/18/2021 1705   LEUKOCYTESUR Trace (A) 03/18/2021 1705    Lab Results  Component Value Date   LABMICR See below: 03/18/2021   WBCUA 0-5 03/18/2021   LABEPIT 0-10 03/18/2021   MUCUS Present 03/18/2021   BACTERIA None seen 03/18/2021    Pertinent Imaging:  No results found for this or any previous visit.  No results found for this or any previous visit.  No results found for this or any previous visit.  No results found for this or any previous visit.  No results found for  this or any previous visit.  No valid procedures specified. No results found for this or any previous visit.  No results found for this or any previous visit.   Assessment & Plan:    1. Elevated PSA RTC 1 year with PSA - Urinalysis, Routine w reflex microscopic   No follow-ups on file.  Nicolette Bang, MD  [facility] [place]

## 2022-03-17 ENCOUNTER — Ambulatory Visit: Payer: Medicare Other | Admitting: Urology

## 2022-03-17 LAB — MICROSCOPIC EXAMINATION

## 2022-03-17 LAB — URINALYSIS, ROUTINE W REFLEX MICROSCOPIC
Bilirubin, UA: NEGATIVE
Glucose, UA: NEGATIVE
Nitrite, UA: NEGATIVE
Specific Gravity, UA: 1.03 (ref 1.005–1.030)
Urobilinogen, Ur: 2 mg/dL — ABNORMAL HIGH (ref 0.2–1.0)
pH, UA: 5.5 (ref 5.0–7.5)

## 2022-03-23 ENCOUNTER — Encounter: Payer: Self-pay | Admitting: Urology

## 2022-03-23 NOTE — Patient Instructions (Signed)

## 2022-04-05 DIAGNOSIS — I10 Essential (primary) hypertension: Secondary | ICD-10-CM | POA: Diagnosis not present

## 2022-04-05 DIAGNOSIS — Z13220 Encounter for screening for lipoid disorders: Secondary | ICD-10-CM | POA: Diagnosis not present

## 2022-04-05 DIAGNOSIS — R7989 Other specified abnormal findings of blood chemistry: Secondary | ICD-10-CM | POA: Diagnosis not present

## 2022-04-05 DIAGNOSIS — I252 Old myocardial infarction: Secondary | ICD-10-CM | POA: Diagnosis not present

## 2022-04-05 DIAGNOSIS — E039 Hypothyroidism, unspecified: Secondary | ICD-10-CM | POA: Diagnosis not present

## 2022-04-05 DIAGNOSIS — E0500 Thyrotoxicosis with diffuse goiter without thyrotoxic crisis or storm: Secondary | ICD-10-CM | POA: Diagnosis not present

## 2022-04-09 DIAGNOSIS — Z23 Encounter for immunization: Secondary | ICD-10-CM | POA: Diagnosis not present

## 2022-04-09 DIAGNOSIS — I252 Old myocardial infarction: Secondary | ICD-10-CM | POA: Diagnosis not present

## 2022-04-09 DIAGNOSIS — I10 Essential (primary) hypertension: Secondary | ICD-10-CM | POA: Diagnosis not present

## 2022-04-09 DIAGNOSIS — E039 Hypothyroidism, unspecified: Secondary | ICD-10-CM | POA: Diagnosis not present

## 2022-04-09 DIAGNOSIS — R609 Edema, unspecified: Secondary | ICD-10-CM | POA: Diagnosis not present

## 2022-04-09 DIAGNOSIS — G4733 Obstructive sleep apnea (adult) (pediatric): Secondary | ICD-10-CM | POA: Diagnosis not present

## 2022-04-27 ENCOUNTER — Ambulatory Visit: Payer: Medicare Other | Admitting: Nurse Practitioner

## 2022-04-28 ENCOUNTER — Ambulatory Visit: Payer: Medicare Other | Admitting: Internal Medicine

## 2022-04-29 ENCOUNTER — Encounter: Payer: Self-pay | Admitting: Gastroenterology

## 2022-05-11 ENCOUNTER — Telehealth: Payer: Self-pay

## 2022-05-11 ENCOUNTER — Encounter: Payer: Self-pay | Admitting: Nurse Practitioner

## 2022-05-11 ENCOUNTER — Ambulatory Visit: Payer: 59 | Attending: Nurse Practitioner | Admitting: Nurse Practitioner

## 2022-05-11 VITALS — BP 124/81 | HR 69 | Ht 70.0 in | Wt 369.0 lb

## 2022-05-11 DIAGNOSIS — G4733 Obstructive sleep apnea (adult) (pediatric): Secondary | ICD-10-CM | POA: Diagnosis not present

## 2022-05-11 DIAGNOSIS — E785 Hyperlipidemia, unspecified: Secondary | ICD-10-CM

## 2022-05-11 DIAGNOSIS — I2510 Atherosclerotic heart disease of native coronary artery without angina pectoris: Secondary | ICD-10-CM

## 2022-05-11 DIAGNOSIS — I10 Essential (primary) hypertension: Secondary | ICD-10-CM | POA: Diagnosis not present

## 2022-05-11 MED ORDER — METOPROLOL TARTRATE 100 MG PO TABS: 100.0000 mg | ORAL_TABLET | Freq: Two times a day (BID) | ORAL | 3 refills | Status: DC

## 2022-05-11 MED ORDER — ROSUVASTATIN CALCIUM 10 MG PO TABS: 10.0000 mg | ORAL_TABLET | Freq: Every day | ORAL | 3 refills | Status: AC

## 2022-05-11 MED ORDER — NITROGLYCERIN 0.4 MG SL SUBL: 0.4000 mg | SUBLINGUAL_TABLET | SUBLINGUAL | 3 refills | Status: DC | PRN

## 2022-05-11 MED ORDER — KLOR-CON M20 20 MEQ PO TBCR: 20.0000 meq | EXTENDED_RELEASE_TABLET | ORAL | 3 refills | Status: DC

## 2022-05-11 MED ORDER — FUROSEMIDE 80 MG PO TABS: 80.0000 mg | ORAL_TABLET | ORAL | 1 refills | Status: AC | PRN

## 2022-05-11 MED ORDER — ISOSORBIDE MONONITRATE ER 60 MG PO TB24: 60.0000 mg | ORAL_TABLET | Freq: Every day | ORAL | 3 refills | Status: DC

## 2022-05-11 NOTE — Patient Instructions (Addendum)
Medication Instructions:   Your physician recommends that you continue on your current medications as directed. Please refer to the Current Medication list given to you today.  *If you need a refill on your cardiac medications before your next appointment, please call your pharmacy*  Lab Work: NONE ordered at this time of appointment   If you have labs (blood work) drawn today and your tests are completely normal, you will receive your results only by: [place] (if you have MyChart) OR A paper copy in the mail If you have any lab test that is abnormal or we need to change your treatment, we will call you to review the results.  Testing/Procedures: NONE ordered at this time of appointment   Follow-Up: At [facility], you and your health needs are our priority.  As part of our continuing mission to provide you with exceptional heart care, we have created designated Provider Care Teams.  These Care Teams include your primary Cardiologist (physician) and Advanced Practice Providers (APPs -  Physician Assistants and Nurse Practitioners) who all work together to provide you with the care you need, when you need it.  Your next appointment:   6 month(s)  Provider:   Claudina Lick, MD    Other Instructions You have been referred to PREP

## 2022-05-11 NOTE — Progress Notes (Signed)
Cardiology Office Note:    Date:  05/11/2022  ID:  Mike Davenport, DOB Oct 13, 1970, MRN [id number]  PCP:  [place] Nation, MD   [place] Providers Cardiologist:  Chalmers Guest, MD     Referring MD: [place] Nation, MD   CC: Here for follow-up  History of Present Illness:    Mike Davenport is a 52 y.o. male with a hx of the following:  CAD, status post MI Hyperlipidemia Hypertension Obesity OSA on CPAP History of seizures Thyroid disease -Graves' disease Former smoker Legally blind  Cardiac catheterization in 2016 at [facility] revealed 30 to 40% LAD stenosis, had 40% plaques in proximal and mid segment of circumflex.  Culprit vessel was diagonal for NSTEMI which appeared to have ostial lesion, not amenable to percutaneous revascularization.  EF 58%.  Patient is a previous patient of Dr. Bronson Ing.  NST in 2018 showed findings of anterolateral ischemia, was medically managed.  Perfusion study in 2022 demonstrated small mild fixed defect in apical lateral and mid inferolateral segments, no reversible ischemia areas noted.  EF was noted to be 32%.  However echocardiogram demonstrated normal EF, no significant valvular abnormalities.  Patient is a very pleasant 52 year old male with past medical history as mentioned above.  Last seen by Dr. Percival Spanish on October 08, 2020.  Somewhat limited with his activities; however, he denied any acute cardiac complaints or issues.  Was doing overall very well at the time.  No medication changes were made.  Was told to follow-up in 1 year.  Today he presents for overdue 1 year follow-up. He states he is doing well from a cardiac perspective. Denies any chest pain, shortness of breath, palpitations, syncope, presyncope, dizziness, orthopnea, PND, swelling or significant weight changes, acute bleeding, or claudication. Planning on losing weight. Requesting a refill on his cardiac medications. Denies any other questions or  concerns.    Past Medical History:  Diagnosis Date   Carpal tunnel syndrome    Condylomata acuminata in male    Coronary artery disease    Diverticulitis    GERD (gastroesophageal reflux disease)    Gout    Heart attack ([place])    Hyperlipidemia    Hypertension    Hypothyroidism    Knee pain, bilateral    Legal blindness    Lumbar degenerative disc disease    Myocardial infarction ([place]) 11/2014   Obesity    OSA (obstructive sleep apnea)    Seizures ([place])    from accident; but on no meds and no more seizures since then.   Thyroid disease    Tobacco abuse    Vitamin D deficiency     Past Surgical History:  Procedure Laterality Date   BIOPSY  09/27/2016   Procedure: BIOPSY;  Surgeon: [name], MD;  [facility];  Service: Endoscopy;;  esophagus and gastric   COLONOSCOPY N/A 01/24/2017   Procedure: COLONOSCOPY;  Surgeon: [name], MD;  [facility];  Service: Endoscopy;  Laterality: N/A;  1:00pm   CORONARY ANGIOPLASTY  2016   ESOPHAGOGASTRODUODENOSCOPY N/A 09/27/2016   Procedure: ESOPHAGOGASTRODUODENOSCOPY (EGD);  Surgeon: [name], MD;  [facility];  Service: Endoscopy;  Laterality: N/A;  300   KNEE SURGERY     bilateral knee surgery for intrinsic knee disease and leg discrepency >25 years ago per pt.    PARTIAL COLECTOMY N/A 06/27/2017   Procedure: PARTIAL COLECTOMY;  Surgeon: [name], MD;  Location[facility];  Service: General;  Laterality: N/A;   SAVORY DILATION N/A 09/27/2016   Procedure: SAVORY DILATION;  Surgeon: [name], MD;  [facility];  Service: Endoscopy;  Laterality: N/A;   SHOULDER ARTHROSCOPY WITH ROTATOR CUFF REPAIR AND SUBACROMIAL DECOMPRESSION Left 12/05/2020   Procedure: SHOULDER ARTHROSCOPY WITH ROTATOR CUFF REPAIR AND SUBACROMIAL DECOMPRESSION and open distal claivcle resection;  Surgeon: [name], MD;  [facility];  Service: Orthopedics;  Laterality: Left;  with interscalene  block    Current Medications: Current Meds  Medication Sig   allopurinol (ZYLOPRIM) 100 MG tablet Take 100 mg by mouth daily.   aspirin EC 81 MG tablet Take 81 mg by mouth daily.   cholecalciferol (VITAMIN D) 25 MCG (1000 UNIT) tablet Take 1,000 Units by mouth daily.   fluticasone (FLONASE) 50 MCG/ACT nasal spray Place 2 sprays into both nostrils daily.   levothyroxine (SYNTHROID) 100 MCG tablet TAKE 1 TABLET BY MOUTH EVERY DAY BEFORE BREAKFAST   NON FORMULARY Place 1 each into the nose at bedtime. CPAP   pantoprazole (PROTONIX) 40 MG tablet TAKE 1 TABLET BY MOUTH EVERY DAY (Patient taking differently: Take 40 mg by mouth daily.)    furosemide (LASIX) 80 MG tablet Take 80 mg by mouth daily as needed for fluid or edema.    isosorbide mononitrate (IMDUR) 60 MG 24 hr tablet TAKE 1 TABLET BY MOUTH EVERY DAY (Patient taking differently: Take 60 mg by mouth daily.)   KLOR-CON M20 20 MEQ tablet Take 20 mEq by mouth every other day.   metoprolol tartrate (LOPRESSOR) 100 MG tablet Take 100 mg by mouth 2 (two) times daily.   nitroGLYCERIN (NITROSTAT) 0.4 MG SL tablet Place 1 tablet (0.4 mg total) under the tongue every 5 (five) minutes as needed for chest pain.   rosuvastatin (CRESTOR) 10 MG tablet Take 10 mg by mouth daily.     Allergies:   Other and Justicia adhatoda (malabar nut tree) [justicia adhatoda]   Social History   Socioeconomic History   Marital status: Married    Spouse name: Not on file   Number of children: Not on file   Years of education: Not on file   Highest education level: Not on file  Occupational History   Occupation: shipping and recieving    Comment: employer: [employer]   Tobacco Use   Smoking status: Former    Packs/day: 0.50    Types: Cigarettes    Quit date: 04/28/2014    Years since quitting: 8.0   Smokeless tobacco: Never   Tobacco comments:    decreased smoking  Vaping Use   Vaping Use: Never used  Substance and Sexual Activity   Alcohol use: No   Drug use:  No   Sexual activity: Yes    Birth control/protection: None  Other Topics Concern   Not on file  Social History Narrative   Pt currently lives with brother and sister in law    Social Determinants of Health   Financial Resource Strain: Not on file  Food Insecurity: Not on file  Transportation Needs: Not on file  Physical Activity: Not on file  Stress: Not on file  Social Connections: Not on file     Family History: The patient's family history includes Cancer in his maternal grandmother; High blood pressure in his mother. There is no history of Colon cancer.  ROS:   Review of Systems  Constitutional: Negative.   HENT: Negative.    Eyes: Negative.   Respiratory: Negative.  Cardiovascular: Negative.   Gastrointestinal: Negative.   Genitourinary: Negative.   Musculoskeletal: Negative.   Skin: Negative.   Neurological: Negative.   Endo/Heme/Allergies: Negative.   Psychiatric/Behavioral: Negative.      Please see the history of present illness.    All other systems reviewed and are negative.  EKGs/Labs/Other Studies Reviewed:    The following studies were reviewed today:   EKG:  EKG is ordered today.  The ekg ordered today demonstrates normal sinus rhythm, 69 bpm.  Recent Labs: 09/15/2021: ALT 11; BUN 14; Creatinine 1.2; Potassium 4.6; Sodium 138 11/03/2021: TSH 0.71  Recent Lipid Panel    Component Value Date/Time   CHOL 202 (A) 09/15/2021 0000   TRIG 102 09/15/2021 0000   HDL 50 09/15/2021 0000   CHOLHDL 4.1 04/07/2011 1456   VLDL 31 04/07/2011 1456   LDLCALC 134 09/15/2021 0000   LDLDIRECT 131 (H) 08/18/2007 2200        Physical Exam:    VS:  BP 124/81   Pulse 69   Ht '5\' 10"'$  (1.778 m)   Wt (!) 369 lb (167.4 kg)   SpO2 95%   BMI 52.95 kg/m     Wt Readings from Last 3 Encounters:  05/11/22 (!) 369 lb (167.4 kg)  12/05/20 (!) 327 lb (148.3 kg)  11/28/20 (!) 327 lb (148.3 kg)     GEN: Morbidly obese, 52 y.o. male in no acute distress HEENT:  Legally blind NECK: No JVD; No carotid bruits CARDIAC: S1/S2, RRR, no murmurs, rubs, gallops RESPIRATORY:  Clear to auscultation without rales, wheezing or rhonchi  ABDOMEN: Soft, non-tender, non-distended MUSCULOSKELETAL:  Generalized, nonpitting edema; No deformity  SKIN: Warm and dry NEUROLOGIC:  Alert and oriented x 3 PSYCHIATRIC:  Normal affect   ASSESSMENT:    1. Coronary artery disease involving native coronary artery of native heart without angina pectoris   2. Hyperlipidemia, unspecified hyperlipidemia type   3. Hypertension, unspecified type   4. OSA on CPAP   5. Morbid obesity ([place])    PLAN:    In order of problems listed above:  CAD Cardiac catheterization in 2016 at [facility] revealed 30 to 40% LAD stenosis, had 40% plaques in proximal and mid segment of circumflex.  Culprit vessel was diagonal for NSTEMI which appeared to have ostial lesion, not amenable to percutaneous revascularization.  EF 58%.  NST in 2018 showed findings of anterolateral ischemia, was medically managed.  Perfusion study in 2022 demonstrated small mild fixed defect in apical lateral and mid inferolateral segments, no reversible ischemia areas noted.  EF was noted to be 32%, although TTE demonstrated normal EF, no significant valvular abnormalities. Stable with no anginal symptoms. No indication for ischemic evaluation. Continue and refill ASA, Imdur, Metoprolol, Crestor, and NG PRN.   HLD Lipid profile 08/2021 revealed total cholesterol 202, TG 102, HDL 50, and LDL 134. Continue Rosuvastatin. Being managed by PCP. Heart healthy diet and regular cardiovascular exercise encouraged. Continue to follow with PCP.  HTN BP stable today. Continue current medication regimen. Discussed to monitor BP at home at least 2 hours after medications and sitting for 5-10 minutes. Heart healthy diet and regular cardiovascular exercise encouraged.   OSA on CPAP Encouraged continued compliance.   Morbid obesity BMI  today 52.95. Weight loss via diet and exercise encouraged. Discussed the impact being overweight would have on cardiovascular risk. Will refer to PREP program in [place].   6. Disposition: Wants to follow-up locally in [place]. Follow-up with Dr. Dellia Cloud in 6 months  or sooner if anything changes. Will refill his cardiac medications today per his request.      Medication Adjustments/Labs and Tests Ordered: Current medicines are reviewed at length with the patient today.  Concerns regarding medicines are outlined above.  Orders Placed This Encounter  Procedures   Amb Referral To Provider Referral Exercise Program (P.R.E.P)   EKG 12-Lead   Meds ordered this encounter  Medications   rosuvastatin (CRESTOR) 10 MG tablet    Sig: Take 1 tablet (10 mg total) by mouth daily.    Dispense:  90 tablet    Refill:  3   nitroGLYCERIN (NITROSTAT) 0.4 MG SL tablet    Sig: Place 1 tablet (0.4 mg total) under the tongue every 5 (five) minutes as needed for chest pain.    Dispense:  25 tablet    Refill:  3   metoprolol tartrate (LOPRESSOR) 100 MG tablet    Sig: Take 1 tablet (100 mg total) by mouth 2 (two) times daily.    Dispense:  180 tablet    Refill:  3   KLOR-CON M20 20 MEQ tablet    Sig: Take 1 tablet (20 mEq total) by mouth every other day.    Dispense:  45 tablet    Refill:  3   isosorbide mononitrate (IMDUR) 60 MG 24 hr tablet    Sig: Take 1 tablet (60 mg total) by mouth daily.    Dispense:  90 tablet    Refill:  3   furosemide (LASIX) 80 MG tablet    Sig: Take 1 tablet (80 mg total) by mouth as needed for fluid or edema.    Dispense:  30 tablet    Refill:  1    Patient Instructions  Medication Instructions:   Your physician recommends that you continue on your current medications as directed. Please refer to the Current Medication list given to you today.  *If you need a refill on your cardiac medications before your next appointment, please call your pharmacy*  Lab Work: NONE  ordered at this time of appointment   If you have labs (blood work) drawn today and your tests are completely normal, you will receive your results only by: [place] (if you have MyChart) OR A paper copy in the mail If you have any lab test that is abnormal or we need to change your treatment, we will call you to review the results.  Testing/Procedures: NONE ordered at this time of appointment   Follow-Up: At [facility], you and your health needs are our priority.  As part of our continuing mission to provide you with exceptional heart care, we have created designated Provider Care Teams.  These Care Teams include your primary Cardiologist (physician) and Advanced Practice Providers (APPs -  Physician Assistants and Nurse Practitioners) who all work together to provide you with the care you need, when you need it.  Your next appointment:   6 month(s)  Provider:   Claudina Lick, MD    Other Instructions You have been referred to PREP       Signed, Finis Bud, NP  05/12/2022 9:33 PM    [place]

## 2022-05-12 ENCOUNTER — Telehealth: Payer: Self-pay | Admitting: *Deleted

## 2022-05-12 NOTE — Telephone Encounter (Signed)
Contacted regarding PREP Class referral. Interested in participating at the [facility]. Will call back with available dates for February/March classes.

## 2022-05-19 NOTE — Progress Notes (Unsigned)
GI Office Note    Referring Provider: Cleon Gustin, MD Primary Care Physician:  Mike Nation, MD  Primary Gastroenterologist: Mike Davenport. Mike Chatters, DO; previously Mike Davenport  Chief Complaint   No chief complaint on file.   History of Present Illness   Mike Davenport is a 52 y.o. male presenting today at the request of Mike Davenport, Mike Furbish, MD for surveillance colonoscopy.  EGD June 2018: -Mild gastritis s/p biopsy (chronic gastritis, negative H. pylori) -Multiple gastric polyps -Benign esophageal biopsies -Dysphagia likely due to GERD  Colonoscopy October 2018: -7 tubular adenomas in the ascending ascending, transverse, and sigmoid colon. -Repeat in 3 years recommended.  Per review of chart patient has history of diverticulitis in 2018/2019 on multiple CT imaging without perforation or abscess.  In early January 2019 patient reported some breakthrough symptoms with GERD therefore he was switched from omeprazole to pantoprazole 40 mg daily.  He was referred to general surgery for possible colectomy which she underwent 06/27/2017.  Postop he experienced fever of unknown etiology without intra-abdominal abscess.  His incision was draining well but began to open without purulent drainage.  He was given antibiotics and drainage decreased to serosanguineous in nature.  Last seen in the office July 2019.  Doing well overall.  Having intermittent left lower quadrant pain.  Denied nausea, vomiting, fever, chills, melena, hematochezia, unintentional weight loss.  No longer having drainage from incision site and completely closed.  GERD doing well on daily PPI.  Denied any chest pain, dyspnea, syncope, constipation, or diarrhea.  Today: GERD:  Colon polyps:   Current Outpatient Medications  Medication Sig Dispense Refill   allopurinol (ZYLOPRIM) 100 MG tablet Take 100 mg by mouth daily.     aspirin EC 81 MG tablet Take 81 mg by mouth daily.     cholecalciferol (VITAMIN D)  25 MCG (1000 UNIT) tablet Take 1,000 Units by mouth daily.     fluticasone (FLONASE) 50 MCG/ACT nasal spray Place 2 sprays into both nostrils daily.     furosemide (LASIX) 80 MG tablet Take 1 tablet (80 mg total) by mouth as needed for fluid or edema. 30 tablet 1   isosorbide mononitrate (IMDUR) 60 MG 24 hr tablet Take 1 tablet (60 mg total) by mouth daily. 90 tablet 3   KLOR-CON M20 20 MEQ tablet Take 1 tablet (20 mEq total) by mouth every other day. 45 tablet 3   levothyroxine (SYNTHROID) 100 MCG tablet TAKE 1 TABLET BY MOUTH EVERY DAY BEFORE BREAKFAST 30 tablet 0   metoprolol tartrate (LOPRESSOR) 100 MG tablet Take 1 tablet (100 mg total) by mouth 2 (two) times daily. 180 tablet 3   nitroGLYCERIN (NITROSTAT) 0.4 MG SL tablet Place 1 tablet (0.4 mg total) under the tongue every 5 (five) minutes as needed for chest pain. 25 tablet 3   NON FORMULARY Place 1 each into the nose at bedtime. CPAP     pantoprazole (PROTONIX) 40 MG tablet TAKE 1 TABLET BY MOUTH EVERY DAY (Patient taking differently: Take 40 mg by mouth daily.) 90 tablet 3   rosuvastatin (CRESTOR) 10 MG tablet Take 1 tablet (10 mg total) by mouth daily. 90 tablet 3   No current facility-administered medications for this visit.    Past Medical History:  Diagnosis Date   Carpal tunnel syndrome    Condylomata acuminata in male    Coronary artery disease    Diverticulitis    GERD (gastroesophageal reflux disease)    Gout  Heart attack ([place])    Hyperlipidemia    Hypertension    Hypothyroidism    Knee pain, bilateral    Legal blindness    Lumbar degenerative disc disease    Myocardial infarction ([place]) 11/2014   Obesity    OSA (obstructive sleep apnea)    Seizures ([place])    from accident; but on no meds and no more seizures since then.   Thyroid disease    Tobacco abuse    Vitamin D deficiency     Past Surgical History:  Procedure Laterality Date   BIOPSY  09/27/2016   Procedure: BIOPSY;  Surgeon: Mike Binder, MD;   [facility];  Service: Endoscopy;;  esophagus and gastric   COLONOSCOPY N/A 01/24/2017   Procedure: COLONOSCOPY;  Surgeon: Mike Binder, MD;  [facility]: [facility];  Service: Endoscopy;  Laterality: N/A;  1:00pm   CORONARY ANGIOPLASTY  2016   ESOPHAGOGASTRODUODENOSCOPY N/A 09/27/2016   Procedure: ESOPHAGOGASTRODUODENOSCOPY (EGD);  Surgeon: Mike Binder, MD;  [facility]: [facility];  Service: Endoscopy;  Laterality: N/A;  300   KNEE SURGERY     bilateral knee surgery for intrinsic knee disease and leg discrepency >25 years ago per pt.    PARTIAL COLECTOMY N/A 06/27/2017   Procedure: PARTIAL COLECTOMY;  Surgeon: Mike Signs, MD;  Location: [facility];  Service: General;  Laterality: N/A;   SAVORY DILATION N/A 09/27/2016   Procedure: SAVORY DILATION;  Surgeon: Mike Binder, MD;  Location: [facility];  Service: Endoscopy;  Laterality: N/A;   SHOULDER ARTHROSCOPY WITH ROTATOR CUFF REPAIR AND SUBACROMIAL DECOMPRESSION Left 12/05/2020   Procedure: SHOULDER ARTHROSCOPY WITH ROTATOR CUFF REPAIR AND SUBACROMIAL DECOMPRESSION and open distal claivcle resection;  Surgeon: Mike Cedars, MD;  Location: [facility];  Service: Orthopedics;  Laterality: Left;  with interscalene block    Family History  Problem Relation Age of Onset   High blood pressure Mother    Cancer Maternal Grandmother    Colon cancer Neg Hx     Allergies as of 05/20/2022 - Review Complete 05/11/2022  Allergen Reaction Noted   Other Anaphylaxis 06/07/2011   Justicia adhatoda (malabar nut tree) [justicia adhatoda]  02/24/2020    Social History   Socioeconomic History   Marital status: Married    Spouse name: Not on file   Number of children: Not on file   Years of education: Not on file   Highest education level: Not on file  Occupational History   Occupation: shipping and recieving    Comment: employer: [employer]   Tobacco Use   Smoking status: Former    Packs/day: 0.50    Types: Cigarettes    Quit date:  04/28/2014    Years since quitting: 8.0   Smokeless tobacco: Never   Tobacco comments:    decreased smoking  Vaping Use   Vaping Use: Never used  Substance and Sexual Activity   Alcohol use: No   Drug use: No   Sexual activity: Yes    Birth control/protection: None  Other Topics Concern   Not on file  Social History Narrative   Pt currently lives with brother and sister in law    Social Determinants of Health   Financial Resource Strain: Not on file  Food Insecurity: Not on file  Transportation Needs: Not on file  Physical Activity: Not on file  Stress: Not on file  Social Connections: Not on file  Intimate Partner Violence: Not on file     Review of  Systems   Gen: Denies any fever, chills, fatigue, weight loss, lack of appetite.  CV: Denies chest pain, heart palpitations, peripheral edema, syncope.  Resp: Denies shortness of breath at rest or with exertion. Denies wheezing or cough.  GI: see HPI GU : Denies urinary burning, urinary frequency, urinary hesitancy MS: Denies joint pain, muscle weakness, cramps, or limitation of movement.  Derm: Denies rash, itching, dry skin Psych: Denies depression, anxiety, memory loss, and confusion Heme: Denies bruising, bleeding, and enlarged lymph nodes.   Physical Exam   There were no vitals taken for this visit.  General:   Alert and oriented. Pleasant and cooperative. Well-nourished and well-developed.  Head:  Normocephalic and atraumatic. Eyes:  Without icterus, sclera clear and conjunctiva pink.  Ears:  Normal auditory acuity. Mouth:  No deformity or lesions, oral mucosa pink.  Lungs:  Clear to auscultation bilaterally. No wheezes, rales, or rhonchi. No distress.  Heart:  S1, S2 present without murmurs appreciated.  Abdomen:  +BS, soft, non-tender and non-distended. No HSM noted. No guarding or rebound. No masses appreciated.  Rectal:  Deferred *** Msk:  Symmetrical without gross deformities. Normal posture. Extremities:   Without edema. Neurologic:  Alert and  oriented x4;  grossly normal neurologically. Skin:  Intact without significant lesions or rashes. Psych:  Alert and cooperative. Normal mood and affect.   Assessment   Mike Davenport is a 52 y.o. male with a history of GERD, diverticulitis s/p partial colectomy, colon polyps, CAD, HLD, HTN, hypothyroidism, legally blind, MI in 2016, sleep apnea, tobacco abuse, vitamin D deficiency*** presenting today for office visit prior to scheduling surveillance colonoscopy.  History of adenomatous colon polyps:  GERD:   PLAN   ***    Venetia Night, MSN, FNP-BC, AGACNP-BC [facility]

## 2022-05-20 ENCOUNTER — Encounter (INDEPENDENT_AMBULATORY_CARE_PROVIDER_SITE_OTHER): Payer: Self-pay | Admitting: *Deleted

## 2022-05-20 ENCOUNTER — Ambulatory Visit (INDEPENDENT_AMBULATORY_CARE_PROVIDER_SITE_OTHER): Payer: 59 | Admitting: Gastroenterology

## 2022-05-20 ENCOUNTER — Telehealth: Payer: Self-pay | Admitting: *Deleted

## 2022-05-20 ENCOUNTER — Encounter: Payer: Self-pay | Admitting: Gastroenterology

## 2022-05-20 ENCOUNTER — Encounter: Payer: Self-pay | Admitting: *Deleted

## 2022-05-20 VITALS — BP 132/89 | HR 58 | Temp 97.7°F | Ht 70.0 in | Wt 369.5 lb

## 2022-05-20 DIAGNOSIS — Z86010 Personal history of colonic polyps: Secondary | ICD-10-CM | POA: Diagnosis not present

## 2022-05-20 DIAGNOSIS — K219 Gastro-esophageal reflux disease without esophagitis: Secondary | ICD-10-CM | POA: Diagnosis not present

## 2022-05-20 NOTE — Telephone Encounter (Signed)
PA approved via [facility]. Auth# [id number], DOS: Jun 21, 2022 - Sep 19, 2022

## 2022-05-20 NOTE — Patient Instructions (Signed)
We are scheduling you for colonoscopy in the near future with Dr. Abbey Chatters.  You received separate written instructions regarding your prep  Continue pantoprazole 40 mg daily for your reflux. Follow a GERD diet:  Avoid fried, fatty, greasy, spicy, citrus foods. Avoid caffeine and carbonated beverages. Avoid chocolate. Try eating 4-6 small meals a day rather than 3 large meals. Do not eat within 3 hours of laying down. Prop head of bed up on wood or bricks to create a 6 inch incline.   It was a pleasure to meet you today! I want to create trusting relationships with patients. If you receive a survey regarding your visit,  I greatly appreciate you taking time to fill this out on paper or through your MyChart. I value your feedback.  Venetia Night, MSN, FNP-BC, AGACNP-BC [facility]

## 2022-06-07 ENCOUNTER — Telehealth: Payer: Self-pay | Admitting: *Deleted

## 2022-06-07 NOTE — Telephone Encounter (Signed)
Pt left vm wanting to confirm the date of his colonoscopy.   Pt informed that colonoscopy is scheduled for 06/21/22 and he has a pre-op date on 06/17/22 at 2:45 pm. Pt verbalized understanding.

## 2022-06-10 DIAGNOSIS — J449 Chronic obstructive pulmonary disease, unspecified: Secondary | ICD-10-CM | POA: Diagnosis not present

## 2022-06-10 DIAGNOSIS — I270 Primary pulmonary hypertension: Secondary | ICD-10-CM | POA: Diagnosis not present

## 2022-06-10 DIAGNOSIS — G4733 Obstructive sleep apnea (adult) (pediatric): Secondary | ICD-10-CM | POA: Diagnosis not present

## 2022-06-10 DIAGNOSIS — E039 Hypothyroidism, unspecified: Secondary | ICD-10-CM | POA: Diagnosis not present

## 2022-06-14 ENCOUNTER — Telehealth: Payer: Self-pay | Admitting: *Deleted

## 2022-06-14 NOTE — Telephone Encounter (Signed)
Contacted regarding PREP Class beginning 06/29/2022. Patient stated he has been attending another exercise program 3 days/week and will not require PREP. I asked him to contact me if I could be af any further assistance.

## 2022-06-16 NOTE — Patient Instructions (Signed)
Mike Davenport  06/16/2022     @[email or web address]$ @   Your procedure is scheduled on  06/21/2022.   Report to [facility] at  [place].   Call this number if you have problems the morning of surgery:  [phone]  If you experience any cold or flu symptoms such as cough, fever, chills, shortness of breath, etc. between now and your scheduled surgery, please notify us at the above number.   Remember:  Follow the diet and prep instructions given to you by the office.     Take these medicines the morning of surgery with A SIP OF WATER          allopurinol, imdur, levothyroxine, metoprolol, pantoprazole.     Do not wear jewelry, make-up or nail polish.  Do not wear lotions, powders, or perfumes, or deodorant.  Do not shave 48 hours prior to surgery.  Men may shave face and neck.  Do not bring valuables to the hospital.  [facility] is not responsible for any belongings or valuables.  Contacts, dentures or bridgework may not be worn into surgery.  Leave your suitcase in the car.  After surgery it may be brought to your room.  For patients admitted to the hospital, discharge time will be determined by your treatment team.  Patients discharged the day of surgery will not be allowed to drive home and must have someone with them for 24 hours.   Special instructions:   DO NOT smoke tobacco or vape for 24 hours before your procedure.  Please read over the following fact sheets that you were given. Anesthesia Post-op Instructions and Care and Recovery After Surgery      Colonoscopy, Adult, Care After The following information offers guidance on how to care for yourself after your procedure. Your health care provider may also give you more specific instructions. If you have problems or questions, contact your health care provider. What can I expect after the procedure? After the procedure, it is common to have: A small amount of blood in your stool for 24 hours after  the procedure. Some gas. Mild cramping or bloating of your abdomen. Follow these instructions at home: Eating and drinking  Drink enough fluid to keep your urine pale yellow. Follow instructions from your health care provider about eating or drinking restrictions. Resume your normal diet as told by your health care provider. Avoid heavy or fried foods that are hard to digest. Activity Rest as told by your health care provider. Avoid sitting for a long time without moving. Get up to take short walks every 1-2 hours. This is important to improve blood flow and breathing. Ask for help if you feel weak or unsteady. Return to your normal activities as told by your health care provider. Ask your health care provider what activities are safe for you. Managing cramping and bloating  Try walking around when you have cramps or feel bloated. If directed, apply heat to your abdomen as told by your health care provider. Use the heat source that your health care provider recommends, such as a moist heat pack or a heating pad. Place a towel between your skin and the heat source. Leave the heat on for 20-30 minutes. Remove the heat if your skin turns bright red. This is especially important if you are unable to feel pain, heat, or cold. You have a greater risk of getting burned. General instructions If you were given a sedative during the procedure,  it can affect you for several hours. Do not drive or operate machinery until your health care provider says that it is safe. For the first 24 hours after the procedure: Do not sign important documents. Do not drink alcohol. Do your regular daily activities at a slower pace than normal. Eat soft foods that are easy to digest. Take over-the-counter and prescription medicines only as told by your health care provider. Keep all follow-up visits. This is important. Contact a health care provider if: You have blood in your stool 2-3 days after the procedure. Get  help right away if: You have more than a small spotting of blood in your stool. You have large blood clots in your stool. You have swelling of your abdomen. You have nausea or vomiting. You have a fever. You have increasing pain in your abdomen that is not relieved with medicine. These symptoms may be an emergency. Get help right away. Call 911. Do not wait to see if the symptoms will go away. Do not drive yourself to the hospital. Summary After the procedure, it is common to have a small amount of blood in your stool. You may also have mild cramping and bloating of your abdomen. If you were given a sedative during the procedure, it can affect you for several hours. Do not drive or operate machinery until your health care provider says that it is safe. Get help right away if you have a lot of blood in your stool, nausea or vomiting, a fever, or increased pain in your abdomen. This information is not intended to replace advice given to you by your health care provider. Make sure you discuss any questions you have with your health care provider. Document Revised: 12/03/2020 Document Reviewed: 12/03/2020 Elsevier Patient Education  [place] After The following information offers guidance on how to care for yourself after your procedure. Your health care provider may also give you more specific instructions. If you have problems or questions, contact your health care provider. What can I expect after the procedure? After the procedure, it is common to have: Tiredness. Little or no memory about what happened during or after the procedure. Impaired judgment when it comes to making decisions. Nausea or vomiting. Some trouble with balance. Follow these instructions at home: For the time period you were told by your health care provider:  Rest. Do not participate in activities where you could fall or become injured. Do not drive or use machinery. Do  not drink alcohol. Do not take sleeping pills or medicines that cause drowsiness. Do not make important decisions or sign legal documents. Do not take care of children on your own. Medicines Take over-the-counter and prescription medicines only as told by your health care provider. If you were prescribed antibiotics, take them as told by your health care provider. Do not stop using the antibiotic even if you start to feel better. Eating and drinking Follow instructions from your health care provider about what you may eat and drink. Drink enough fluid to keep your urine pale yellow. If you vomit: Drink clear fluids slowly and in small amounts as you are able. Clear fluids include water, ice chips, low-calorie sports drinks, and fruit juice that has water added to it (diluted fruit juice). Eat light and bland foods in small amounts as you are able. These foods include bananas, applesauce, rice, lean meats, toast, and crackers. General instructions  Have a responsible adult stay with you for the time  you are told. It is important to have someone help care for you until you are awake and alert. If you have sleep apnea, surgery and some medicines can increase your risk for breathing problems. Follow instructions from your health care provider about wearing your sleep device: When you are sleeping. This includes during daytime naps. While taking prescription pain medicines, sleeping medicines, or medicines that make you drowsy. Do not use any products that contain nicotine or tobacco. These products include cigarettes, chewing tobacco, and vaping devices, such as e-cigarettes. If you need help quitting, ask your health care provider. Contact a health care provider if: You feel nauseous or vomit every time you eat or drink. You feel light-headed. You are still sleepy or having trouble with balance after 24 hours. You get a rash. You have a fever. You have redness or swelling around the IV  site. Get help right away if: You have trouble breathing. You have new confusion after you get home. These symptoms may be an emergency. Get help right away. Call 911. Do not wait to see if the symptoms will go away. Do not drive yourself to the hospital. This information is not intended to replace advice given to you by your health care provider. Make sure you discuss any questions you have with your health care provider. Document Revised: 09/07/2021 Document Reviewed: 09/07/2021 Elsevier Patient Education  [place].

## 2022-06-17 ENCOUNTER — Encounter (HOSPITAL_COMMUNITY)
Admission: RE | Admit: 2022-06-17 | Discharge: 2022-06-17 | Disposition: A | Discharge status: Home / Self Care | Payer: 59 | Source: Ambulatory Visit | Attending: Internal Medicine | Admitting: Internal Medicine

## 2022-06-17 DIAGNOSIS — Z79899 Other long term (current) drug therapy: Secondary | ICD-10-CM | POA: Insufficient documentation

## 2022-06-17 LAB — BASIC METABOLIC PANEL
Anion gap: 8 (ref 5–15)
BUN: 15 mg/dL (ref 6–20)
CO2: 22 mmol/L (ref 22–32)
Calcium: 8.6 mg/dL — ABNORMAL LOW (ref 8.9–10.3)
Chloride: 107 mmol/L (ref 98–111)
Creatinine, Ser: 1.24 mg/dL (ref 0.61–1.24)
GFR, Estimated: >60 mL/min (ref >60)
Glucose, Bld: 93 mg/dL (ref 70–99)
Potassium: 3.8 mmol/L (ref 3.5–5.1)
Sodium: 137 mmol/L (ref 135–145)

## 2022-06-21 ENCOUNTER — Encounter (HOSPITAL_COMMUNITY): Payer: Self-pay

## 2022-06-21 ENCOUNTER — Encounter (HOSPITAL_COMMUNITY)
Admission: RE | Disposition: A | Discharge status: Home / Self Care | Payer: Self-pay | Source: Ambulatory Visit | Attending: Internal Medicine

## 2022-06-21 ENCOUNTER — Ambulatory Visit (HOSPITAL_COMMUNITY): Payer: 59 | Admitting: Anesthesiology

## 2022-06-21 ENCOUNTER — Ambulatory Visit (HOSPITAL_BASED_OUTPATIENT_CLINIC_OR_DEPARTMENT_OTHER): Payer: 59 | Admitting: Anesthesiology

## 2022-06-21 ENCOUNTER — Other Ambulatory Visit: Payer: Self-pay

## 2022-06-21 ENCOUNTER — Ambulatory Visit (HOSPITAL_COMMUNITY)
Admission: RE | Admit: 2022-06-21 | Discharge: 2022-06-21 | Disposition: A | Discharge status: Home / Self Care | Payer: 59 | Source: Ambulatory Visit | Attending: Internal Medicine | Admitting: Internal Medicine

## 2022-06-21 DIAGNOSIS — G4733 Obstructive sleep apnea (adult) (pediatric): Secondary | ICD-10-CM | POA: Diagnosis not present

## 2022-06-21 DIAGNOSIS — Z6841 Body Mass Index (BMI) 40.0 and over, adult: Secondary | ICD-10-CM | POA: Insufficient documentation

## 2022-06-21 DIAGNOSIS — I10 Essential (primary) hypertension: Secondary | ICD-10-CM | POA: Diagnosis not present

## 2022-06-21 DIAGNOSIS — D124 Benign neoplasm of descending colon: Secondary | ICD-10-CM

## 2022-06-21 DIAGNOSIS — E785 Hyperlipidemia, unspecified: Secondary | ICD-10-CM | POA: Insufficient documentation

## 2022-06-21 DIAGNOSIS — Z09 Encounter for follow-up examination after completed treatment for conditions other than malignant neoplasm: Secondary | ICD-10-CM

## 2022-06-21 DIAGNOSIS — I2510 Atherosclerotic heart disease of native coronary artery without angina pectoris: Secondary | ICD-10-CM | POA: Insufficient documentation

## 2022-06-21 DIAGNOSIS — D125 Benign neoplasm of sigmoid colon: Secondary | ICD-10-CM | POA: Diagnosis not present

## 2022-06-21 DIAGNOSIS — K219 Gastro-esophageal reflux disease without esophagitis: Secondary | ICD-10-CM | POA: Diagnosis not present

## 2022-06-21 DIAGNOSIS — K5730 Diverticulosis of large intestine without perforation or abscess without bleeding: Secondary | ICD-10-CM | POA: Diagnosis not present

## 2022-06-21 DIAGNOSIS — E039 Hypothyroidism, unspecified: Secondary | ICD-10-CM | POA: Diagnosis not present

## 2022-06-21 DIAGNOSIS — K635 Polyp of colon: Secondary | ICD-10-CM | POA: Diagnosis not present

## 2022-06-21 DIAGNOSIS — Z87891 Personal history of nicotine dependence: Secondary | ICD-10-CM | POA: Insufficient documentation

## 2022-06-21 DIAGNOSIS — Z980 Intestinal bypass and anastomosis status: Secondary | ICD-10-CM | POA: Insufficient documentation

## 2022-06-21 DIAGNOSIS — Z86010 Personal history of colonic polyps: Secondary | ICD-10-CM | POA: Insufficient documentation

## 2022-06-21 DIAGNOSIS — Z1211 Encounter for screening for malignant neoplasm of colon: Secondary | ICD-10-CM | POA: Diagnosis not present

## 2022-06-21 DIAGNOSIS — I252 Old myocardial infarction: Secondary | ICD-10-CM | POA: Insufficient documentation

## 2022-06-21 HISTORY — PX: COLONOSCOPY WITH PROPOFOL: SHX5780

## 2022-06-21 HISTORY — PX: POLYPECTOMY: SHX5525

## 2022-06-21 SURGERY — COLONOSCOPY WITH PROPOFOL
Anesthesia: General

## 2022-06-21 MED ORDER — LIDOCAINE HCL 1 % IJ SOLN
INTRAMUSCULAR | Status: DC | PRN
  Administered 2022-06-21: 50 mg via INTRADERMAL

## 2022-06-21 MED ORDER — PROPOFOL 500 MG/50ML IV EMUL
INTRAVENOUS | Status: DC | PRN
  Administered 2022-06-21: 150 ug/kg/min via INTRAVENOUS

## 2022-06-21 MED ORDER — LACTATED RINGERS IV SOLN: INTRAVENOUS | Status: DC

## 2022-06-21 MED ORDER — PROPOFOL 10 MG/ML IV BOLUS
INTRAVENOUS | Status: DC | PRN
  Administered 2022-06-21 (×2): 50 mg via INTRAVENOUS

## 2022-06-21 NOTE — Discharge Instructions (Addendum)
  Colonoscopy Discharge Instructions  Read the instructions outlined below and refer to this sheet in the next few weeks. These discharge instructions provide you with general information on caring for yourself after you leave the hospital. Your doctor may also give you specific instructions. While your treatment has been planned according to the most current medical practices available, unavoidable complications occasionally occur.   ACTIVITY You may resume your regular activity, but move at a slower pace for the next 24 hours.  Take frequent rest periods for the next 24 hours.  Walking will help get rid of the air and reduce the bloated feeling in your belly (abdomen).  No driving for 24 hours (because of the medicine (anesthesia) used during the test).   Do not sign any important legal documents or operate any machinery for 24 hours (because of the anesthesia used during the test).  NUTRITION Drink plenty of fluids.  You may resume your normal diet as instructed by your doctor.  Begin with a light meal and progress to your normal diet. Heavy or fried foods are harder to digest and may make you feel sick to your stomach (nauseated).  Avoid alcoholic beverages for 24 hours or as instructed.  MEDICATIONS You may resume your normal medications unless your doctor tells you otherwise.  WHAT YOU CAN EXPECT TODAY Some feelings of bloating in the abdomen.  Passage of more gas than usual.  Spotting of blood in your stool or on the toilet paper.  IF YOU HAD POLYPS REMOVED DURING THE COLONOSCOPY: No aspirin products for 7 days or as instructed.  No alcohol for 7 days or as instructed.  Eat a soft diet for the next 24 hours.  FINDING OUT THE RESULTS OF YOUR TEST Not all test results are available during your visit. If your test results are not back during the visit, make an appointment with your caregiver to find out the results. Do not assume everything is normal if you have not heard from your  caregiver or the medical facility. It is important for you to follow up on all of your test results.  SEEK IMMEDIATE MEDICAL ATTENTION IF: You have more than a spotting of blood in your stool.  Your belly is swollen (abdominal distention).  You are nauseated or vomiting.  You have a temperature over 101.  You have abdominal pain or discomfort that is severe or gets worse throughout the day.   Your colonoscopy revealed 2 polyp(s) which I removed successfully. Await pathology results, my office will contact you. I recommend repeating colonoscopy in 5 years for surveillance purposes.   Previous surgical site in your colon appeared healthy.   You also have diverticulosis and internal hemorrhoids. I would recommend increasing fiber in your diet or adding OTC Benefiber/Metamucil. Be sure to drink at least 4 to 6 glasses of water daily. Follow-up with GI as needed.   I hope you have a great rest of your week!  [name], D.O. Gastroenterology and Hepatology [facility]

## 2022-06-21 NOTE — Op Note (Signed)
[facility] Patient Name: Mike Davenport Procedure Date: 06/21/2022 7:14 AM MRN: [id number] Date of Birth: 06/06/1970 Attending MD: [name] , [place], [id number] CSN: [id number] Age: 52 Admit Type: Outpatient Procedure:                Colonoscopy Indications:              Surveillance: Personal history of adenomatous                            polyps on last colonoscopy > 5 years ago Providers:                [name], DO, Caprice Kluver, Illene Labrador Referring MD:              Medicines:                See the Anesthesia note for documentation of the                            administered medications Complications:            No immediate complications. Estimated Blood Loss:     Estimated blood loss was minimal. Procedure:                Pre-Anesthesia Assessment:                           - The anesthesia plan was to use monitored                            anesthesia care (MAC).                           After obtaining informed consent, the colonoscope                            was passed under direct vision. Throughout the                            procedure, the patient's blood pressure, pulse, and                            oxygen saturations were monitored continuously. The                            [id number]-[id number]) scope was introduced through                            the anus and advanced to the the cecum, identified                            by appendiceal orifice and ileocecal valve. The                            colonoscopy was performed without difficulty. The  patient tolerated the procedure well. The quality                            of the bowel preparation was evaluated using the                            BBPS [facility] Bowel Preparation Scale) with scores                            of: Right Colon = 3, Transverse Colon = 3 and Left                            Colon = 3 (entire mucosa seen well with no  residual                            staining, small fragments of stool or opaque                            liquid). The total BBPS score equals 9. Scope In: 7:39:43 AM Scope Out: 7:50:19 AM Scope Withdrawal Time: 0 hours 8 minutes 22 seconds  Total Procedure Duration: 0 hours 10 minutes 36 seconds  Findings:      The perianal and digital rectal examinations were normal.      Many large-mouthed and small-mouthed diverticula were found in the       entire colon.      There was evidence of a prior side-to-side colo-colonic anastomosis in       the sigmoid colon. This was patent and was characterized by healthy       appearing mucosa. The anastomosis was traversed.      Two sessile polyps were found in the sigmoid colon and descending colon.       The polyps were 4 to 6 mm in size. These polyps were removed with a cold       snare. Resection and retrieval were complete.      The exam was otherwise without abnormality. Impression:               - Diverticulosis in the entire examined colon.                           - Patent side-to-side colo-colonic anastomosis,                            characterized by healthy appearing mucosa.                           - Two 4 to 6 mm polyps in the sigmoid colon and in                            the descending colon, removed with a cold snare.                            Resected and retrieved.                           -  The examination was otherwise normal. Moderate Sedation:      Per Anesthesia Care Recommendation:           - Patient has a contact number available for                            emergencies. The signs and symptoms of potential                            delayed complications were discussed with the                            patient. Return to normal activities tomorrow.                            Written discharge instructions were provided to the                            patient.                           - Resume previous  diet.                           - Continue present medications.                           - Await pathology results.                           - Repeat colonoscopy in 5 years for surveillance                            and history of polyps prior                           - Return to GI clinic PRN. Procedure Code(s):        --- Professional ---                           [id number], Colonoscopy, flexible; with removal of                            tumor(s), polyp(s), or other lesion(s) by snare                            technique Diagnosis Code(s):        --- Professional ---                           Z86.010, Personal history of colonic polyps                           Z98.0, Intestinal bypass and anastomosis status                           D12.5, Benign neoplasm of sigmoid colon  D12.4, Benign neoplasm of descending colon                           K57.30, Diverticulosis of large intestine without                            perforation or abscess without bleeding CPT copyright 2022 American Medical Association. All rights reserved. The codes documented in this report are preliminary and upon coder review may  be revised to meet current compliance requirements. [name], DO Pratt Abbey Chatters, DO 06/21/2022 7:55:15 AM This report has been signed electronically. Number of Addenda: 0

## 2022-06-21 NOTE — H&P (Signed)
Primary Care Physician:  [facility] Nation, MD Primary Gastroenterologist:  Dr. Abbey Chatters  Pre-Procedure History & Physical: HPI:  Mike Davenport is a 52 y.o. male is here for a colonoscopy to be performed for surveillance purposes, personal history of adenomatous colon polyps in 2018  Past Medical History:  Diagnosis Date   Carpal tunnel syndrome    Condylomata acuminata in male    Coronary artery disease    Diverticulitis    GERD (gastroesophageal reflux disease)    Gout    Heart attack ([place])    Hyperlipidemia    Hypertension    Hypothyroidism    Knee pain, bilateral    Legal blindness    Lumbar degenerative disc disease    Myocardial infarction ([place]) 11/2014   Obesity    OSA (obstructive sleep apnea)    Seizures ([place])    from accident; but on no meds and no more seizures since then.   Thyroid disease    Tobacco abuse    Vitamin D deficiency     Past Surgical History:  Procedure Laterality Date   BIOPSY  09/27/2016   Procedure: BIOPSY;  Surgeon: [name], MD;  Location: [facility];  Service: Endoscopy;;  esophagus and gastric   COLONOSCOPY N/A 01/24/2017   Procedure: COLONOSCOPY;  Surgeon: [name], MD;  Location: [facility];  Service: Endoscopy;  Laterality: N/A;  1:00pm   CORONARY ANGIOPLASTY  2016   ESOPHAGOGASTRODUODENOSCOPY N/A 09/27/2016   Procedure: ESOPHAGOGASTRODUODENOSCOPY (EGD);  Surgeon: [name], MD;  Location: [facility];  Service: Endoscopy;  Laterality: N/A;  300   KNEE SURGERY     bilateral knee surgery for intrinsic knee disease and leg discrepency >25 years ago per pt.    PARTIAL COLECTOMY N/A 06/27/2017   Procedure: PARTIAL COLECTOMY;  Surgeon: [name], MD;  Location: [facility];  Service: General;  Laterality: N/A;   SAVORY DILATION N/A 09/27/2016   Procedure: SAVORY DILATION;  Surgeon: [name], MD;  Location: [facility];  Service: Endoscopy;  Laterality: N/A;   SHOULDER ARTHROSCOPY WITH ROTATOR CUFF REPAIR AND  SUBACROMIAL DECOMPRESSION Left 12/05/2020   Procedure: SHOULDER ARTHROSCOPY WITH ROTATOR CUFF REPAIR AND SUBACROMIAL DECOMPRESSION and open distal claivcle resection;  Surgeon: [name], MD;  Location: [facility];  Service: Orthopedics;  Laterality: Left;  with interscalene block    Prior to Admission medications   Medication Sig Start Date End Date Taking? Authorizing Provider  allopurinol (ZYLOPRIM) 100 MG tablet Take 100 mg by mouth daily.   Yes [provider]  isosorbide mononitrate (IMDUR) 60 MG 24 hr tablet Take 1 tablet (60 mg total) by mouth daily. 05/11/22  Yes Finis Bud, NP  levothyroxine (SYNTHROID) 50 MCG tablet Take 50 mcg by mouth daily. 06/11/22  Yes [provider]  metoprolol tartrate (LOPRESSOR) 100 MG tablet Take 1 tablet (100 mg total) by mouth 2 (two) times daily. 05/11/22  Yes Finis Bud, NP  pantoprazole (PROTONIX) 40 MG tablet TAKE 1 TABLET BY MOUTH EVERY DAY Patient taking differently: Take 40 mg by mouth daily. 03/19/19  Yes Mahala Menghini, PA-C  rosuvastatin (CRESTOR) 10 MG tablet Take 1 tablet (10 mg total) by mouth daily. 05/11/22  Yes Finis Bud, NP  aspirin EC 81 MG tablet Take 81 mg by mouth daily.    [provider]  cholecalciferol (VITAMIN D) 25 MCG (1000 UNIT) tablet Take 1,000 Units by mouth daily.    [provider]  fluticasone (FLONASE) 50  MCG/ACT nasal spray Place 2 sprays into both nostrils daily. 01/03/20   [provider]  furosemide (LASIX) 80 MG tablet Take 1 tablet (80 mg total) by mouth as needed for fluid or edema. 05/11/22   Finis Bud, NP  KLOR-CON M20 20 MEQ tablet Take 1 tablet (20 mEq total) by mouth every other day. 05/11/22   Finis Bud, NP  nitroGLYCERIN (NITROSTAT) 0.4 MG SL tablet Place 1 tablet (0.4 mg total) under the tongue every 5 (five) minutes as needed for chest pain. 05/11/22   Finis Bud, NP  NON FORMULARY Place 1 each into the nose at bedtime. CPAP    [provider]    Allergies as of 05/20/2022 - Review Complete 05/20/2022  Allergen Reaction Noted   Other Anaphylaxis 06/07/2011   Justicia adhatoda (malabar nut tree) [justicia adhatoda]  02/24/2020    Family History  Problem Relation Age of Onset   High blood pressure Mother    Cancer Maternal Grandmother    Colon cancer Neg Hx     Social History   Socioeconomic History   Marital status: Married    Spouse name: Not on file   Number of children: Not on file   Years of education: Not on file   Highest education level: Not on file  Occupational History   Occupation: shipping and recieving    Comment: employer: [employer]   Tobacco Use   Smoking status: Former    Packs/day: 0.50    Types: Cigarettes    Quit date: 04/28/2014    Years since quitting: 8.1   Smokeless tobacco: Never   Tobacco comments:    decreased smoking  Vaping Use   Vaping Use: Never used  Substance and Sexual Activity   Alcohol use: No   Drug use: No   Sexual activity: Yes    Birth control/protection: None  Other Topics Concern   Not on file  Social History Narrative   Pt currently lives with brother and sister in law    Social Determinants of Health   Financial Resource Strain: Not on file  Food Insecurity: Not on file  Transportation Needs: Not on file  Physical Activity: Not on file  Stress: Not on file  Social Connections: Not on file  Intimate Partner Violence: Not on file    Review of Systems: See HPI, otherwise negative ROS  Physical Exam: Vital signs in last 24 hours: Temp:  [98.6 F (37 C)] 98.6 F (37 C) (02/26 0651) Pulse Rate:  [69] 69 (02/26 0651) BP: (134)/(95) 134/95 (02/26 0651) SpO2:  [98 %] 98 % (02/26 0651) Weight:  [166.5 kg] 166.5 kg (02/26 0651)   General:   Alert,  Well-developed, well-nourished, pleasant and cooperative in NAD Head:  Normocephalic and atraumatic. Eyes:  Sclera clear, no icterus.   Conjunctiva pink. Ears:  Normal auditory acuity. Nose:  No  deformity, discharge,  or lesions. Msk:  Symmetrical without gross deformities. Normal posture. Extremities:  Without clubbing or edema. Neurologic:  Alert and  oriented x4;  grossly normal neurologically. Skin:  Intact without significant lesions or rashes. Psych:  Alert and cooperative. Normal mood and affect.  Impression/Plan: Mike Davenport is here for a colonoscopy to be performed for surveillance purposes, personal history of adenomatous colon polyps in 2018  The risks of the procedure including infection, bleed, or perforation as well as benefits, limitations, alternatives and imponderables have been reviewed with the patient. Questions have been answered. All parties agreeable.

## 2022-06-21 NOTE — Anesthesia Postprocedure Evaluation (Signed)
Anesthesia Post Note  Patient: Mike Davenport  Procedure(s) Performed: COLONOSCOPY WITH PROPOFOL POLYPECTOMY  Patient location during evaluation: Short Stay Anesthesia Type: General Level of consciousness: awake and alert Pain management: pain level controlled Vital Signs Assessment: post-procedure vital signs reviewed and stable Respiratory status: spontaneous breathing Cardiovascular status: blood pressure returned to baseline and stable Postop Assessment: no apparent nausea or vomiting Anesthetic complications: no   No notable events documented.   Last Vitals:  Vitals:   06/21/22 0651  BP: (!) 134/95  Pulse: 69  Temp: 37 C  SpO2: 98%    Last Pain:  Vitals:   06/21/22 0732  TempSrc:   PainSc: 8                  Charlesia Canaday

## 2022-06-21 NOTE — Anesthesia Preprocedure Evaluation (Signed)
Anesthesia Evaluation  Patient identified by MRN, date of birth, ID band Patient awake    Reviewed: Allergy & Precautions, H&P , NPO status , Patient's Chart, lab work & pertinent test results  Airway Mallampati: III  TM Distance: >3 FB Neck ROM: Full    Dental  (+) Missing, Dental Advisory Given   Pulmonary sleep apnea and Continuous Positive Airway Pressure Ventilation , former smoker   Pulmonary exam normal breath sounds clear to auscultation       Cardiovascular Exercise Tolerance: Good hypertension, Pt. on medications + CAD and + Past MI  Normal cardiovascular exam Rhythm:Regular Rate:Normal     Neuro/Psych  Headaches, Seizures - (denied h/o seizures), Well Controlled,  PSYCHIATRIC DISORDERS  Depression     Neuromuscular disease    GI/Hepatic Neg liver ROS,GERD  Medicated,,  Endo/Other  Hypothyroidism  Morbid obesity  Renal/GU negative Renal ROS  negative genitourinary   Musculoskeletal  (+) Arthritis , Osteoarthritis,    Abdominal   Peds negative pediatric ROS (+)  Hematology negative hematology ROS (+)   Anesthesia Other Findings 1. CAD Cardiac catheterization in 2016 at [facility] revealed 30 to 40% LAD stenosis, had 40% plaques in proximal and mid segment of circumflex.  Culprit vessel was diagonal for NSTEMI which appeared to have ostial lesion, not amenable to percutaneous revascularization.  EF 58%.  NST in 2018 showed findings of anterolateral ischemia, was medically managed.  Perfusion study in 2022 demonstrated small mild fixed defect in apical lateral and mid inferolateral segments, no reversible ischemia areas noted.  EF was noted to be 32%, although TTE demonstrated normal EF, no significant valvular abnormalities. Stable with no anginal symptoms. No indication for ischemic evaluation. Continue and refill ASA, Imdur, Metoprolol, Crestor, and NG PRN.    2. HLD Lipid profile 08/2021 revealed total  cholesterol 202, TG 102, HDL 50, and LDL 134. Continue Rosuvastatin. Being managed by PCP. Heart healthy diet and regular cardiovascular exercise encouraged. Continue to follow with PCP.   3. HTN BP stable today. Continue current medication regimen. Discussed to monitor BP at home at least 2 hours after medications and sitting for 5-10 minutes. Heart healthy diet and regular cardiovascular exercise encouraged.    4. OSA on CPAP Encouraged continued compliance.    5. Morbid obesity BMI today 52.95. Weight loss via diet and exercise encouraged. Discussed the impact being overweight would have on cardiovascular risk. Will refer to PREP program in [place].    6. Disposition: Wants to follow-up locally in [place]. Follow-up with Dr. Dellia Cloud in 6 months or sooner if anything changes. Will refill his cardiac medications today per his request.       Medication Adjustments/Labs and Tests Ordered: Current medicines are reviewed at length with the patient today.  Concerns regarding medicines are outlined above.  Orders Placed This Encounter    Reproductive/Obstetrics negative OB ROS                             Anesthesia Physical Anesthesia Plan  ASA: 3  Anesthesia Plan: General   Post-op Pain Management: Minimal or no pain anticipated   Induction: Intravenous  PONV Risk Score and Plan: Propofol infusion  Airway Management Planned: Nasal Cannula and Natural Airway  Additional Equipment:   Intra-op Plan:   Post-operative Plan:   Informed Consent: I have reviewed the patients History and Physical, chart, labs and discussed the procedure including the risks, benefits and alternatives for the proposed anesthesia  with the patient or authorized representative who has indicated his/her understanding and acceptance.     Dental advisory given  Plan Discussed with: CRNA and Surgeon  Anesthesia Plan Comments:         Anesthesia Quick Evaluation

## 2022-06-21 NOTE — Transfer of Care (Signed)
Immediate Anesthesia Transfer of Care Note  Patient: Mike Davenport  Procedure(s) Performed: COLONOSCOPY WITH PROPOFOL POLYPECTOMY  Patient Location: Short Stay  Anesthesia Type:General  Level of Consciousness: awake  Airway & Oxygen Therapy: Patient Spontanous Breathing  Post-op Assessment: Report given to RN  Post vital signs: Reviewed and stable  Last Vitals:  Vitals Value Taken Time  BP    Temp    Pulse    Resp    SpO2      Last Pain:  Vitals:   06/21/22 0732  TempSrc:   PainSc: 8       Patients Stated Pain Goal: 2 ([phone] [id number])  Complications: No notable events documented.

## 2022-06-22 DIAGNOSIS — N451 Epididymitis: Secondary | ICD-10-CM | POA: Diagnosis not present

## 2022-06-22 DIAGNOSIS — Z79899 Other long term (current) drug therapy: Secondary | ICD-10-CM | POA: Diagnosis not present

## 2022-06-22 DIAGNOSIS — Z87891 Personal history of nicotine dependence: Secondary | ICD-10-CM | POA: Diagnosis not present

## 2022-06-23 LAB — SURGICAL PATHOLOGY

## 2022-06-29 ENCOUNTER — Encounter (HOSPITAL_COMMUNITY): Payer: Self-pay | Admitting: Internal Medicine

## 2022-07-02 NOTE — Telephone Encounter (Signed)
Provider updated in chart

## 2022-07-09 DIAGNOSIS — I252 Old myocardial infarction: Secondary | ICD-10-CM | POA: Diagnosis not present

## 2022-07-09 DIAGNOSIS — G4733 Obstructive sleep apnea (adult) (pediatric): Secondary | ICD-10-CM | POA: Diagnosis not present

## 2022-07-09 DIAGNOSIS — R609 Edema, unspecified: Secondary | ICD-10-CM | POA: Diagnosis not present

## 2022-07-09 DIAGNOSIS — I10 Essential (primary) hypertension: Secondary | ICD-10-CM | POA: Diagnosis not present

## 2022-07-09 DIAGNOSIS — E039 Hypothyroidism, unspecified: Secondary | ICD-10-CM | POA: Diagnosis not present

## 2022-07-28 DIAGNOSIS — E039 Hypothyroidism, unspecified: Secondary | ICD-10-CM | POA: Diagnosis not present

## 2022-09-09 DIAGNOSIS — I270 Primary pulmonary hypertension: Secondary | ICD-10-CM | POA: Diagnosis not present

## 2022-09-09 DIAGNOSIS — J449 Chronic obstructive pulmonary disease, unspecified: Secondary | ICD-10-CM | POA: Diagnosis not present

## 2022-09-09 DIAGNOSIS — G4733 Obstructive sleep apnea (adult) (pediatric): Secondary | ICD-10-CM | POA: Diagnosis not present

## 2022-09-10 DIAGNOSIS — E039 Hypothyroidism, unspecified: Secondary | ICD-10-CM | POA: Diagnosis not present

## 2022-10-15 DIAGNOSIS — E039 Hypothyroidism, unspecified: Secondary | ICD-10-CM | POA: Diagnosis not present

## 2022-10-15 DIAGNOSIS — G4733 Obstructive sleep apnea (adult) (pediatric): Secondary | ICD-10-CM | POA: Diagnosis not present

## 2022-10-15 DIAGNOSIS — I252 Old myocardial infarction: Secondary | ICD-10-CM | POA: Diagnosis not present

## 2022-10-15 DIAGNOSIS — R609 Edema, unspecified: Secondary | ICD-10-CM | POA: Diagnosis not present

## 2022-10-15 DIAGNOSIS — I10 Essential (primary) hypertension: Secondary | ICD-10-CM | POA: Diagnosis not present

## 2022-10-31 DIAGNOSIS — I10 Essential (primary) hypertension: Secondary | ICD-10-CM | POA: Diagnosis not present

## 2022-10-31 DIAGNOSIS — G4733 Obstructive sleep apnea (adult) (pediatric): Secondary | ICD-10-CM | POA: Diagnosis not present

## 2022-11-22 ENCOUNTER — Ambulatory Visit: Payer: 59 | Admitting: Internal Medicine

## 2022-11-25 DIAGNOSIS — L03115 Cellulitis of right lower limb: Secondary | ICD-10-CM | POA: Diagnosis not present

## 2022-11-25 DIAGNOSIS — Z87891 Personal history of nicotine dependence: Secondary | ICD-10-CM | POA: Diagnosis not present

## 2022-11-25 DIAGNOSIS — I10 Essential (primary) hypertension: Secondary | ICD-10-CM | POA: Diagnosis not present

## 2022-11-25 DIAGNOSIS — L03116 Cellulitis of left lower limb: Secondary | ICD-10-CM | POA: Diagnosis not present

## 2022-11-25 DIAGNOSIS — I2510 Atherosclerotic heart disease of native coronary artery without angina pectoris: Secondary | ICD-10-CM | POA: Diagnosis not present

## 2022-11-25 DIAGNOSIS — Z86718 Personal history of other venous thrombosis and embolism: Secondary | ICD-10-CM | POA: Diagnosis not present

## 2022-11-25 DIAGNOSIS — K219 Gastro-esophageal reflux disease without esophagitis: Secondary | ICD-10-CM | POA: Diagnosis not present

## 2022-11-25 DIAGNOSIS — L02416 Cutaneous abscess of left lower limb: Secondary | ICD-10-CM | POA: Diagnosis not present

## 2022-12-03 DIAGNOSIS — G4733 Obstructive sleep apnea (adult) (pediatric): Secondary | ICD-10-CM | POA: Diagnosis not present

## 2022-12-03 DIAGNOSIS — I10 Essential (primary) hypertension: Secondary | ICD-10-CM | POA: Diagnosis not present

## 2022-12-03 DIAGNOSIS — E039 Hypothyroidism, unspecified: Secondary | ICD-10-CM | POA: Diagnosis not present

## 2022-12-03 DIAGNOSIS — R609 Edema, unspecified: Secondary | ICD-10-CM | POA: Diagnosis not present

## 2022-12-03 DIAGNOSIS — Z1389 Encounter for screening for other disorder: Secondary | ICD-10-CM | POA: Diagnosis not present

## 2022-12-03 DIAGNOSIS — I252 Old myocardial infarction: Secondary | ICD-10-CM | POA: Diagnosis not present

## 2022-12-05 IMAGING — CT CT MAXILLOFACIAL W/O CM
3 series · 16 of 47 positions shown, 19 images · non-contrast
Comparison: None.

CLINICAL DATA: Fall

EXAM:
CT MAXILLOFACIAL WITHOUT CONTRAST
TECHNIQUE: Multidetector CT imaging of the maxillofacial structures was
performed. Multiplanar CT image reconstructions were also generated.

[Series 3: max soft · axial · 0.36mm/px · z∈[-214,-54]mm · 10 of 94 slices shown, 13 images]
[im 7/94  brain]
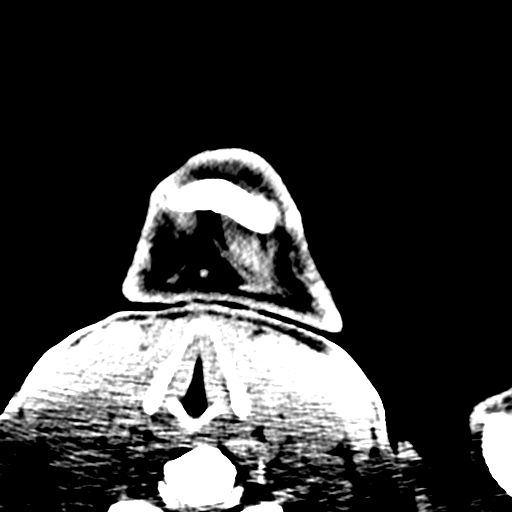
[im 7/94  bone]
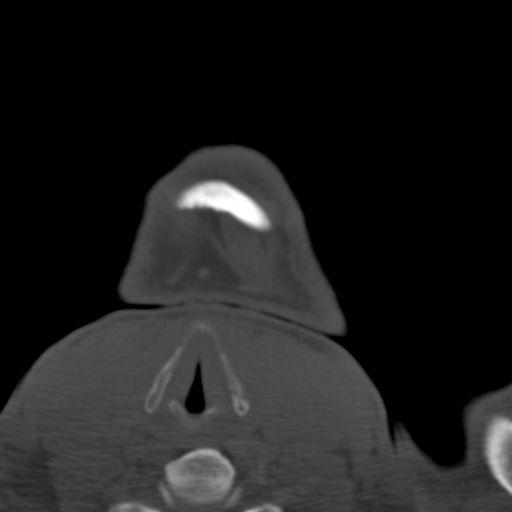
[im 17/94  bone]
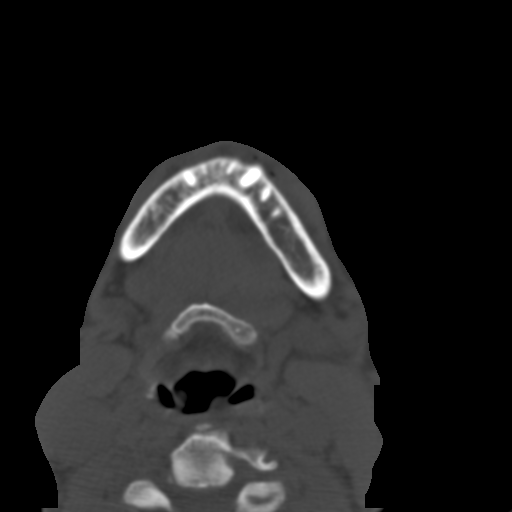
[im 26/94  bone]
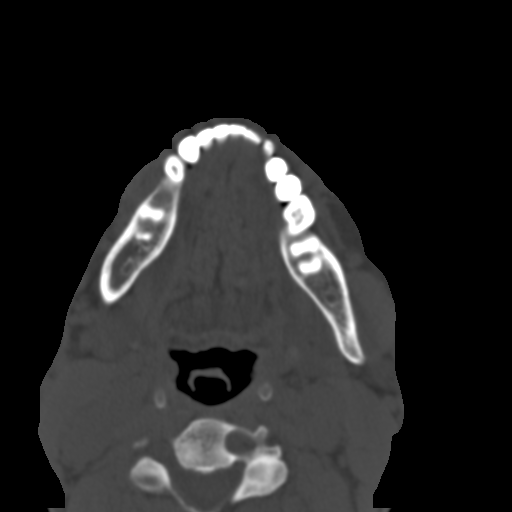
[im 33/94  bone]
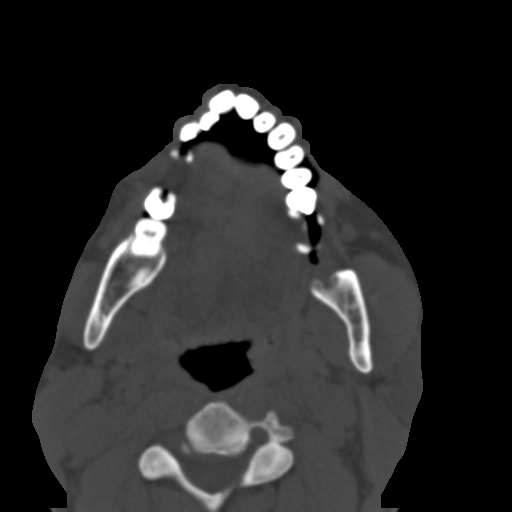
[im 42/94  brain]
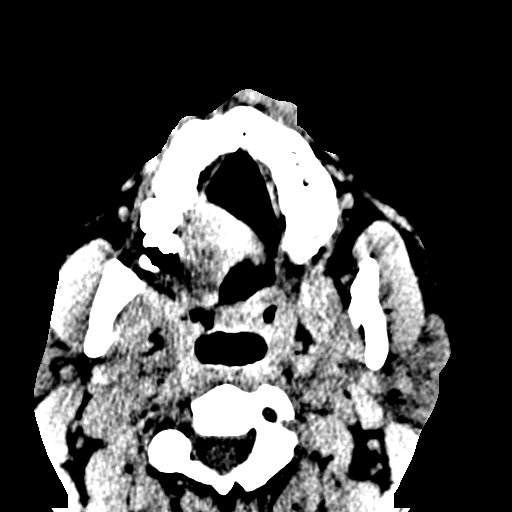
[im 42/94  bone]
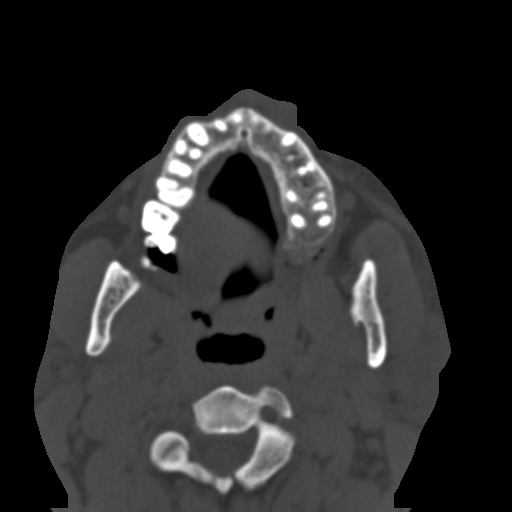
[im 52/94  bone]
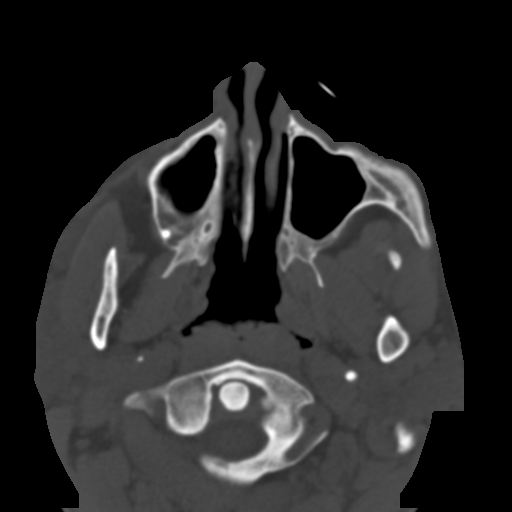
[im 61/94  bone]
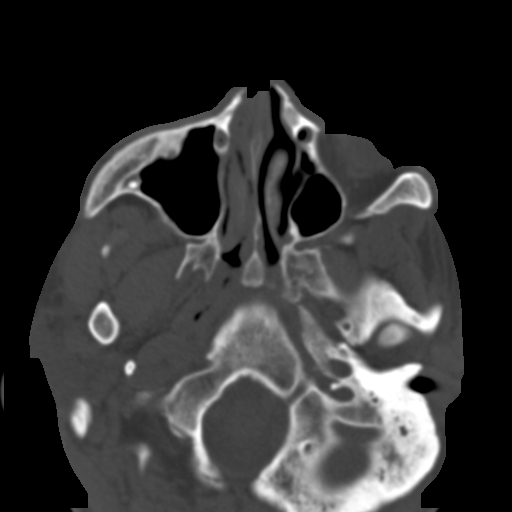
[im 71/94  bone]
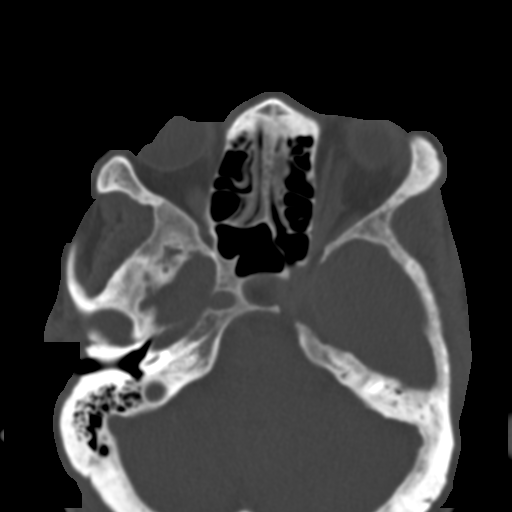
[im 77/94  brain]
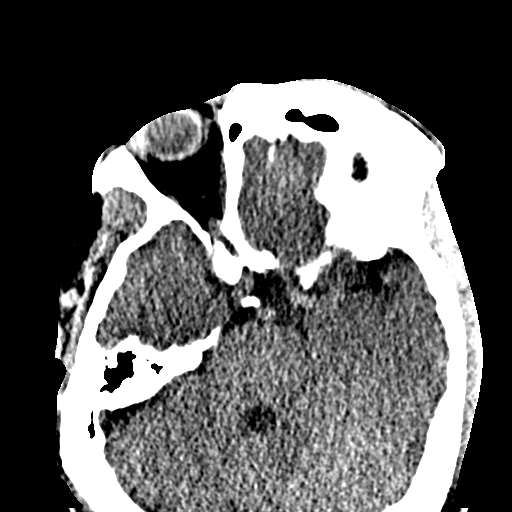
[im 77/94  bone]
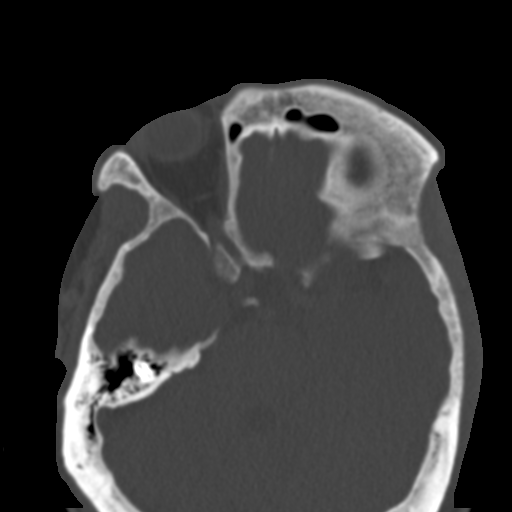
[im 87/94  bone]
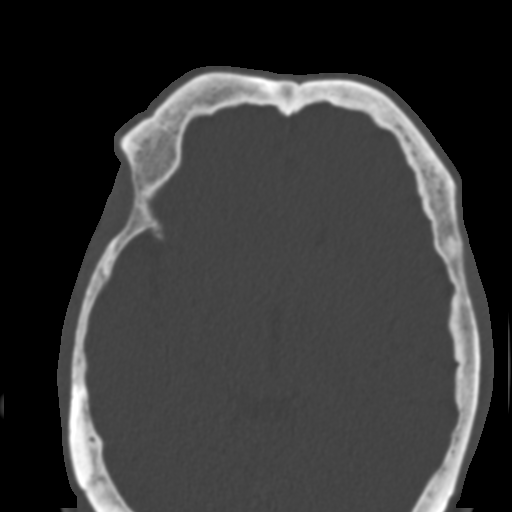

[Series 5: coronal soft · coronal · 0.32mm/px · 3 of 71 slices shown]
[im 24/71  bone]
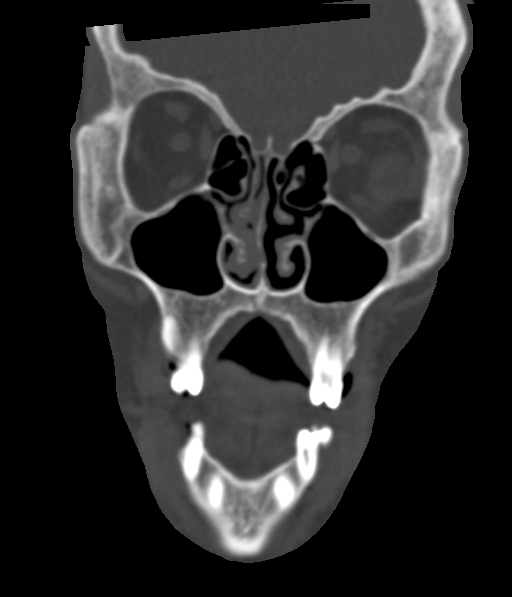
[im 32/71  bone]
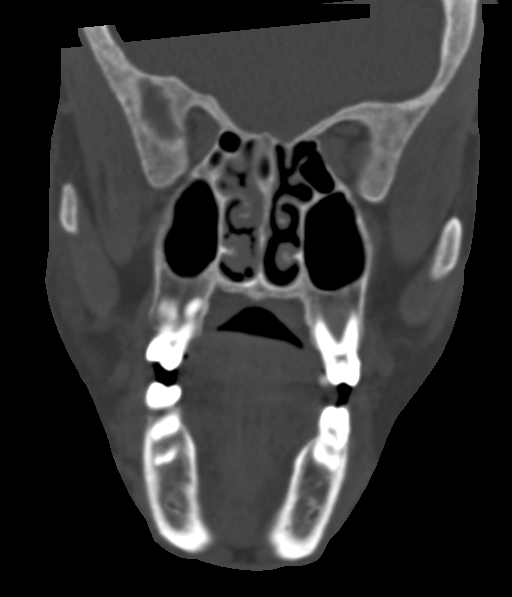
[im 39/71  bone]
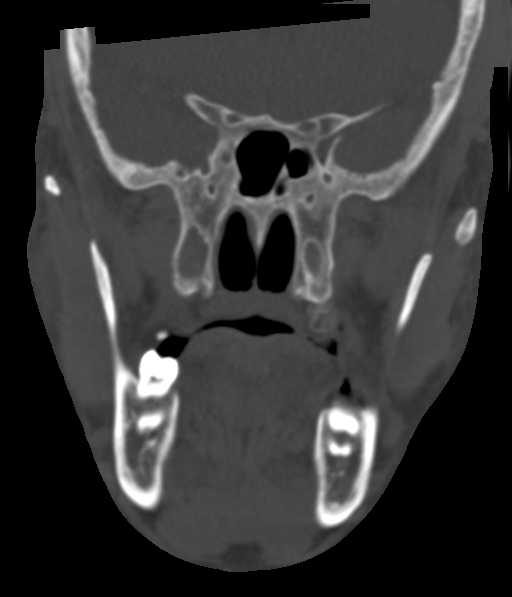

[Series 6: sagittal soft · sagittal · 0.37mm/px · 3 of 81 slices shown]
[im 27/81  bone]
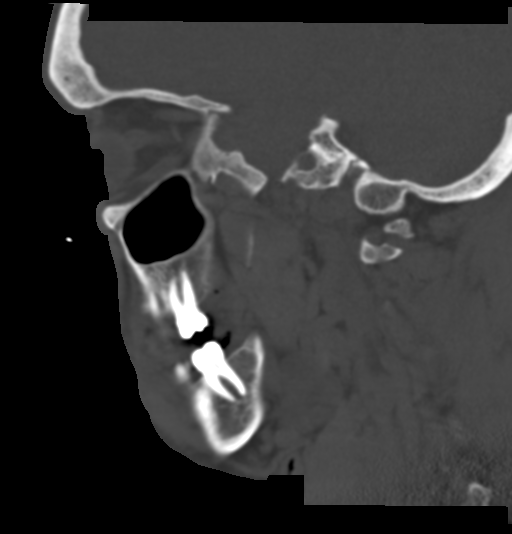
[im 41/81  bone]
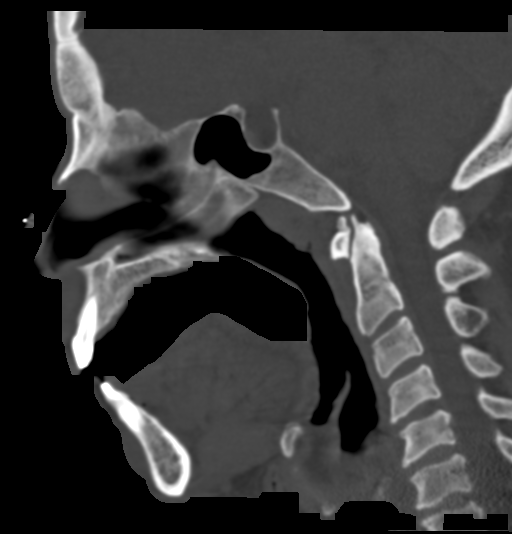
[im 54/81  bone]
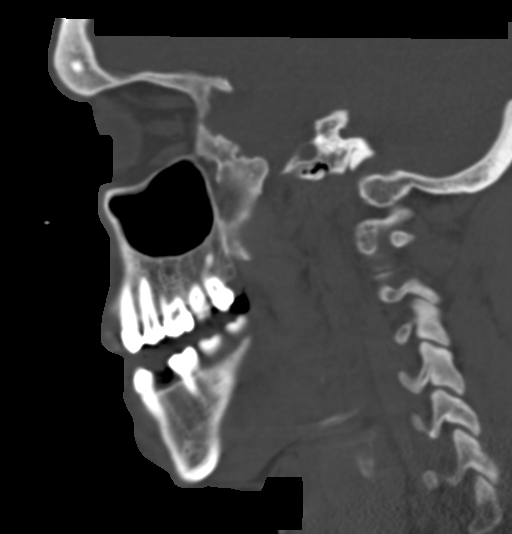

[16 of 47 positions shown; findings below may reference images not displayed]

FINDINGS: Osseous: No acute facial fracture. Temporomandibular joints are
unremarkable. Minor degenerative changes of the upper cervical
spine.

Orbits: No intraorbital hematoma.

Sinuses: No significant opacification.

Soft tissues: No significant abnormality.

Limited intracranial: No acute abnormality.
IMPRESSION: No acute facial fracture.

## 2022-12-21 ENCOUNTER — Ambulatory Visit: Payer: 59 | Attending: Internal Medicine | Admitting: Internal Medicine

## 2022-12-21 ENCOUNTER — Encounter: Payer: Self-pay | Admitting: Internal Medicine

## 2022-12-21 VITALS — BP 118/90 | HR 55 | Ht 70.0 in | Wt 367.0 lb

## 2022-12-21 DIAGNOSIS — I351 Nonrheumatic aortic (valve) insufficiency: Secondary | ICD-10-CM

## 2022-12-21 DIAGNOSIS — I77819 Aortic ectasia, unspecified site: Secondary | ICD-10-CM | POA: Diagnosis not present

## 2022-12-21 DIAGNOSIS — E890 Postprocedural hypothyroidism: Secondary | ICD-10-CM | POA: Diagnosis not present

## 2022-12-21 DIAGNOSIS — E039 Hypothyroidism, unspecified: Secondary | ICD-10-CM | POA: Insufficient documentation

## 2022-12-21 DIAGNOSIS — I10 Essential (primary) hypertension: Secondary | ICD-10-CM

## 2022-12-21 NOTE — Progress Notes (Signed)
Cardiology Office Note  Date: 12/21/2022   ID: AUL DONABEDIAN, DOB March 10, 1971, MRN [id number]  PCP:  Donetta Potts, MD  Cardiologist:  Marjo Bicker, MD Electrophysiologist:  None   History of Present Illness: Mike Davenport is a 52 y.o. male known to have CAD manifested by NSTEMI 2016 at [facility] on medical management (ostial diagonal not amenable to PCI) with nonobstructive CAD in the remaining vessels, HTN, HLD, Graves' disease s/p RAI ablation c/w hypothyroidism, morbid obesity is here for follow-up visit.   He had NSTEMI 2016 at [facility], found to have ostial diagonal which was not amenable to PCI.  He had nonobstructive CAD in the remaining vessels.  Underwent NM stress test in 2018 with anterolateral ischemia.  He was managed medically.  He underwent third stress test in 2022 for preop evaluation that showed no reversible ischemia, small fixed defect in the apical lateral and mid anterolateral segments.  Nuclear LVEF was down but  echo showed normal LVEF.  Echo from 2022 showed normal LVEF, G1 DD, mild AR, dilated aortic root and ascending aorta measuring 3.8 and 4 cm respectively.  Past Medical History:  Diagnosis Date   Carpal tunnel syndrome    Condylomata acuminata in male    Coronary artery disease    Diverticulitis    GERD (gastroesophageal reflux disease)    Gout    Heart attack (HCC)    Hyperlipidemia    Hypertension    Hypothyroidism    Knee pain, bilateral    Legal blindness    Lumbar degenerative disc disease    Myocardial infarction (HCC) 11/2014   Obesity    OSA (obstructive sleep apnea)    Seizures (HCC)    from accident; but on no meds and no more seizures since then.   Thyroid disease    Tobacco abuse    Vitamin D deficiency     Past Surgical History:  Procedure Laterality Date   BIOPSY  09/27/2016   Procedure: BIOPSY;  Surgeon: West Bali, MD;  [facility];  Service: Endoscopy;;  esophagus and gastric    COLONOSCOPY N/A 01/24/2017   Procedure: COLONOSCOPY;  Surgeon: West Bali, MD;  [facility];  Service: Endoscopy;  Laterality: N/A;  1:00pm   COLONOSCOPY WITH PROPOFOL N/A 06/21/2022   Procedure: COLONOSCOPY WITH PROPOFOL;  Surgeon: Lanelle Bal, DO;  [facility];  Service: Endoscopy;  Laterality: N/A;  8:00am, asa 3   CORONARY ANGIOPLASTY  2016   ESOPHAGOGASTRODUODENOSCOPY N/A 09/27/2016   Procedure: ESOPHAGOGASTRODUODENOSCOPY (EGD);  Surgeon: West Bali, MD;  [facility];  Service: Endoscopy;  Laterality: N/A;  300   KNEE SURGERY     bilateral knee surgery for intrinsic knee disease and leg discrepency >25 years ago per pt.    PARTIAL COLECTOMY N/A 06/27/2017   Procedure: PARTIAL COLECTOMY;  Surgeon: Franky Macho, MD;  [facility];  Service: General;  Laterality: N/A;   POLYPECTOMY  06/21/2022   Procedure: POLYPECTOMY;  Surgeon: Lanelle Bal, DO;  [facility];  Service: Endoscopy;;   SAVORY DILATION N/A 09/27/2016   Procedure: Gaspar Bidding DILATION;  Surgeon: West Bali, MD;  [facility];  Service: Endoscopy;  Laterality: N/A;   SHOULDER ARTHROSCOPY WITH ROTATOR CUFF REPAIR AND SUBACROMIAL DECOMPRESSION Left 12/05/2020   Procedure: SHOULDER ARTHROSCOPY WITH ROTATOR CUFF REPAIR AND SUBACROMIAL DECOMPRESSION and open distal claivcle resection;  Surgeon: Beverely Low, MD;  Location: [facility];  Service: Orthopedics;  Laterality: Left;  with interscalene block    Current Outpatient Medications  Medication Sig Dispense Refill   allopurinol (ZYLOPRIM) 100 MG tablet Take 100 mg by mouth daily.     aspirin EC 81 MG tablet Take 81 mg by mouth daily.     cholecalciferol (VITAMIN D) 25 MCG (1000 UNIT) tablet Take 1,000 Units by mouth daily.     fluticasone (FLONASE) 50 MCG/ACT nasal spray Place 2 sprays into both nostrils daily.     furosemide (LASIX) 80 MG tablet Take 1 tablet (80 mg total) by mouth as needed for fluid or  edema. 30 tablet 1   isosorbide mononitrate (IMDUR) 60 MG 24 hr tablet Take 1 tablet (60 mg total) by mouth daily. 90 tablet 3   KLOR-CON M20 20 MEQ tablet Take 1 tablet (20 mEq total) by mouth every other day. 45 tablet 3   levothyroxine (SYNTHROID) 50 MCG tablet Take 50 mcg by mouth daily.     metoprolol tartrate (LOPRESSOR) 100 MG tablet Take 1 tablet (100 mg total) by mouth 2 (two) times daily. 180 tablet 3   nitroGLYCERIN (NITROSTAT) 0.4 MG SL tablet Place 1 tablet (0.4 mg total) under the tongue every 5 (five) minutes as needed for chest pain. 25 tablet 3   NON FORMULARY Place 1 each into the nose at bedtime. CPAP     pantoprazole (PROTONIX) 40 MG tablet TAKE 1 TABLET BY MOUTH EVERY DAY (Patient taking differently: Take 40 mg by mouth daily.) 90 tablet 3   rosuvastatin (CRESTOR) 10 MG tablet Take 1 tablet (10 mg total) by mouth daily. 90 tablet 3   No current facility-administered medications for this visit.   Allergies:  Other   Social History: The patient  reports that he quit smoking about 8 years ago. His smoking use included cigarettes. He has never used smokeless tobacco. He reports that he does not drink alcohol and does not use drugs.   Family History: The patient's family history includes Cancer in his maternal grandmother; High blood pressure in his mother.   ROS:  Please see the history of present illness. Otherwise, complete review of systems is positive for none  All other systems are reviewed and negative.   Physical Exam: VS:  There were no vitals taken for this visit., BMI There is no height or weight on file to calculate BMI.  Wt Readings from Last 3 Encounters:  06/21/22 (!) 367 lb (166.5 kg)  06/17/22 (!) (P) 369 lb 7.9 oz (167.6 kg)  05/20/22 (!) 369 lb 8 oz (167.6 kg)    General: Patient appears comfortable at rest. HEENT: Conjunctiva and lids normal, oropharynx clear with moist mucosa. Neck: Supple, no elevated JVP or carotid bruits, no thyromegaly. Lungs:  Clear to auscultation, nonlabored breathing at rest. Cardiac: Regular rate and rhythm, no S3 or significant systolic murmur, no pericardial rub. Abdomen: Soft, nontender, no hepatomegaly, bowel sounds present, no guarding or rebound. Extremities: No pitting edema, distal pulses 2+. Skin: Warm and dry. Musculoskeletal: No kyphosis. Neuropsychiatric: Alert and oriented x3, affect grossly appropriate.  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  Repeat lipid panel from 2023 December that showed LDL 69 and normal TG.  Continue cardioprotective medications, aspirin 81 mg once daily and rosuvastatin 10 mg nightly.  He stopped taking levothyroxine around 1 month ago, instructed him to follow with PCP and endocrinology ASAP.  Due to morbid obesity with BMI 52.66 and CAD on medical management, he will benefit from initiation of Wegovy but it safe to start [facility] after his TSH is normal. Can be started by PCP or by me. -Echo from 2022 showed normal LVEF, G1 DD, mild AR, dilated aortic root and ascending aorta measuring 3.8 and 4 cm respectively.  Will repeat echocardiogram and obtain CTA aorta in 1 year. -Continue current antihypertensives, Imdur 60 mg once daily and metoprolol tartrate 100 mg  twice daily.      Medication Adjustments/Labs and Tests Ordered: Current medicines are reviewed at length with the patient today.  Concerns regarding medicines are outlined above.    Disposition:  Follow up  6 months  Signed Franki Stemen Verne Spurr, MD, 12/21/2022 12:57 PM    [facility] at [facility] [address] [place], [place], [place] [address]

## 2022-12-21 NOTE — Patient Instructions (Addendum)

## 2022-12-31 DIAGNOSIS — Q249 Congenital malformation of heart, unspecified: Secondary | ICD-10-CM | POA: Diagnosis not present

## 2022-12-31 DIAGNOSIS — J449 Chronic obstructive pulmonary disease, unspecified: Secondary | ICD-10-CM | POA: Diagnosis not present

## 2022-12-31 DIAGNOSIS — G4733 Obstructive sleep apnea (adult) (pediatric): Secondary | ICD-10-CM | POA: Diagnosis not present

## 2022-12-31 DIAGNOSIS — I270 Primary pulmonary hypertension: Secondary | ICD-10-CM | POA: Diagnosis not present

## 2023-02-25 DIAGNOSIS — Q249 Congenital malformation of heart, unspecified: Secondary | ICD-10-CM | POA: Diagnosis not present

## 2023-02-25 DIAGNOSIS — Z91018 Allergy to other foods: Secondary | ICD-10-CM | POA: Diagnosis not present

## 2023-02-25 DIAGNOSIS — M545 Low back pain, unspecified: Secondary | ICD-10-CM | POA: Diagnosis not present

## 2023-02-25 DIAGNOSIS — I2510 Atherosclerotic heart disease of native coronary artery without angina pectoris: Secondary | ICD-10-CM | POA: Diagnosis not present

## 2023-02-25 DIAGNOSIS — G4733 Obstructive sleep apnea (adult) (pediatric): Secondary | ICD-10-CM | POA: Diagnosis not present

## 2023-02-25 DIAGNOSIS — M47816 Spondylosis without myelopathy or radiculopathy, lumbar region: Secondary | ICD-10-CM | POA: Diagnosis not present

## 2023-02-25 DIAGNOSIS — I270 Primary pulmonary hypertension: Secondary | ICD-10-CM | POA: Diagnosis not present

## 2023-02-25 DIAGNOSIS — Z87891 Personal history of nicotine dependence: Secondary | ICD-10-CM | POA: Diagnosis not present

## 2023-02-25 DIAGNOSIS — Z7902 Long term (current) use of antithrombotics/antiplatelets: Secondary | ICD-10-CM | POA: Diagnosis not present

## 2023-02-25 DIAGNOSIS — G8929 Other chronic pain: Secondary | ICD-10-CM | POA: Diagnosis not present

## 2023-02-25 DIAGNOSIS — Z79899 Other long term (current) drug therapy: Secondary | ICD-10-CM | POA: Diagnosis not present

## 2023-02-25 DIAGNOSIS — J449 Chronic obstructive pulmonary disease, unspecified: Secondary | ICD-10-CM | POA: Diagnosis not present

## 2023-02-25 DIAGNOSIS — I10 Essential (primary) hypertension: Secondary | ICD-10-CM | POA: Diagnosis not present

## 2023-03-03 DIAGNOSIS — G4733 Obstructive sleep apnea (adult) (pediatric): Secondary | ICD-10-CM | POA: Diagnosis not present

## 2023-03-03 DIAGNOSIS — G8929 Other chronic pain: Secondary | ICD-10-CM | POA: Diagnosis not present

## 2023-03-03 DIAGNOSIS — M549 Dorsalgia, unspecified: Secondary | ICD-10-CM | POA: Diagnosis not present

## 2023-03-09 DIAGNOSIS — M545 Low back pain, unspecified: Secondary | ICD-10-CM | POA: Diagnosis not present

## 2023-03-16 ENCOUNTER — Other Ambulatory Visit: Payer: 59

## 2023-03-16 VITALS — BP 124/78 | HR 80

## 2023-03-16 DIAGNOSIS — R972 Elevated prostate specific antigen [PSA]: Secondary | ICD-10-CM

## 2023-03-17 LAB — PSA: Prostate Specific Ag, Serum: 1.7 ng/mL (ref 0.0–4.0)

## 2023-03-30 DIAGNOSIS — M5136 Other intervertebral disc degeneration, lumbar region with discogenic back pain only: Secondary | ICD-10-CM | POA: Diagnosis not present

## 2023-04-01 ENCOUNTER — Ambulatory Visit (INDEPENDENT_AMBULATORY_CARE_PROVIDER_SITE_OTHER): Payer: 59 | Admitting: Urology

## 2023-04-01 VITALS — BP 168/116 | HR 90 | Ht 70.0 in | Wt 367.0 lb

## 2023-04-01 DIAGNOSIS — I270 Primary pulmonary hypertension: Secondary | ICD-10-CM | POA: Diagnosis not present

## 2023-04-01 DIAGNOSIS — R972 Elevated prostate specific antigen [PSA]: Secondary | ICD-10-CM

## 2023-04-01 DIAGNOSIS — G4733 Obstructive sleep apnea (adult) (pediatric): Secondary | ICD-10-CM | POA: Diagnosis not present

## 2023-04-01 DIAGNOSIS — Q249 Congenital malformation of heart, unspecified: Secondary | ICD-10-CM | POA: Diagnosis not present

## 2023-04-01 DIAGNOSIS — J449 Chronic obstructive pulmonary disease, unspecified: Secondary | ICD-10-CM | POA: Diagnosis not present

## 2023-04-01 LAB — URINALYSIS, ROUTINE W REFLEX MICROSCOPIC
Bilirubin, UA: NEGATIVE
Glucose, UA: NEGATIVE
Ketones, UA: NEGATIVE
Leukocytes,UA: NEGATIVE
Nitrite, UA: NEGATIVE
Specific Gravity, UA: 1.03 (ref 1.005–1.030)
Urobilinogen, Ur: 2 mg/dL — ABNORMAL HIGH (ref 0.2–1.0)
pH, UA: 6 (ref 5.0–7.5)

## 2023-04-01 LAB — MICROSCOPIC EXAMINATION

## 2023-04-01 NOTE — Progress Notes (Unsigned)
04/01/2023 12:38 PM   Mike Davenport 13-Jun-1970 [id number]  Referring provider: Donetta Potts, MD [address] [place],  [place] [address]  Followup elevated PSA   HPI: [name] is a 52yo here for followup for elevated PSA. PSA stable at 1.7. IPSS 5 QOL 1 on no therapy. Urine stream strong. NO straining to urinate. Nocturia 1-2x. No other complaints today   PMH: Past Medical History:  Diagnosis Date   Carpal tunnel syndrome    Condylomata acuminata in male    Coronary artery disease    Diverticulitis    GERD (gastroesophageal reflux disease)    Gout    Heart attack (HCC)    Hyperlipidemia    Hypertension    Hypothyroidism    Knee pain, bilateral    Legal blindness    Lumbar degenerative disc disease    Myocardial infarction (HCC) 11/2014   Obesity    OSA (obstructive sleep apnea)    Seizures (HCC)    from accident; but on no meds and no more seizures since then.   Thyroid disease    Tobacco abuse    Vitamin D deficiency     Surgical History: Past Surgical History:  Procedure Laterality Date   BIOPSY  09/27/2016   Procedure: BIOPSY;  Surgeon: Mike Bali, MD;  [facility];  Service: Endoscopy;;  esophagus and gastric   COLONOSCOPY N/A 01/24/2017   Procedure: COLONOSCOPY;  Surgeon: Mike Bali, MD;  [facility];  Service: Endoscopy;  Laterality: N/A;  1:00pm   COLONOSCOPY WITH PROPOFOL N/A 06/21/2022   Procedure: COLONOSCOPY WITH PROPOFOL;  Surgeon: Mike Bal, DO;  [facility];  Service: Endoscopy;  Laterality: N/A;  8:00am, asa 3   CORONARY ANGIOPLASTY  2016   ESOPHAGOGASTRODUODENOSCOPY N/A 09/27/2016   Procedure: ESOPHAGOGASTRODUODENOSCOPY (EGD);  Surgeon: Mike Bali, MD;  [facility];  Service: Endoscopy;  Laterality: N/A;  300   KNEE SURGERY     bilateral knee surgery for intrinsic knee disease and leg discrepency >25 years ago per pt.    PARTIAL COLECTOMY N/A 06/27/2017   Procedure:  PARTIAL COLECTOMY;  Surgeon: Mike Macho, MD;  [facility];  Service: General;  Laterality: N/A;   POLYPECTOMY  06/21/2022   Procedure: POLYPECTOMY;  Surgeon: Mike Bal, DO;  [facility];  Service: Endoscopy;;   SAVORY DILATION N/A 09/27/2016   Procedure: Mike Davenport DILATION;  Surgeon: Mike Bali, MD;  [facility];  Service: Endoscopy;  Laterality: N/A;   SHOULDER ARTHROSCOPY WITH ROTATOR CUFF REPAIR AND SUBACROMIAL DECOMPRESSION Left 12/05/2020   Procedure: SHOULDER ARTHROSCOPY WITH ROTATOR CUFF REPAIR AND SUBACROMIAL DECOMPRESSION and open distal claivcle resection;  Surgeon: Mike Low, MD;  [facility];  Service: Orthopedics;  Laterality: Left;  with interscalene block    Home Medications:  Allergies as of 04/01/2023       Reactions   Other Anaphylaxis   [place] nuts        Medication List        Accurate as of April 01, 2023 12:38 PM. If you have any questions, ask your nurse or doctor.          allopurinol 100 MG tablet Commonly known as: ZYLOPRIM Take 100 mg by mouth daily.   aspirin EC 81 MG tablet Take 81 mg by mouth daily.   cholecalciferol 25 MCG (1000 UNIT) tablet Commonly known as: VITAMIN D3 Take 1,000 Units by mouth daily.  fluticasone 50 MCG/ACT nasal spray Commonly known as: FLONASE Place 2 sprays into both nostrils daily.   furosemide 80 MG tablet Commonly known as: LASIX Take 1 tablet (80 mg total) by mouth as needed for fluid or edema.   isosorbide mononitrate 60 MG 24 hr tablet Commonly known as: IMDUR Take 1 tablet (60 mg total) by mouth daily.   Klor-Con M20 20 MEQ tablet Generic drug: potassium chloride SA Take 1 tablet (20 mEq total) by mouth every other day.   levothyroxine 50 MCG tablet Commonly known as: SYNTHROID Take 50 mcg by mouth daily.   metoprolol tartrate 100 MG tablet Commonly known as: LOPRESSOR Take 1 tablet (100 mg total) by mouth 2 (two) times daily.   nitroGLYCERIN  0.4 MG SL tablet Commonly known as: NITROSTAT Place 1 tablet (0.4 mg total) under the tongue every 5 (five) minutes as needed for chest pain.   NON FORMULARY Place 1 each into the nose at bedtime. CPAP   pantoprazole 40 MG tablet Commonly known as: PROTONIX TAKE 1 TABLET BY MOUTH EVERY DAY   rosuvastatin 10 MG tablet Commonly known as: CRESTOR Take 1 tablet (10 mg total) by mouth daily.        Allergies:  Allergies  Allergen Reactions   Other Anaphylaxis    [place] nuts    Family History: Family History  Problem Relation Age of Onset   High blood pressure Mother    Cancer Maternal Grandmother    Colon cancer Neg Hx     Social History:  reports that he quit smoking about 8 years ago. His smoking use included cigarettes. He has never used smokeless tobacco. He reports that he does not drink alcohol and does not use drugs.  ROS: All other review of systems were reviewed and are negative except what is noted above in HPI  Physical Exam: BP (!) 168/116   Pulse 90   Ht 5\' 10"  (1.778 m)   Wt (!) 367 lb (166.5 kg)   BMI 52.66 kg/m   Constitutional:  Alert and oriented, No acute distress. HEENT: [place] AT, moist mucus membranes.  Trachea midline, no masses. Cardiovascular: No clubbing, cyanosis, or edema. Respiratory: Normal respiratory effort, no increased work of breathing. GI: Abdomen is soft, nontender, nondistended, no abdominal masses GU: No CVA tenderness.  Lymph: No cervical or inguinal lymphadenopathy. Skin: No rashes, bruises or suspicious lesions. Neurologic: Grossly intact, no focal deficits, moving all 4 extremities. Psychiatric: Normal mood and affect.  Laboratory Data: Lab Results  Component Value Date   WBC 5.4 11/28/2020   HGB 15.3 11/28/2020   HCT 47.0 11/28/2020   MCV 86.9 11/28/2020   PLT 150 11/28/2020    Lab Results  Component Value Date   CREATININE 1.24 06/17/2022    Lab Results  Component Value Date   PSA 1.2 09/15/2021    No  results found for: "TESTOSTERONE"  Lab Results  Component Value Date   HGBA1C 5.4 09/15/2021    Urinalysis    Component Value Date/Time   APPEARANCEUR Cloudy (A) 03/16/2022 1200   GLUCOSEU Negative 03/16/2022 1200   BILIRUBINUR Negative 03/16/2022 1200   PROTEINUR 3+ (A) 03/16/2022 1200   UROBILINOGEN 1.0 02/27/2014 1624   NITRITE Negative 03/16/2022 1200   LEUKOCYTESUR Trace (A) 03/16/2022 1200    Lab Results  Component Value Date   LABMICR See below: 03/16/2022   WBCUA 6-10 (A) 03/16/2022   LABEPIT 0-10 03/16/2022   MUCUS Present 03/18/2021   BACTERIA Few 03/16/2022  Pertinent Imaging:  No results found for this or any previous visit.  No results found for this or any previous visit.  No results found for this or any previous visit.  No results found for this or any previous visit.  No results found for this or any previous visit.  No valid procedures specified. No results found for this or any previous visit.  No results found for this or any previous visit.   Assessment & Plan:    1. Elevated PSA Followup 1 year with PSA   No follow-ups on file.  Wilkie Aye, MD  [facility] [place]

## 2023-04-05 ENCOUNTER — Encounter: Payer: Self-pay | Admitting: Urology

## 2023-04-05 NOTE — Patient Instructions (Signed)

## 2023-04-06 ENCOUNTER — Other Ambulatory Visit: Payer: Self-pay | Admitting: Nurse Practitioner

## 2023-04-08 DIAGNOSIS — I270 Primary pulmonary hypertension: Secondary | ICD-10-CM | POA: Diagnosis not present

## 2023-04-08 DIAGNOSIS — G4733 Obstructive sleep apnea (adult) (pediatric): Secondary | ICD-10-CM | POA: Diagnosis not present

## 2023-04-08 DIAGNOSIS — J449 Chronic obstructive pulmonary disease, unspecified: Secondary | ICD-10-CM | POA: Diagnosis not present

## 2023-04-29 DIAGNOSIS — M47816 Spondylosis without myelopathy or radiculopathy, lumbar region: Secondary | ICD-10-CM | POA: Diagnosis not present

## 2023-04-29 DIAGNOSIS — M5136 Other intervertebral disc degeneration, lumbar region with discogenic back pain only: Secondary | ICD-10-CM | POA: Diagnosis not present

## 2023-04-29 DIAGNOSIS — M48061 Spinal stenosis, lumbar region without neurogenic claudication: Secondary | ICD-10-CM | POA: Diagnosis not present

## 2023-05-02 DIAGNOSIS — G4733 Obstructive sleep apnea (adult) (pediatric): Secondary | ICD-10-CM | POA: Diagnosis not present

## 2023-05-02 DIAGNOSIS — J449 Chronic obstructive pulmonary disease, unspecified: Secondary | ICD-10-CM | POA: Diagnosis not present

## 2023-05-02 DIAGNOSIS — Q249 Congenital malformation of heart, unspecified: Secondary | ICD-10-CM | POA: Diagnosis not present

## 2023-05-02 DIAGNOSIS — I270 Primary pulmonary hypertension: Secondary | ICD-10-CM | POA: Diagnosis not present

## 2023-05-08 ENCOUNTER — Other Ambulatory Visit: Payer: Self-pay | Admitting: Nurse Practitioner

## 2023-05-28 DIAGNOSIS — I270 Primary pulmonary hypertension: Secondary | ICD-10-CM | POA: Diagnosis not present

## 2023-05-28 DIAGNOSIS — G4733 Obstructive sleep apnea (adult) (pediatric): Secondary | ICD-10-CM | POA: Diagnosis not present

## 2023-05-28 DIAGNOSIS — J449 Chronic obstructive pulmonary disease, unspecified: Secondary | ICD-10-CM | POA: Diagnosis not present

## 2023-06-02 DIAGNOSIS — Q249 Congenital malformation of heart, unspecified: Secondary | ICD-10-CM | POA: Diagnosis not present

## 2023-06-02 DIAGNOSIS — G4733 Obstructive sleep apnea (adult) (pediatric): Secondary | ICD-10-CM | POA: Diagnosis not present

## 2023-06-02 DIAGNOSIS — I270 Primary pulmonary hypertension: Secondary | ICD-10-CM | POA: Diagnosis not present

## 2023-06-02 DIAGNOSIS — J449 Chronic obstructive pulmonary disease, unspecified: Secondary | ICD-10-CM | POA: Diagnosis not present

## 2023-06-14 ENCOUNTER — Other Ambulatory Visit: Payer: Self-pay | Admitting: Nurse Practitioner

## 2023-06-14 ENCOUNTER — Ambulatory Visit: Payer: 59 | Attending: Internal Medicine | Admitting: Internal Medicine

## 2023-06-14 NOTE — Progress Notes (Signed)
 Erroneous encounter - please disregard.

## 2023-06-16 DIAGNOSIS — Z0001 Encounter for general adult medical examination with abnormal findings: Secondary | ICD-10-CM | POA: Diagnosis not present

## 2023-09-06 ENCOUNTER — Other Ambulatory Visit: Payer: Self-pay | Admitting: Internal Medicine

## 2024-05-02 ENCOUNTER — Ambulatory Visit: Attending: Internal Medicine | Admitting: Internal Medicine

## 2024-05-02 ENCOUNTER — Encounter: Payer: Self-pay | Admitting: Internal Medicine

## 2024-05-02 ENCOUNTER — Telehealth: Payer: Self-pay

## 2024-05-02 VITALS — BP 160/100 | HR 82 | Ht 71.0 in | Wt 365.6 lb

## 2024-05-02 DIAGNOSIS — I77819 Aortic ectasia, unspecified site: Secondary | ICD-10-CM

## 2024-05-02 DIAGNOSIS — I2510 Atherosclerotic heart disease of native coronary artery without angina pectoris: Secondary | ICD-10-CM | POA: Diagnosis not present

## 2024-05-02 DIAGNOSIS — I351 Nonrheumatic aortic (valve) insufficiency: Secondary | ICD-10-CM

## 2024-05-02 DIAGNOSIS — I10 Essential (primary) hypertension: Secondary | ICD-10-CM

## 2024-05-02 MED ORDER — CARVEDILOL 12.5 MG PO TABS: 12.5000 mg | ORAL_TABLET | Freq: Two times a day (BID) | ORAL | 3 refills | Status: AC

## 2024-05-02 MED ORDER — NITROGLYCERIN 0.4 MG SL SUBL: 0.4000 mg | SUBLINGUAL_TABLET | SUBLINGUAL | 3 refills | Status: AC | PRN

## 2024-05-02 NOTE — Telephone Encounter (Signed)
 Per Dr. Mallipeddi after patient had been check out from visit:  Repeat Echo now for aorta dilatation and AR.   Called patient advised him of testing ordered after he left. Someone will contact him to schedule appointment. Patient verbalized understanding

## 2024-05-02 NOTE — Progress Notes (Signed)
 "    Cardiology Office Note  Date: 05/02/2024   ID: Mike Davenport, DOB 14-Jul-1970, MRN [id number]  PCP:  Rosalva Ade, MD  Cardiologist:  Diannah SHAUNNA Maywood, MD Electrophysiologist:  None   History of Present Illness: Mike Davenport is a 54 y.o. male known to have CAD manifested by NSTEMI 2016 at [facility] on medical management (ostial diagonal not amenable to PCI) with nonobstructive CAD in the remaining vessels, HTN, HLD, Graves' disease s/p RAI ablation c/w hypothyroidism, morbid obesity is here for follow-up visit.  He had NSTEMI 2016 at [facility], found to have ostial diagonal which was not amenable to PCI.  He had nonobstructive CAD in the remaining vessels.  Underwent NM stress test in 2018 with anterolateral ischemia.  He was managed medically.  He underwent another stress test in 2022 for preop evaluation that showed no reversible ischemia, small fixed defect in the apical lateral and mid anterolateral segments.  Nuclear LVEF was down but  echo showed normal LVEF.  Echo from 2022 showed normal LVEF, G1 DD, mild AR, dilated aortic root and ascending aorta measuring 3.8 and 4 cm respectively.  No angina.  Has some SOB chronically.  No dizziness, syncope, palpitations.  No leg swelling.  He reports having heart fluttering only when the levothyroxine  dose is changed.  Past Medical History:  Diagnosis Date   Carpal tunnel syndrome    Condylomata acuminata in male    Coronary artery disease    Diverticulitis    GERD (gastroesophageal reflux disease)    Gout    Heart attack (HCC)    Hyperlipidemia    Hypertension    Hypothyroidism    Knee pain, bilateral    Legal blindness    Lumbar degenerative disc disease    Myocardial infarction (HCC) 11/2014   Obesity    OSA (obstructive sleep apnea)    Seizures (HCC)    from accident; but on no meds and no more seizures since then.   Thyroid  disease    Tobacco abuse    Vitamin D deficiency     Past Surgical History:   Procedure Laterality Date   BIOPSY  09/27/2016   Procedure: BIOPSY;  Surgeon: Harvey Margo LITTIE, MD;  [facility];  Service: Endoscopy;;  esophagus and gastric   COLONOSCOPY N/A 01/24/2017   Procedure: COLONOSCOPY;  Surgeon: Harvey Margo LITTIE, MD;  [facility];  Service: Endoscopy;  Laterality: N/A;  1:00pm   COLONOSCOPY WITH PROPOFOL  N/A 06/21/2022   Procedure: COLONOSCOPY WITH PROPOFOL ;  Surgeon: Cindie Carlin POUR, DO;  [facility];  Service: Endoscopy;  Laterality: N/A;  8:00am, asa 3   CORONARY ANGIOPLASTY  2016   ESOPHAGOGASTRODUODENOSCOPY N/A 09/27/2016   Procedure: ESOPHAGOGASTRODUODENOSCOPY (EGD);  Surgeon: Harvey Margo LITTIE, MD;  [facility];  Service: Endoscopy;  Laterality: N/A;  300   KNEE SURGERY     bilateral knee surgery for intrinsic knee disease and leg discrepency >25 years ago per pt.    PARTIAL COLECTOMY N/A 06/27/2017   Procedure: PARTIAL COLECTOMY;  Surgeon: Mavis Anes, MD;  [facility];  Service: General;  Laterality: N/A;   POLYPECTOMY  06/21/2022   Procedure: POLYPECTOMY;  Surgeon: Cindie Carlin POUR, DO;  [facility];  Service: Endoscopy;;   SAVORY DILATION N/A 09/27/2016   Procedure: HARLEY DILATION;  Surgeon: Harvey Margo LITTIE, MD;  [facility];  Service: Endoscopy;  Laterality: N/A;   SHOULDER ARTHROSCOPY WITH ROTATOR CUFF REPAIR  AND SUBACROMIAL DECOMPRESSION Left 12/05/2020   Procedure: SHOULDER ARTHROSCOPY WITH ROTATOR CUFF REPAIR AND SUBACROMIAL DECOMPRESSION and open distal claivcle resection;  Surgeon: Kay Kemps, MD;  [facility];  Service: Orthopedics;  Laterality: Left;  with interscalene block    Current Outpatient Medications  Medication Sig Dispense Refill   allopurinol  (ZYLOPRIM ) 100 MG tablet Take 100 mg by mouth daily.     aspirin  EC 81 MG tablet Take 81 mg by mouth daily.     fluticasone (FLONASE) 50 MCG/ACT nasal spray Place 2 sprays into both nostrils daily.     furosemide   (LASIX ) 80 MG tablet Take 1 tablet (80 mg total) by mouth as needed for fluid or edema. 30 tablet 1   isosorbide  mononitrate (IMDUR ) 60 MG 24 hr tablet TAKE 1 TABLET BY MOUTH EVERY DAY 90 tablet 1   KLOR-CON  M20 20 MEQ tablet TAKE 1 TABLET (20 MEQ TOTAL) BY MOUTH EVERY OTHER DAY. (Patient taking differently: Take 20 mEq by mouth as needed.) 45 tablet 1   levothyroxine  (SYNTHROID ) 75 MCG tablet Take 75 mcg by mouth daily.     metoprolol  tartrate (LOPRESSOR ) 100 MG tablet TAKE 1 TABLET BY MOUTH TWICE DAILY 180 tablet 11   nitroGLYCERIN  (NITROSTAT ) 0.4 MG SL tablet Place 1 tablet (0.4 mg total) under the tongue every 5 (five) minutes as needed for chest pain. 25 tablet 3   NON FORMULARY Place 1 each into the nose at bedtime. CPAP     pantoprazole  (PROTONIX ) 40 MG tablet TAKE 1 TABLET BY MOUTH EVERY DAY 90 tablet 3   rosuvastatin  (CRESTOR ) 10 MG tablet Take 1 tablet (10 mg total) by mouth daily. 90 tablet 3   Vitamin D, Ergocalciferol, (DRISDOL) 1.25 MG (50000 UNIT) CAPS capsule Take 50,000 Units by mouth once a week.     No current facility-administered medications for this visit.   Allergies:  Other   Social History: The patient  reports that he quit smoking about 10 years ago. His smoking use included cigarettes. He smoked an average of 0.5 packs per day. He has never used smokeless tobacco. He reports that he does not drink alcohol and does not use drugs.   Family History: The patient's family history includes Cancer in his maternal grandmother; High blood pressure in his mother.   ROS:  Please see the history of present illness. Otherwise, complete review of systems is positive for none  All other systems are reviewed and negative.   Physical Exam: VS:  BP (!) 160/100   Pulse 82   Ht 5' 11 (1.803 m)   Wt (!) 365 lb 9.6 oz (165.8 kg)   SpO2 95%   BMI 50.99 kg/m , BMI Body mass index is 50.99 kg/m.  Wt Readings from Last 3 Encounters:  05/02/24 (!) 365 lb 9.6 oz (165.8 kg)  04/01/23  (!) 367 lb (166.5 kg)  12/21/22 (!) 367 lb (166.5 kg)    General: Patient appears comfortable at rest. HEENT: Conjunctiva and lids normal, oropharynx clear with moist mucosa. Neck: Supple, no elevated JVP or carotid bruits, no thyromegaly. Lungs: Clear to auscultation, nonlabored breathing at rest. Cardiac: Regular rate and rhythm, no S3 or significant systolic murmur, no pericardial rub. Abdomen: Soft, nontender, no hepatomegaly, bowel sounds present, no guarding or rebound. Extremities: No pitting edema, distal pulses 2+. Skin: Warm and dry. Musculoskeletal: No kyphosis. Neuropsychiatric: Alert and oriented x3, affect grossly appropriate.  Recent Labwork: No results found for requested labs within last 365 days.  Component Value Date/Time   CHOL 202 (A) 09/15/2021 0000   TRIG 102 09/15/2021 0000   HDL 50 09/15/2021 0000   CHOLHDL 4.1 04/07/2011 1456   VLDL 31 04/07/2011 1456   LDLCALC 134 09/15/2021 0000   LDLDIRECT 131 (H) 08/18/2007 2200     Assessment and Plan:   CAD manifested by NSTEMI in 2016 on medical management (ostial diagonal lesion not amenable to PCI), angina free: No angina.  Continue aspirin  81 mg once daily, rosuvastatin  10 mg nightly. Will refill SL NTG 0.4 mg as needed for chest pain.  HLD, at goal: Lipid panel reviewed from October 2025.  LDL 75, TG 114, normal limits.  Continue rosuvastatin  10 mg nightly.  Goal LDL less than 70.  HTN, poorly controlled: Home blood pressures range between 130 and 150 mmHg SBP.  Switch metoprolol  to carvedilol  12.5 mg twice daily.  Continue Imdur  60 mg once daily.    Aortic dilatation (38 mm and 40 mm) and mild AI in 2022: Repeat echo.  Morbid obesity with BMI more than 50: Heart healthy diet and exercise counseling provided.  He does not want to b GLP-1 agonist due to death in the family from the same.  OSA on CPAP: Continue CPAP.  Graves' disease s/p radioactive iodine  ablation and RAI induced hypothyroidism: On  levothyroxine , managed by his PCP.  I spent 30 minutes in reviewing prior medical records, reports, more than 3 labs, discussion and documentation.   Medication Adjustments/Labs and Tests Ordered: Current medicines are reviewed at length with the patient today.  Concerns regarding medicines are outlined above.    Disposition:  Follow up 1 year  Signed Alianys Chacko Priya Stacie Templin, MD, 05/02/2024 11:21 AM    [facility] at [facility] [address] [place], [place], [place] [address]  "

## 2024-05-02 NOTE — Patient Instructions (Signed)
 Medication Instructions:  Your physician has recommended you make the following change in your medication:  Stop taking Metoprolol   Start taking Carvedilol  12.5 mg twice daily Continue taking all other medications as prescribed  Labwork: None  Testing/Procedures: None  Follow-Up: Your physician recommends that you schedule a follow-up appointment in: 1 year. You will receive a reminder call in about 8 months reminding you to schedule your appointment. If you don't receive this call, please contact our office.   Any Other Special Instructions Will Be Listed Below (If Applicable). Thank you for choosing [facility]!     If you need a refill on your cardiac medications before your next appointment, please call your pharmacy.

## 2024-05-15 ENCOUNTER — Ambulatory Visit

## 2024-06-04 ENCOUNTER — Ambulatory Visit
